# Patient Record
Sex: Female | Born: 1940 | Race: White | Hispanic: No | Smoking: Former smoker
Health system: Southern US, Community
[De-identification: ages and names within clinical notes are randomized; demographics above are authoritative.]

## PROBLEM LIST (undated history)

## (undated) DIAGNOSIS — H269 Unspecified cataract: Secondary | ICD-10-CM

## (undated) DIAGNOSIS — I10 Essential (primary) hypertension: Secondary | ICD-10-CM

## (undated) DIAGNOSIS — H903 Sensorineural hearing loss, bilateral: Secondary | ICD-10-CM

## (undated) DIAGNOSIS — E7800 Pure hypercholesterolemia, unspecified: Secondary | ICD-10-CM

## (undated) DIAGNOSIS — N393 Stress incontinence (female) (male): Secondary | ICD-10-CM

## (undated) DIAGNOSIS — M8580 Other specified disorders of bone density and structure, unspecified site: Secondary | ICD-10-CM

## (undated) DIAGNOSIS — M810 Age-related osteoporosis without current pathological fracture: Secondary | ICD-10-CM

## (undated) DIAGNOSIS — Z8619 Personal history of other infectious and parasitic diseases: Secondary | ICD-10-CM

## (undated) HISTORY — DX: Pure hypercholesterolemia, unspecified: E78.00

## (undated) HISTORY — DX: Other specified disorders of bone density and structure, unspecified site: M85.80

## (undated) HISTORY — PX: OTHER SURGICAL HISTORY: SHX169

## (undated) HISTORY — DX: Sensorineural hearing loss, bilateral: H90.3

## (undated) HISTORY — DX: Personal history of other infectious and parasitic diseases: Z86.19

## (undated) HISTORY — DX: Stress incontinence (female) (male): N39.3

## (undated) HISTORY — PX: BASAL CELL CARCINOMA EXCISION: SHX1214

## (undated) HISTORY — DX: Essential (primary) hypertension: I10

## (undated) HISTORY — DX: Age-related osteoporosis without current pathological fracture: M81.0

## (undated) HISTORY — DX: Unspecified cataract: H26.9

## (undated) HISTORY — PX: CATARACT EXTRACTION: SUR2

## (undated) HISTORY — PX: EYE SURGERY: SHX253

---

## 1997-08-27 ENCOUNTER — Other Ambulatory Visit: Admission: RE | Admit: 1997-08-27 | Discharge: 1997-08-27 | Payer: Self-pay | Admitting: Obstetrics and Gynecology

## 1998-10-21 ENCOUNTER — Other Ambulatory Visit: Admission: RE | Admit: 1998-10-21 | Discharge: 1998-10-21 | Payer: Self-pay | Admitting: Obstetrics and Gynecology

## 1999-10-28 ENCOUNTER — Other Ambulatory Visit: Admission: RE | Admit: 1999-10-28 | Discharge: 1999-10-28 | Payer: Self-pay | Admitting: Obstetrics and Gynecology

## 2000-10-30 ENCOUNTER — Other Ambulatory Visit: Admission: RE | Admit: 2000-10-30 | Discharge: 2000-10-30 | Payer: Self-pay | Admitting: Obstetrics and Gynecology

## 2001-11-15 ENCOUNTER — Other Ambulatory Visit: Admission: RE | Admit: 2001-11-15 | Discharge: 2001-11-15 | Payer: Self-pay | Admitting: Obstetrics and Gynecology

## 2002-11-19 ENCOUNTER — Other Ambulatory Visit: Admission: RE | Admit: 2002-11-19 | Discharge: 2002-11-19 | Payer: Self-pay | Admitting: Obstetrics and Gynecology

## 2003-01-13 ENCOUNTER — Encounter: Admission: RE | Admit: 2003-01-13 | Discharge: 2003-01-13 | Payer: Self-pay | Admitting: Orthopedic Surgery

## 2003-01-13 ENCOUNTER — Encounter: Payer: Self-pay | Admitting: Orthopedic Surgery

## 2003-11-20 ENCOUNTER — Other Ambulatory Visit: Admission: RE | Admit: 2003-11-20 | Discharge: 2003-11-20 | Payer: Self-pay | Admitting: Obstetrics and Gynecology

## 2004-10-05 ENCOUNTER — Encounter: Admission: RE | Admit: 2004-10-05 | Discharge: 2004-10-05 | Payer: Self-pay | Admitting: Family Medicine

## 2004-11-22 ENCOUNTER — Other Ambulatory Visit: Admission: RE | Admit: 2004-11-22 | Discharge: 2004-11-22 | Payer: Self-pay | Admitting: Addiction Medicine

## 2004-11-22 ENCOUNTER — Encounter: Admission: RE | Admit: 2004-11-22 | Discharge: 2004-11-22 | Payer: Self-pay | Admitting: Family Medicine

## 2005-11-24 ENCOUNTER — Other Ambulatory Visit: Admission: RE | Admit: 2005-11-24 | Discharge: 2005-11-24 | Payer: Self-pay | Admitting: Obstetrics and Gynecology

## 2006-06-07 ENCOUNTER — Emergency Department (HOSPITAL_COMMUNITY): Admission: EM | Admit: 2006-06-07 | Discharge: 2006-06-08 | Payer: Self-pay | Admitting: Emergency Medicine

## 2006-11-27 ENCOUNTER — Other Ambulatory Visit: Admission: RE | Admit: 2006-11-27 | Discharge: 2006-11-27 | Payer: Self-pay | Admitting: Obstetrics and Gynecology

## 2007-11-28 ENCOUNTER — Other Ambulatory Visit: Admission: RE | Admit: 2007-11-28 | Discharge: 2007-11-28 | Payer: Self-pay | Admitting: Obstetrics and Gynecology

## 2008-11-28 ENCOUNTER — Ambulatory Visit: Payer: Self-pay | Admitting: Obstetrics and Gynecology

## 2009-12-07 ENCOUNTER — Other Ambulatory Visit: Admission: RE | Admit: 2009-12-07 | Discharge: 2009-12-07 | Payer: Self-pay | Admitting: Obstetrics and Gynecology

## 2009-12-07 ENCOUNTER — Ambulatory Visit: Payer: Self-pay | Admitting: Obstetrics and Gynecology

## 2010-12-10 ENCOUNTER — Encounter: Payer: Self-pay | Admitting: Obstetrics and Gynecology

## 2010-12-10 ENCOUNTER — Ambulatory Visit (INDEPENDENT_AMBULATORY_CARE_PROVIDER_SITE_OTHER): Payer: Medicare Other | Admitting: Obstetrics and Gynecology

## 2010-12-10 VITALS — BP 120/60 | Ht 60.5 in | Wt 174.0 lb

## 2010-12-10 DIAGNOSIS — N951 Menopausal and female climacteric states: Secondary | ICD-10-CM

## 2010-12-10 DIAGNOSIS — N393 Stress incontinence (female) (male): Secondary | ICD-10-CM | POA: Insufficient documentation

## 2010-12-10 DIAGNOSIS — M810 Age-related osteoporosis without current pathological fracture: Secondary | ICD-10-CM | POA: Insufficient documentation

## 2010-12-10 DIAGNOSIS — N952 Postmenopausal atrophic vaginitis: Secondary | ICD-10-CM

## 2010-12-10 DIAGNOSIS — N644 Mastodynia: Secondary | ICD-10-CM

## 2010-12-10 DIAGNOSIS — R82998 Other abnormal findings in urine: Secondary | ICD-10-CM

## 2010-12-10 DIAGNOSIS — M8580 Other specified disorders of bone density and structure, unspecified site: Secondary | ICD-10-CM | POA: Insufficient documentation

## 2010-12-10 DIAGNOSIS — Z780 Asymptomatic menopausal state: Secondary | ICD-10-CM

## 2010-12-10 DIAGNOSIS — M899 Disorder of bone, unspecified: Secondary | ICD-10-CM

## 2010-12-10 DIAGNOSIS — R32 Unspecified urinary incontinence: Secondary | ICD-10-CM

## 2010-12-10 NOTE — Progress Notes (Signed)
The patient came back to see me today for further followup. The first thing we discussed is that she's had for several months pain in her right breast at around 9:00. It seems to come and go and has not been associated with any lump. She is due for her yearly mammogram today. She continues to have loss of urine with coughing laughing and sneezing and is ready to address it surgically. She has not seen a urologist yet. She is having no vaginal bleeding. 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 She does have low bone mass. She has a normal FRAX risk. Her bone densities remained stable. She is not do for followup until  next year. She has mild menopausal symptoms that don't require intervention. She is being treated for an elevated cholesterol and hypertension by her internist.  Past medical history, family history, and social history reviewed and in epic record.  ROS: 9 point systems reviewed done and there are no pertinent positives and negatives except as noted above.  Physical examination: HEENT within normal limits. Neck: Thyroid not large. No masses. Supraclavicular nodes: not enlarged. Breasts: Examined in both sitting midline position. No skin changes and no masses. Abdomen: Soft no guarding rebound or masses or hernia. Pelvic: External: Within normal limits. BUS: Within normal limits. Vaginal:within normal limits. Poor estrogen effect. No evidence of cystocele rectocele or enterocele. Cervix: clean. Uterus: Normal size and shape. Adnexa: No masses. Rectovaginal exam: Confirmatory and negative. Extremities: Within normal limits.  Assessment: 1. Urinary stress incontinence 2. Atrophic vaginitis 3. Menopausal symptoms 4. Mastodynia 5.Osteopenia  Plan: 1. Referred to Dr. Roselee Nova for assessment of USI 2. No treatment currently for menopausal symptoms 3. Followup bone density next year 4. Patient will have her mammogram today at [facility].  She will tell them about the discomfort she's having and have them do a diagnostic mammogram. They will call for an order if needed.

## 2010-12-13 ENCOUNTER — Encounter: Payer: Self-pay | Admitting: Obstetrics and Gynecology

## 2011-12-15 ENCOUNTER — Encounter: Payer: BC Managed Care – PPO | Admitting: Obstetrics and Gynecology

## 2012-01-12 ENCOUNTER — Encounter: Payer: Self-pay | Admitting: Obstetrics and Gynecology

## 2012-01-12 ENCOUNTER — Ambulatory Visit (INDEPENDENT_AMBULATORY_CARE_PROVIDER_SITE_OTHER): Payer: Medicare Other | Admitting: Obstetrics and Gynecology

## 2012-01-12 VITALS — BP 126/74 | Ht 60.0 in | Wt 162.0 lb

## 2012-01-12 DIAGNOSIS — R3915 Urgency of urination: Secondary | ICD-10-CM

## 2012-01-12 DIAGNOSIS — Z8619 Personal history of other infectious and parasitic diseases: Secondary | ICD-10-CM | POA: Insufficient documentation

## 2012-01-12 DIAGNOSIS — M949 Disorder of cartilage, unspecified: Secondary | ICD-10-CM

## 2012-01-12 DIAGNOSIS — N952 Postmenopausal atrophic vaginitis: Secondary | ICD-10-CM

## 2012-01-12 DIAGNOSIS — E7800 Pure hypercholesterolemia, unspecified: Secondary | ICD-10-CM | POA: Insufficient documentation

## 2012-01-12 DIAGNOSIS — I10 Essential (primary) hypertension: Secondary | ICD-10-CM | POA: Insufficient documentation

## 2012-01-12 DIAGNOSIS — R350 Frequency of micturition: Secondary | ICD-10-CM

## 2012-01-12 DIAGNOSIS — E785 Hyperlipidemia, unspecified: Secondary | ICD-10-CM | POA: Insufficient documentation

## 2012-01-12 DIAGNOSIS — H269 Unspecified cataract: Secondary | ICD-10-CM | POA: Insufficient documentation

## 2012-01-12 DIAGNOSIS — M8580 Other specified disorders of bone density and structure, unspecified site: Secondary | ICD-10-CM

## 2012-01-12 DIAGNOSIS — N393 Stress incontinence (female) (male): Secondary | ICD-10-CM

## 2012-01-12 NOTE — Progress Notes (Signed)
Patient came to see me today for further followup. She continues to have loss of urine with coughing laughing and sneezing. She is doing Kegel exercises. We had previously referred her to a urologist to consider a sling procedure. She never had a chance to go because she was taking care of her husband. He done earlier this year of Alzheimer's. The patient does have urinary frequency and nocturia x1. She has occasional urgency of urination. She is not inconvenience by the above. She is having no vaginal bleeding. She is having no pelvic pain. She had her mammogram this morning. She's never had an abnormal Pap smear. Her last Pap was 2011. She does have osteopenia without an elevated fracture risk. She has not had any fractures. She is not having menopausal symptoms. She is not sexually active and is unaware of atrophic vaginitis.  ROS: 12 system review done. Pertinent positives above. Other positives include hypertension, cataracts and shingles.  Physical examination:[name] Julian Reil present. HEENT within normal limits. Neck: Thyroid not large. No masses. Supraclavicular nodes: not enlarged. Breasts: Examined in both sitting and lying  position. No skin changes and no masses. Abdomen: Soft no guarding rebound or masses or hernia. Pelvic: External: Within normal limits. BUS: Within normal limits. Vaginal:within normal limits. Poor estrogen effect. No evidence of cystocele rectocele or enterocele. Cervix: clean. Uterus: Normal size and shape. Adnexa: No masses. Rectovaginal exam: Confirmatory and negative. Extremities: Within normal limits.  Assessment: #1. Osteopenia #2. Atrophic vaginitis #3. Urinary stress incontinence #4. Urinary frequency and urgency  Plan: Continue yearly mammograms. Bone density with  next mammogram. 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 Urological consult to consider a sling procedure. The new Pap smear guidelines were discussed with the patient. No pap  done.

## 2012-01-12 NOTE — Patient Instructions (Signed)
Schedule  bone density with next mammogram.

## 2012-01-13 ENCOUNTER — Encounter: Payer: Self-pay | Admitting: Obstetrics and Gynecology

## 2012-12-17 DIAGNOSIS — M8580 Other specified disorders of bone density and structure, unspecified site: Secondary | ICD-10-CM

## 2012-12-17 HISTORY — DX: Other specified disorders of bone density and structure, unspecified site: M85.80

## 2013-01-02 ENCOUNTER — Telehealth: Payer: Self-pay | Admitting: *Deleted

## 2013-01-02 NOTE — Telephone Encounter (Signed)
Pt called requesting bone density order faxed to [facility], she has mammogram scheduled on 01/14/13 and would to have dexa done same day. Order faxed. Pt informed.

## 2013-01-14 ENCOUNTER — Ambulatory Visit (INDEPENDENT_AMBULATORY_CARE_PROVIDER_SITE_OTHER): Payer: Medicare Other | Admitting: Gynecology

## 2013-01-14 ENCOUNTER — Encounter: Payer: Self-pay | Admitting: Gynecology

## 2013-01-14 VITALS — BP 124/80 | Ht 60.0 in | Wt 171.0 lb

## 2013-01-14 DIAGNOSIS — M899 Disorder of bone, unspecified: Secondary | ICD-10-CM

## 2013-01-14 DIAGNOSIS — N952 Postmenopausal atrophic vaginitis: Secondary | ICD-10-CM

## 2013-01-14 DIAGNOSIS — M8580 Other specified disorders of bone density and structure, unspecified site: Secondary | ICD-10-CM

## 2013-01-14 DIAGNOSIS — R32 Unspecified urinary incontinence: Secondary | ICD-10-CM

## 2013-01-14 NOTE — Progress Notes (Signed)
Heather Andrews [birth year] [id number]        72 y.o.  [id number] for followup exam.  Former patient of Dr. Eda Paschal. Several issues noted below.  Past medical history,surgical history, medications, allergies, family history and social history were all reviewed and documented in the EPIC chart.  ROS:  Performed and pertinent positives and negatives are included in the history, assessment and plan .  Exam: Heather Andrews Filed Vitals:   01/14/13 1358  BP: 124/80  Height: 5' (1.524 m)  Weight: 171 lb (77.565 kg)   General appearance  Normal Skin grossly normal Head/Neck normal with no cervical or supraclavicular adenopathy thyroid normal Lungs  clear Cardiac RR, without RMG Abdominal  soft, nontender, without masses, organomegaly or hernia Breasts  examined lying and sitting without masses, retractions, discharge or axillary adenopathy. Pelvic  Ext/BUS/vagina  normal with atrophic changes  Cervix  normal with atrophic changes  Uterus  anteverted, normal size, shape and contour, midline and mobile nontender   Adnexa  Without masses or tenderness    Anus and perineum  normal   Rectovaginal  normal sphincter tone without palpated masses or tenderness.    Assessment/Plan:  72 y.o. [id number] female for followup exam.   1. Urinary incontinence. Patient has a mixed pattern historically with loss of urine with coughing sneezing but also some urgency symptoms. Patient also notes some constant dribbling. Will refer to urology for evaluation and treatment. Check urinalysis today. 2. Osteopenia. DEXA 2011 with T score -1.8 FRAX 15%/1.9%. Repeat DEXA now and patient has scheduled today. Increase calcium and vitamin D reviewed. 3. Mammography reportedly scheduled today. Continue with annual mammography. SBE monthly reviewed. 4. Pap smear 2011. No Pap smear done today. No history of abnormal Pap smears previously. Review current screening guidelines. Options discussed renal together are less frequent  screening intervals reviewed. We'll readdress on annual basis. 5. Colonoscopy never. Strongly recommended that she schedule this and she understands my recommendations. Benefits of precancerous polyp removals and early detection discussed. Number is given to call to schedule. 6. Health maintenance. The blood work done as its reportedly done through her primary physician's office. Followup for studies as above otherwise annually.  Note: This document was prepared with digital dictation and possible smart phrase technology. Any transcriptional errors that result from this process are unintentional.   Heather Lords MD, 2:41 PM 01/14/2013

## 2013-01-14 NOTE — Patient Instructions (Signed)
Schedule colonoscopy with [facility] at [phone] or [facility]ology at [phone] Office will call to schedule urology appt

## 2013-01-15 ENCOUNTER — Telehealth: Payer: Self-pay | Admitting: *Deleted

## 2013-01-15 LAB — URINALYSIS W MICROSCOPIC + REFLEX CULTURE
Bacteria, UA: NONE SEEN
Bilirubin Urine: NEGATIVE
Glucose, UA: NEGATIVE mg/dL
Hgb urine dipstick: NEGATIVE
Ketones, ur: NEGATIVE mg/dL
Protein, ur: NEGATIVE mg/dL
Squamous Epithelial / LPF: NONE SEEN

## 2013-01-15 NOTE — Telephone Encounter (Signed)
Appointment on 01/28/13 @ 9:45 am with Dr.McDiarmid, notes faxed. Pt informed with this as well.

## 2013-01-15 NOTE — Telephone Encounter (Signed)
Message copied by Aura Camps on Tue Jan 15, 2013 10:17 AM ------      Message from: Dara Lords      Created: Mon Jan 14, 2013  2:46 PM       Schedule appointment with urology, reference urinary incontinence. Doctors Patsi Sears, [place] ------

## 2013-01-17 ENCOUNTER — Encounter: Payer: Self-pay | Admitting: Gynecology

## 2013-01-21 ENCOUNTER — Encounter: Payer: Self-pay | Admitting: Gynecology

## 2013-01-21 ENCOUNTER — Telehealth: Payer: Self-pay | Admitting: Gynecology

## 2013-01-21 NOTE — Telephone Encounter (Signed)
Pt informed with the below note, transferred to appointment desk 

## 2013-01-21 NOTE — Telephone Encounter (Signed)
Ask patient to make appointment to discuss her bone density results. Nothing bad but there is an issue I want to review with her.

## 2013-01-29 ENCOUNTER — Encounter: Payer: Self-pay | Admitting: Gynecology

## 2013-01-29 ENCOUNTER — Ambulatory Visit (INDEPENDENT_AMBULATORY_CARE_PROVIDER_SITE_OTHER): Payer: Medicare Other | Admitting: Gynecology

## 2013-01-29 DIAGNOSIS — M8580 Other specified disorders of bone density and structure, unspecified site: Secondary | ICD-10-CM

## 2013-01-29 DIAGNOSIS — M899 Disorder of bone, unspecified: Secondary | ICD-10-CM

## 2013-01-29 NOTE — Patient Instructions (Signed)
Office will call you to arrange Prolia injections.

## 2013-01-29 NOTE — Progress Notes (Signed)
Patient presents to discuss her most recent DEXA report 12/2012. T score -2.0 FRAX 18%/3.5%. It did not appear to be significant decline from her prior DEXA. In review of this study L4 showed a greater than 1 difference from L3 and probably should have been discarded with L1-3 reported. Still would not exceed the -2 on the hip. I discussed her increased fracture risk at the hip of 3.5% exceeding the 3% recommended treatment threshold.  She had a number of years ago transiently took bisphosphonates for several months but had unacceptable GERD and abdominal pain. The options to treat now with medication such as Prolia versus waiting and restarting her in 2 years discussed. Issues of hip fracture and devastating sequelae also reviewed. Side effects/risks to include osteonecrosis of the jaw, atypical fractures rashes and infections reviewed. After a lengthy discussion the patient wants to go ahead and initiate treatment and we'll start with Prolia every 6 months for 2 years. Repeat her DEXA and then go from there.

## 2013-03-28 ENCOUNTER — Telehealth: Payer: Self-pay | Admitting: *Deleted

## 2013-03-28 NOTE — Telephone Encounter (Signed)
Pt informed and wants to proceed with Prolia. Pt apt Mon 12/15 10am. KW

## 2013-03-28 NOTE — Telephone Encounter (Signed)
I left pt a message today regarding Prolia benefits. Medicare does pay for the injection after $147 deductible met and then 20% co insurance, secondary picks up coinsurance minus the deductible if not met but it is met. If pt wants to proceed we will make an apt. KW CMA

## 2013-04-01 ENCOUNTER — Ambulatory Visit (INDEPENDENT_AMBULATORY_CARE_PROVIDER_SITE_OTHER): Payer: Medicare Other | Admitting: *Deleted

## 2013-04-01 DIAGNOSIS — M810 Age-related osteoporosis without current pathological fracture: Secondary | ICD-10-CM

## 2013-04-01 MED ORDER — DENOSUMAB 60 MG/ML ~~LOC~~ SOLN
60.0000 mg | Freq: Once | SUBCUTANEOUS | Status: AC
  Administered 2013-04-01: 60 mg via SUBCUTANEOUS

## 2013-07-19 ENCOUNTER — Emergency Department (HOSPITAL_COMMUNITY): Payer: Medicare Other

## 2013-07-19 ENCOUNTER — Emergency Department (HOSPITAL_COMMUNITY)
Admission: EM | Admit: 2013-07-19 | Discharge: 2013-07-19 | Disposition: A | Discharge status: Home / Self Care | Payer: Medicare Other | Attending: Emergency Medicine | Admitting: Emergency Medicine

## 2013-07-19 ENCOUNTER — Encounter (HOSPITAL_COMMUNITY): Payer: Self-pay | Admitting: Emergency Medicine

## 2013-07-19 DIAGNOSIS — S6990XA Unspecified injury of unspecified wrist, hand and finger(s), initial encounter: Secondary | ICD-10-CM | POA: Insufficient documentation

## 2013-07-19 DIAGNOSIS — M25532 Pain in left wrist: Secondary | ICD-10-CM

## 2013-07-19 DIAGNOSIS — E7800 Pure hypercholesterolemia, unspecified: Secondary | ICD-10-CM | POA: Insufficient documentation

## 2013-07-19 DIAGNOSIS — H269 Unspecified cataract: Secondary | ICD-10-CM | POA: Insufficient documentation

## 2013-07-19 DIAGNOSIS — M25512 Pain in left shoulder: Secondary | ICD-10-CM

## 2013-07-19 DIAGNOSIS — Z8739 Personal history of other diseases of the musculoskeletal system and connective tissue: Secondary | ICD-10-CM | POA: Insufficient documentation

## 2013-07-19 DIAGNOSIS — S59909A Unspecified injury of unspecified elbow, initial encounter: Secondary | ICD-10-CM | POA: Insufficient documentation

## 2013-07-19 DIAGNOSIS — Z8619 Personal history of other infectious and parasitic diseases: Secondary | ICD-10-CM | POA: Insufficient documentation

## 2013-07-19 DIAGNOSIS — Z79899 Other long term (current) drug therapy: Secondary | ICD-10-CM | POA: Insufficient documentation

## 2013-07-19 DIAGNOSIS — I10 Essential (primary) hypertension: Secondary | ICD-10-CM | POA: Insufficient documentation

## 2013-07-19 DIAGNOSIS — Z87891 Personal history of nicotine dependence: Secondary | ICD-10-CM | POA: Insufficient documentation

## 2013-07-19 DIAGNOSIS — S4980XA Other specified injuries of shoulder and upper arm, unspecified arm, initial encounter: Secondary | ICD-10-CM | POA: Insufficient documentation

## 2013-07-19 DIAGNOSIS — S59919A Unspecified injury of unspecified forearm, initial encounter: Secondary | ICD-10-CM

## 2013-07-19 DIAGNOSIS — T07XXXA Unspecified multiple injuries, initial encounter: Secondary | ICD-10-CM

## 2013-07-19 DIAGNOSIS — S46909A Unspecified injury of unspecified muscle, fascia and tendon at shoulder and upper arm level, unspecified arm, initial encounter: Secondary | ICD-10-CM | POA: Insufficient documentation

## 2013-07-19 DIAGNOSIS — Y92410 Unspecified street and highway as the place of occurrence of the external cause: Secondary | ICD-10-CM | POA: Insufficient documentation

## 2013-07-19 DIAGNOSIS — S51809A Unspecified open wound of unspecified forearm, initial encounter: Secondary | ICD-10-CM | POA: Insufficient documentation

## 2013-07-19 DIAGNOSIS — IMO0002 Reserved for concepts with insufficient information to code with codable children: Secondary | ICD-10-CM | POA: Insufficient documentation

## 2013-07-19 DIAGNOSIS — Y9389 Activity, other specified: Secondary | ICD-10-CM | POA: Insufficient documentation

## 2013-07-19 DIAGNOSIS — Z8742 Personal history of other diseases of the female genital tract: Secondary | ICD-10-CM | POA: Insufficient documentation

## 2013-07-19 MED ORDER — HYDROCODONE-ACETAMINOPHEN 5-325 MG PO TABS: 1.0000 | ORAL_TABLET | ORAL | Status: DC | PRN
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
## 2013-07-19 NOTE — Discharge Instructions (Signed)
Contusion A contusion is a deep bruise. Contusions are the result of an injury that caused bleeding under the skin. The contusion may turn blue, purple, or yellow. Minor injuries will give you a painless contusion, but more severe contusions may stay painful and swollen for a few weeks.  CAUSES  A contusion is usually caused by a blow, trauma, or direct force to an area of the body. SYMPTOMS   Swelling and redness of the injured area.  Bruising of the injured area.  Tenderness and soreness of the injured area.  Pain. DIAGNOSIS  The diagnosis can be made by taking a history and physical exam. An X-ray, CT scan, or MRI may be needed to determine if there were any associated injuries, such as fractures. TREATMENT  Specific treatment will depend on what area of the body was injured. In general, the best treatment for a contusion is resting, icing, elevating, and applying cold compresses to the injured area. Over-the-counter medicines may also be recommended for pain control. Ask your caregiver what the best treatment is for your contusion. HOME CARE INSTRUCTIONS   Put ice on the injured area.  Put ice in a plastic bag.  Place a towel between your skin and the bag.  Leave the ice on for 15-20 minutes, 03-04 times a day.  Only take over-the-counter or prescription medicines for pain, discomfort, or fever as directed by your caregiver. Your caregiver may recommend avoiding anti-inflammatory medicines (aspirin, ibuprofen, and naproxen) for 48 hours because these medicines may increase bruising.  Rest the injured area.  If possible, elevate the injured area to reduce swelling. SEEK IMMEDIATE MEDICAL CARE IF:   You have increased bruising or swelling.  You have pain that is getting worse.  Your swelling or pain is not relieved with medicines. MAKE SURE YOU:   Understand these instructions.  Will watch your condition.  Will get help right away if you are not doing well or get  worse. Document Released: 01/12/2005 Document Revised: 06/27/2011 Document Reviewed: 02/07/2011 [facility] Patient Information 2014 [place], [place].  Chest Wall Pain Chest wall pain is pain felt in or around the chest bones and muscles. It may take up to 6 weeks to get better. It may take longer if you are active. Chest wall pain can happen on its own. Other times, things like germs, injury, coughing, or exercise can cause the pain. HOME CARE   Avoid activities that make you tired or cause pain. Try not to use your chest, belly (abdominal), or side muscles. Do not use heavy weights.  Put ice on the sore area.  Put ice in a plastic bag.  Place a towel between your skin and the bag.  Leave the ice on for 15-20 minutes for the first 2 days.  Only take medicine as told by your doctor. GET HELP RIGHT AWAY IF:   You have more pain or are very uncomfortable.  You have a fever.  Your chest pain gets worse.  You have new problems.  You feel sick to your stomach (nauseous) or throw up (vomit).  You start to sweat or feel lightheaded.  You have a cough with mucus (phlegm).  You cough up blood. MAKE SURE YOU:   Understand these instructions.  Will watch your condition.  Will get help right away if you are not doing well or get worse. Document Released: 09/21/2007 Document Revised: 06/27/2011 Document Reviewed: 11/29/2010 [facility] Patient Information 2014 [place], [place].  Motor Vehicle Collision  It is common to have  multiple bruises and sore muscles after a motor vehicle collision (MVC). These tend to feel worse for the first 24 hours. You may have the most stiffness and soreness over the first several hours. You may also feel worse when you wake up the first morning after your collision. After this point, you will usually begin to improve with each day. The speed of improvement often depends on the severity of the collision, the number of injuries, and the location and nature of  these injuries. HOME CARE INSTRUCTIONS   Put ice on the injured area.  Put ice in a plastic bag.  Place a towel between your skin and the bag.  Leave the ice on for 15-20 minutes, 03-04 times a day.  Drink enough fluids to keep your urine clear or pale yellow. Do not drink alcohol.  Take a warm shower or bath once or twice a day. This will increase blood flow to sore muscles.  You may return to activities as directed by your caregiver. Be careful when lifting, as this may aggravate neck or back pain.  Only take over-the-counter or prescription medicines for pain, discomfort, or fever as directed by your caregiver. Do not use aspirin. This may increase bruising and bleeding. SEEK IMMEDIATE MEDICAL CARE IF:  You have numbness, tingling, or weakness in the arms or legs.  You develop severe headaches not relieved with medicine.  You have severe neck pain, especially tenderness in the middle of the back of your neck.  You have changes in bowel or bladder control.  There is increasing pain in any area of the body.  You have shortness of breath, lightheadedness, dizziness, or fainting.  You have chest pain.  You feel sick to your stomach (nauseous), throw up (vomit), or sweat.  You have increasing abdominal discomfort.  There is blood in your urine, stool, or vomit.  You have pain in your shoulder (shoulder strap areas).  You feel your symptoms are getting worse. MAKE SURE YOU:   Understand these instructions.  Will watch your condition.  Will get help right away if you are not doing well or get worse. Document Released: 04/04/2005 Document Revised: 06/27/2011 Document Reviewed: 09/01/2010 [facility] Patient Information 2014 [place], [place]. RICE: Routine Care for Injuries The routine care of many injuries includes Rest, Ice, Compression, and Elevation (RICE). HOME CARE INSTRUCTIONS  Rest is needed to allow your body to heal. Routine activities can usually be resumed  when comfortable. Injured tendons and bones can take up to 6 weeks to heal. Tendons are the cord-like structures that attach muscle to bone.  Ice following an injury helps keep the swelling down and reduces pain.  Put ice in a plastic bag.  Place a towel between your skin and the bag.  Leave the ice on for 15-20 minutes, 03-04 times a day. Do this while awake, for the first 24 to 48 hours. After that, continue as directed by your caregiver.  Compression helps keep swelling down. It also gives support and helps with discomfort. If an elastic bandage has been applied, it should be removed and reapplied every 3 to 4 hours. It should not be applied tightly, but firmly enough to keep swelling down. Watch fingers or toes for swelling, bluish discoloration, coldness, numbness, or excessive pain. If any of these problems occur, remove the bandage and reapply loosely. Contact your caregiver if these problems continue.  Elevation helps reduce swelling and decreases pain. With extremities, such as the arms, hands, legs, and feet, the injured  area should be placed near or above the level of the heart, if possible. SEEK IMMEDIATE MEDICAL CARE IF:  You have persistent pain and swelling.  You develop redness, numbness, or unexpected weakness.  Your symptoms are getting worse rather than improving after several days. These symptoms may indicate that further evaluation or further X-rays are needed. Sometimes, X-rays may not show a small broken bone (fracture) until 1 week or 10 days later. Make a follow-up appointment with your caregiver. Ask when your X-ray results will be ready. Make sure you get your X-ray results. Document Released: 07/17/2000 Document Revised: 06/27/2011 Document Reviewed: 09/03/2010 [facility] Patient Information 2014 [place], [place].
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
## 2013-09-30 ENCOUNTER — Ambulatory Visit (INDEPENDENT_AMBULATORY_CARE_PROVIDER_SITE_OTHER): Payer: Medicare Other | Admitting: Gynecology

## 2013-09-30 DIAGNOSIS — M810 Age-related osteoporosis without current pathological fracture: Secondary | ICD-10-CM

## 2013-09-30 MED ORDER — DENOSUMAB 60 MG/ML ~~LOC~~ SOLN
60.0000 mg | Freq: Once | SUBCUTANEOUS | Status: AC
Start: 2013-09-30 — End: 2013-09-30
  Administered 2013-09-30: 60 mg via SUBCUTANEOUS

## 2014-01-16 ENCOUNTER — Encounter: Payer: Medicare Other | Admitting: Gynecology

## 2014-01-17 ENCOUNTER — Encounter: Payer: Medicare Other | Admitting: Gynecology

## 2014-02-17 ENCOUNTER — Encounter (HOSPITAL_COMMUNITY): Payer: Self-pay | Admitting: Emergency Medicine

## 2014-02-26 ENCOUNTER — Encounter: Payer: Self-pay | Admitting: Gynecology

## 2014-03-03 ENCOUNTER — Encounter: Payer: Self-pay | Admitting: Gynecology

## 2014-03-03 ENCOUNTER — Ambulatory Visit (INDEPENDENT_AMBULATORY_CARE_PROVIDER_SITE_OTHER): Payer: Medicare Other | Admitting: Gynecology

## 2014-03-03 VITALS — BP 122/76 | Ht 60.25 in | Wt 151.4 lb

## 2014-03-03 DIAGNOSIS — R32 Unspecified urinary incontinence: Secondary | ICD-10-CM

## 2014-03-03 DIAGNOSIS — N952 Postmenopausal atrophic vaginitis: Secondary | ICD-10-CM

## 2014-03-03 DIAGNOSIS — M8580 Other specified disorders of bone density and structure, unspecified site: Secondary | ICD-10-CM

## 2014-03-03 NOTE — Patient Instructions (Signed)
Schedule colonoscopy with  gastroenterology at [phone] or [facility] at [phone]  You may obtain a copy of any labs that were done today by logging onto MyChart as outlined in the instructions provided with your AVS (after visit summary). The office will not call with normal lab results but certainly if there are any significant abnormalities then we will contact you.   Health Maintenance, Female A healthy lifestyle and preventative care can promote health and wellness.  Maintain regular health, dental, and eye exams.  Eat a healthy diet. Foods like vegetables, fruits, whole grains, low-fat dairy products, and lean protein foods contain the nutrients you need without too many calories. Decrease your intake of foods high in solid fats, added sugars, and salt. Get information about a proper diet from your caregiver, if necessary.  Regular physical exercise is one of the most important things you can do for your health. Most adults should get at least 150 minutes of moderate-intensity exercise (any activity that increases your heart rate and causes you to sweat) each week. In addition, most adults need muscle-strengthening exercises on 2 or more days a week.   Maintain a healthy weight. The body mass index (BMI) is a screening tool to identify possible weight problems. It provides an estimate of body fat based on height and weight. Your caregiver can help determine your BMI, and can help you achieve or maintain a healthy weight. For adults 20 years and older:  A BMI below 18.5 is considered underweight.  A BMI of 18.5 to 24.9 is normal.  A BMI of 25 to 29.9 is considered overweight.  A BMI of 30 and above is considered obese.  Maintain normal blood lipids and cholesterol by exercising and minimizing your intake of saturated fat. Eat a balanced diet with plenty of fruits and vegetables. Blood tests for lipids and cholesterol should begin at age 20 and be repeated every  5 years. If your lipid or cholesterol levels are high, you are over 50, or you are a high risk for heart disease, you may need your cholesterol levels checked more frequently.Ongoing high lipid and cholesterol levels should be treated with medicines if diet and exercise are not effective.  If you smoke, find out from your caregiver how to quit. If you do not use tobacco, do not start.  Lung cancer screening is recommended for adults aged 55 80 years who are at high risk for developing lung cancer because of a history of smoking. Yearly low-dose computed tomography (CT) is recommended for people who have at least a 30-pack-year history of smoking and are a current smoker or have quit within the past 15 years. A pack year of smoking is smoking an average of 1 pack of cigarettes a day for 1 year (for example: 1 pack a day for 30 years or 2 packs a day for 15 years). Yearly screening should continue until the smoker has stopped smoking for at least 15 years. Yearly screening should also be stopped for people who develop a health problem that would prevent them from having lung cancer treatment.  If you are pregnant, do not drink alcohol. If you are breastfeeding, be very cautious about drinking alcohol. If you are not pregnant and choose to drink alcohol, do not exceed 1 drink per day. One drink is considered to be 12 ounces (355 mL) of beer, 5 ounces (148 mL) of wine, or 1.5 ounces (44 mL) of liquor.  Avoid use of street drugs. Do not share needles   with anyone. Ask for help if you need support or instructions about stopping the use of drugs.  High blood pressure causes heart disease and increases the risk of stroke. Blood pressure should be checked at least every 1 to 2 years. Ongoing high blood pressure should be treated with medicines, if weight loss and exercise are not effective.  If you are 55 to 72 years old, ask your caregiver if you should take aspirin to prevent strokes.  Diabetes screening  involves taking a blood sample to check your fasting blood sugar level. This should be done once every 3 years, after age 45, if you are within normal weight and without risk factors for diabetes. Testing should be considered at a younger age or be carried out more frequently if you are overweight and have at least 1 risk factor for diabetes.  Breast cancer screening is essential preventative care for women. You should practice "breast self-awareness." This means understanding the normal appearance and feel of your breasts and may include breast self-examination. Any changes detected, no matter how small, should be reported to a caregiver. Women in their 20s and 30s should have a clinical breast exam (CBE) by a caregiver as part of a regular health exam every 1 to 3 years. After age 40, women should have a CBE every year. Starting at age 40, women should consider having a mammogram (breast X-ray) every year. Women who have a family history of breast cancer should talk to their caregiver about genetic screening. Women at a high risk of breast cancer should talk to their caregiver about having an MRI and a mammogram every year.  Breast cancer gene (BRCA)-related cancer risk assessment is recommended for women who have family members with BRCA-related cancers. BRCA-related cancers include breast, ovarian, tubal, and peritoneal cancers. Having family members with these cancers may be associated with an increased risk for harmful changes (mutations) in the breast cancer genes BRCA1 and BRCA2. Results of the assessment will determine the need for genetic counseling and BRCA1 and BRCA2 testing.  The Pap test is a screening test for cervical cancer. Women should have a Pap test starting at age 21. Between ages 21 and 29, Pap tests should be repeated every 2 years. Beginning at age 30, you should have a Pap test every 3 years as long as the past 3 Pap tests have been normal. If you had a hysterectomy for a problem that  was not cancer or a condition that could lead to cancer, then you no longer need Pap tests. If you are between ages 65 and 70, and you have had normal Pap tests going back 10 years, you no longer need Pap tests. If you have had past treatment for cervical cancer or a condition that could lead to cancer, you need Pap tests and screening for cancer for at least 20 years after your treatment. If Pap tests have been discontinued, risk factors (such as a new sexual partner) need to be reassessed to determine if screening should be resumed. Some women have medical problems that increase the chance of getting cervical cancer. In these cases, your caregiver may recommend more frequent screening and Pap tests.  The human papillomavirus (HPV) test is an additional test that may be used for cervical cancer screening. The HPV test looks for the virus that can cause the cell changes on the cervix. The cells collected during the Pap test can be tested for HPV. The HPV test could be used to screen women aged   aged 60 years and older, and should be used in women of any age who have unclear Pap test results. After the age of 2, women should have HPV testing at the same frequency as a Pap test.  Colorectal cancer can be detected and often prevented. Most routine colorectal cancer screening begins at the age of 53 and continues through age 70. However, your caregiver may recommend screening at an earlier age if you have risk factors for colon cancer. On a yearly basis, your caregiver may provide home test kits to check for hidden blood in the stool. Use of a small camera at the end of a tube, to directly examine the colon (sigmoidoscopy or colonoscopy), can detect the earliest forms of colorectal cancer. Talk to your caregiver about this at age 51, when routine screening begins. Direct examination of the colon should be repeated every 5 to 10 years through age 29, unless early forms of pre-cancerous polyps or small growths  are found.  Hepatitis C blood testing is recommended for all people born from [birth year] through 1964 and any individual with known risks for hepatitis C.  Practice safe sex. Use condoms and avoid high-risk sexual practices to reduce the spread of sexually transmitted infections (STIs). Sexually active women aged 42 and younger should be checked for Chlamydia, which is a common sexually transmitted infection. Older women with new or multiple partners should also be tested for Chlamydia. Testing for other STIs is recommended if you are sexually active and at increased risk.  Osteoporosis is a disease in which the bones lose minerals and strength with aging. This can result in serious bone fractures. The risk of osteoporosis can be identified using a bone density scan. Women ages 44 and over and women at risk for fractures or osteoporosis should discuss screening with their caregivers. Ask your caregiver whether you should be taking a calcium supplement or vitamin D to reduce the rate of osteoporosis.  Menopause can be associated with physical symptoms and risks. Hormone replacement therapy is available to decrease symptoms and risks. You should talk to your caregiver about whether hormone replacement therapy is right for you.  Use sunscreen. Apply sunscreen liberally and repeatedly throughout the day. You should seek shade when your shadow is shorter than you. Protect yourself by wearing long sleeves, pants, a wide-brimmed hat, and sunglasses year round, whenever you are outdoors.  Notify your caregiver of new moles or changes in moles, especially if there is a change in shape or color. Also notify your caregiver if a mole is larger than the size of a pencil eraser.  Stay current with your immunizations. Document Released: 10/18/2010 Document Revised: 07/30/2012 Document Reviewed: 10/18/2010 [facility] Patient Information 2014 [place].

## 2014-03-03 NOTE — Progress Notes (Signed)
Heather Andrews 02/01/1941 [id number]        73 y.o.  G3P3003 for follow up exam. Several issues noted below.  Past medical history,surgical history, problem list, medications, allergies, family history and social history were all reviewed and documented as reviewed in the EPIC chart.  ROS:  12 system ROS performed with pertinent positives and negatives included in the history, assessment and plan.   Additional significant findings :  none   Exam: Heather Andrews Filed Vitals:   03/03/14 1405  BP: 122/76  Height: 5' 0.25" (1.53 m)  Weight: 151 lb 6.4 oz (68.675 kg)   General appearance:  Normal affect, orientation and appearance. Skin: Grossly normal HEENT: Without gross lesions.  No cervical or supraclavicular adenopathy. Thyroid normal.  Lungs:  Clear without wheezing, rales or rhonchi Cardiac: RR, without RMG Abdominal:  Soft, nontender, without masses, guarding, rebound, organomegaly or hernia Breasts:  Examined lying and sitting without masses, retractions, discharge or axillary adenopathy. Pelvic:  Ext/BUS/vagina with generalized atrophic changes  Cervix atrophic  Uterus anteverted, normal size, shape and contour, midline and mobile nontender   Adnexa  Without masses or tenderness    Anus and perineum  Normal   Rectovaginal  Normal sphincter tone without palpated masses or tenderness.    Assessment/Plan:  73 y.o. 293P3003 female for annual exam.   1. Postmenopausal/atrophic genital changes.without significant symptoms of hot flashes, night sweats, vaginal dryness. Not sexually active. No vaginal bleeding. Continue to monitor. Report any vaginal bleeding. 2. Urinary incontinence. Has been evaluated by urology. Has got undergone physical therapy. Is on Myrbetriq doing well. Will continue. Check urinalysis today. 3. Osteopenia.  DEXA 12/2012 T score -2.0. FRAX 18%/3.5%. Finished first year of Prolia (see 01/29/2013 note). Will receive her second year this coming year and follow up  DEXA next year. Increased calcium vitamin D reviewed. 4. Colonoscopy never. Both myself and her primary physician have strongly recommended her to schedule this and she said that she will do this after the first of the year. 5. Pap smear 2011. No Pap smear done today. No history of significant abnormal Pap smears. Per current screening guidelines we both agree to stop screening and she is over the age 72. 6. Mammography 02/2014. Continue with annual mammography. SBE monthly reviewed. 7. Health maintenance. No blood work done as this is done at her primary physician's office. Follow up 1 year, sooner as needed.     Heather Andrews,Heather Andrews P MD, 2:29 PM 03/03/2014

## 2014-03-04 LAB — URINALYSIS W MICROSCOPIC + REFLEX CULTURE
BILIRUBIN URINE: NEGATIVE
Bacteria, UA: NONE SEEN
Casts: NONE SEEN
Crystals: NONE SEEN
Glucose, UA: NEGATIVE mg/dL
Hgb urine dipstick: NEGATIVE
Ketones, ur: NEGATIVE mg/dL
Leukocytes, UA: NEGATIVE
NITRITE: NEGATIVE
PROTEIN: NEGATIVE mg/dL
Specific Gravity, Urine: 1.019 (ref 1.005–1.030)
Squamous Epithelial / LPF: NONE SEEN
Urobilinogen, UA: 1 mg/dL (ref 0.0–1.0)
pH: 6 (ref 5.0–8.0)

## 2014-03-25 ENCOUNTER — Telehealth: Payer: Self-pay | Admitting: Gynecology

## 2014-03-25 NOTE — Telephone Encounter (Signed)
Phone call to Heather Andrews to discuss needed lab work Calcium and Creatinine level for Prolia injection . [place] Left message for Heather Andrews to return phone call. Prolia is due after 04/01/2014.

## 2014-04-01 NOTE — Telephone Encounter (Signed)
Phone call from patient. Patient wants to continue Prolia injections. PCP  Shirlean Mylararol Webb, [facility]. Lab work was done 02/13/2014. Calcium and Creatinine level was normal. Insurance required PA. Forms to be faxed with follow up regarding results from insurance company.

## 2014-04-01 NOTE — Telephone Encounter (Signed)
Phone call to patient regarding Prolia injection and lab work needed. Left message for Heather Andrews to return phone call. Prolia due after 04/01/14.

## 2014-04-08 ENCOUNTER — Telehealth: Payer: Self-pay | Admitting: Gynecology

## 2014-04-08 NOTE — Telephone Encounter (Signed)
Phone call to [employer]. Explained that insurance requires PA. All medical records have been sent. We will call and schedule injection for Prolia next week after we receive the PA. If approved benefits are $81.00 with office visit and $0 without office visit. Medicare deductible has been met.

## 2014-04-09 NOTE — Telephone Encounter (Signed)
Duplicate telephone encounter in error

## 2014-04-15 NOTE — Telephone Encounter (Signed)
Had to resend the PA request due to wrong reference # on cover sheet. Spoke to Antionette H and faxed today. KW CMA

## 2014-04-22 ENCOUNTER — Other Ambulatory Visit (INDEPENDENT_AMBULATORY_CARE_PROVIDER_SITE_OTHER): Payer: Medicare Other | Admitting: Gynecology

## 2014-04-22 DIAGNOSIS — M810 Age-related osteoporosis without current pathological fracture: Secondary | ICD-10-CM

## 2014-04-22 MED ORDER — DENOSUMAB 60 MG/ML ~~LOC~~ SOLN
60.0000 mg | Freq: Once | SUBCUTANEOUS | Status: AC
  Administered 2014-04-22: 60 mg via SUBCUTANEOUS

## 2014-04-22 NOTE — Telephone Encounter (Signed)
Phone call to Liborio NixonJanice she will come in today 04/22/2014 for Prolia injection. Copy of approval in chart.

## 2014-04-22 NOTE — Telephone Encounter (Signed)
Pt's insurance approved the Prolia from 04/18/14-04/18/15. 2 shots. Ref # [id number]. Pt will have $0 out of pocket expense. Pam will call patient to arrange injection. KW CMA

## 2014-10-09 ENCOUNTER — Telehealth: Payer: Self-pay | Admitting: Gynecology

## 2014-10-09 NOTE — Telephone Encounter (Signed)
Phone call to pt left message VM , submitted request for insurance benefits. Prolia due after October 21 2014  . Calcium 9.6 03/28/2014 at PCP.

## 2014-10-29 ENCOUNTER — Telehealth: Payer: Self-pay | Admitting: Gynecology

## 2014-10-29 NOTE — Telephone Encounter (Signed)
Phone call to pt Prolia injection due after 10/21/14. Calcium level 9.6 on 03/28/14 , Benefits are Deductible $166 (met). Co ins 20%, PA needed with secondary insurance ref # [id number] from 04/17/14 thru 04/18/15. Co insurance $92 with OV, $0 with no OV. Patient is coming in 10/30/14 at 2 30pm

## 2014-10-30 ENCOUNTER — Encounter: Payer: Self-pay | Admitting: Gynecology

## 2014-10-30 ENCOUNTER — Ambulatory Visit (INDEPENDENT_AMBULATORY_CARE_PROVIDER_SITE_OTHER): Payer: Medicare Other | Admitting: Gynecology

## 2014-10-30 DIAGNOSIS — M810 Age-related osteoporosis without current pathological fracture: Secondary | ICD-10-CM | POA: Diagnosis not present

## 2014-10-30 MED ORDER — DENOSUMAB 60 MG/ML ~~LOC~~ SOLN
60.0000 mg | Freq: Once | SUBCUTANEOUS | Status: AC
Start: 2014-10-30 — End: 2014-10-30
  Administered 2014-10-30: 60 mg via SUBCUTANEOUS

## 2014-11-11 NOTE — Telephone Encounter (Signed)
Injection prolia given on 10/30/14. Next injection due after 05/03/2015. She will also need repeat calcium level and insurance approval.

## 2015-01-29 ENCOUNTER — Encounter: Payer: Self-pay | Admitting: Gynecology

## 2015-03-03 ENCOUNTER — Encounter: Payer: Self-pay | Admitting: Gynecology

## 2015-03-06 ENCOUNTER — Encounter: Payer: Medicare Other | Admitting: Gynecology

## 2015-03-17 ENCOUNTER — Encounter: Payer: Self-pay | Admitting: Gynecology

## 2015-03-17 ENCOUNTER — Ambulatory Visit (INDEPENDENT_AMBULATORY_CARE_PROVIDER_SITE_OTHER): Payer: Medicare Other | Admitting: Gynecology

## 2015-03-17 ENCOUNTER — Telehealth: Payer: Self-pay | Admitting: Gynecology

## 2015-03-17 VITALS — BP 118/80 | Ht 60.0 in | Wt 150.0 lb

## 2015-03-17 DIAGNOSIS — Z01419 Encounter for gynecological examination (general) (routine) without abnormal findings: Secondary | ICD-10-CM

## 2015-03-17 DIAGNOSIS — N952 Postmenopausal atrophic vaginitis: Secondary | ICD-10-CM

## 2015-03-17 DIAGNOSIS — M8580 Other specified disorders of bone density and structure, unspecified site: Secondary | ICD-10-CM | POA: Diagnosis not present

## 2015-03-17 NOTE — Progress Notes (Signed)
Heather Andrews 08/28/1940 [id number]        74 y.o.  G3P3003  For breast and pelvic exam.  Past medical history,surgical history, problem list, medications, allergies, family history and social history were all reviewed and documented as reviewed in the EPIC chart.  ROS:  Performed with pertinent positives and negatives included in the history, assessment and plan.   Additional significant findings :  none   Exam: Delena ServeKim Alexis assistant Filed Vitals:   03/17/15 1358  BP: 118/80  Height: 5' (1.524 m)  Weight: 150 lb (68.04 kg)   General appearance:  Normal affect, orientation and appearance. Skin: Grossly normal HEENT: Without gross lesions.  No cervical or supraclavicular adenopathy. Thyroid normal.  Lungs:  Clear without wheezing, rales or rhonchi Cardiac: RR, without RMG Abdominal:  Soft, nontender, without masses, guarding, rebound, organomegaly or hernia Breasts:  Examined lying and sitting without masses, retractions, discharge or axillary adenopathy. Pelvic:  Ext/BUS/vagina with atrophic changes  Cervix with atrophic changes  Uterus anteverted, normal size, shape and contour, midline and mobile nontender   Adnexa  Without masses or tenderness    Anus and perineum  Normal   Rectovaginal  Normal sphincter tone without palpated masses or tenderness.    Assessment/Plan:  74 y.o. 333P3003 female for breast and pelvic exam.   1. Postmenopausal.patient without significant hot flushes, night sweats, vaginal dryness or any vaginal bleeding. Continue to monitor and report any issues or bleeding. 2. Osteopenia. DEXA 2014 T score -2.0 FRAX 18%/3.5%. Started on Prolia and is into her second year. Had follow up DEXA 01/2015. T score reported at -1.8 with a 23 the and 22% bone loss. [facility] has changed machines 3 times over the last several years. They're using a conversion factor which I find hard to believe that she has lost this much bone on Prolia stating that  also she has started any exercise program of the last year. I'm going to continue her on Prolia and repeat a bone density in one year as a short interval follow up at [facility] using the same machine and then we'll go from there. I explained all this to the patient and she agrees 3. Pap smear 2011. No Pap smear done today. No history of abnormal Pap smears previously. We both agree to stop screening per current screening guidelines. 4. Mammography 02/2015. Continue with annual mammography when due. SBE monthly reviewed. 5. Colonoscopy never. The patient and I have discussed this on multiple occasions and the patient is reluctant to schedule a colonoscopy. She clearly understands the benefits of precancerous polyp removal and early detection. 6. Health maintenance. No routine lab work done as this is done at her primary physician's office. Follow up in one year, sooner as needed.   Dara LordsFONTAINE,Artesia Berkey P MD, 2:30 PM 03/17/2015

## 2015-03-17 NOTE — Patient Instructions (Addendum)
Schedule your colonoscopy with either:  [facility]   Address: [address], [place], [place] [address]  [phone]    or  [facility]  Address: [address], [place], [place] [address]  [phone]      You may obtain a copy of any labs that were done today by logging onto MyChart as outlined in the instructions provided with your AVS (after visit summary). The office will not call with normal lab results but certainly if there are any significant abnormalities then we will contact you.   Health Maintenance Adopting a healthy lifestyle and getting preventive care can go a long way to promote health and wellness. Talk with your health care provider about what schedule of regular examinations is right for you. This is a good chance for you to check in with your provider about disease prevention and staying healthy. In between checkups, there are plenty of things you can do on your own. Experts have done a lot of research about which lifestyle changes and preventive measures are most likely to keep you healthy. Ask your health care provider for more information. WEIGHT AND DIET  Eat a healthy diet  Be sure to include plenty of vegetables, fruits, low-fat dairy products, and lean protein.  Do not eat a lot of foods high in solid fats, added sugars, or salt.  Get regular exercise. This is one of the most important things you can do for your health.  Most adults should exercise for at least 150 minutes each week. The exercise should increase your heart rate and make you sweat (moderate-intensity exercise).  Most adults should also do strengthening exercises at least twice a week. This is in addition to the moderate-intensity exercise.  Maintain a healthy weight  Body mass index (BMI) is a measurement that can be used to identify possible weight problems. It estimates body fat based on height and weight. Your health care provider can help determine  your BMI and help you achieve or maintain a healthy weight.  For females 22 years of age and older:   A BMI below 18.5 is considered underweight.  A BMI of 18.5 to 24.9 is normal.  A BMI of 25 to 29.9 is considered overweight.  A BMI of 30 and above is considered obese.  Watch levels of cholesterol and blood lipids  You should start having your blood tested for lipids and cholesterol at 73 years of age, then have this test every 5 years.  You may need to have your cholesterol levels checked more often if:  Your lipid or cholesterol levels are high.  You are older than 73 years of age.  You are at high risk for heart disease.  CANCER SCREENING   Lung Cancer  Lung cancer screening is recommended for adults 19-51 years old who are at high risk for lung cancer because of a history of smoking.  A yearly low-dose CT scan of the lungs is recommended for people who:  Currently smoke.  Have quit within the past 15 years.  Have at least a 30-pack-year history of smoking. A pack year is smoking an average of one pack of cigarettes a day for 1 year.  Yearly screening should continue until it has been 15 years since you quit.  Yearly screening should stop if you develop a health problem that would prevent you from having lung cancer treatment.  Breast Cancer  Practice breast self-awareness. This means understanding how your breasts normally appear and  feel.  It also means doing regular breast self-exams. Let your health care provider know about any changes, no matter how small.  If you are in your 20s or 30s, you should have a clinical breast exam (CBE) by a health care provider every 1-3 years as part of a regular health exam.  If you are 35 or older, have a CBE every year. Also consider having a breast X-ray (mammogram) every year.  If you have a family history of breast cancer, talk to your health care provider about genetic screening.  If you are at high risk for breast  cancer, talk to your health care provider about having an MRI and a mammogram every year.  Breast cancer gene (BRCA) assessment is recommended for women who have family members with BRCA-related cancers. BRCA-related cancers include:  Breast.  Ovarian.  Tubal.  Peritoneal cancers.  Results of the assessment will determine the need for genetic counseling and BRCA1 and BRCA2 testing. Cervical Cancer Routine pelvic examinations to screen for cervical cancer are no longer recommended for nonpregnant women who are considered low risk for cancer of the pelvic organs (ovaries, uterus, and vagina) and who do not have symptoms. A pelvic examination may be necessary if you have symptoms including those associated with pelvic infections. Ask your health care provider if a screening pelvic exam is right for you.   The Pap test is the screening test for cervical cancer for women who are considered at risk.  If you had a hysterectomy for a problem that was not cancer or a condition that could lead to cancer, then you no longer need Pap tests.  If you are older than 65 years, and you have had normal Pap tests for the past 10 years, you no longer need to have Pap tests.  If you have had past treatment for cervical cancer or a condition that could lead to cancer, you need Pap tests and screening for cancer for at least 20 years after your treatment.  If you no longer get a Pap test, assess your risk factors if they change (such as having a new sexual partner). This can affect whether you should start being screened again.  Some women have medical problems that increase their chance of getting cervical cancer. If this is the case for you, your health care provider may recommend more frequent screening and Pap tests.  The human papillomavirus (HPV) test is another test that may be used for cervical cancer screening. The HPV test looks for the virus that can cause cell changes in the cervix. The cells  collected during the Pap test can be tested for HPV.  The HPV test can be used to screen women 72 years of age and older. Getting tested for HPV can extend the interval between normal Pap tests from three to five years.  An HPV test also should be used to screen women of any age who have unclear Pap test results.  After 73 years of age, women should have HPV testing as often as Pap tests.  Colorectal Cancer  This type of cancer can be detected and often prevented.  Routine colorectal cancer screening usually begins at 73 years of age and continues through 74 years of age.  Your health care provider may recommend screening at an earlier age if you have risk factors for colon cancer.  Your health care provider may also recommend using home test kits to check for hidden blood in the stool.  A small camera  at the end of a tube can be used to examine your colon directly (sigmoidoscopy or colonoscopy). This is done to check for the earliest forms of colorectal cancer.  Routine screening usually begins at age 40.  Direct examination of the colon should be repeated every 5-10 years through 73 years of age. However, you may need to be screened more often if early forms of precancerous polyps or small growths are found. Skin Cancer  Check your skin from head to toe regularly.  Tell your health care provider about any new moles or changes in moles, especially if there is a change in a mole's shape or color.  Also tell your health care provider if you have a mole that is larger than the size of a pencil eraser.  Always use sunscreen. Apply sunscreen liberally and repeatedly throughout the day.  Protect yourself by wearing long sleeves, pants, a wide-brimmed hat, and sunglasses whenever you are outside. HEART DISEASE, DIABETES, AND HIGH BLOOD PRESSURE   Have your blood pressure checked at least every 1-2 years. High blood pressure causes heart disease and increases the risk of stroke.  If  you are between 74 years and 72 years old, ask your health care provider if you should take aspirin to prevent strokes.  Have regular diabetes screenings. This involves taking a blood sample to check your fasting blood sugar level.  If you are at a normal weight and have a low risk for diabetes, have this test once every three years after 73 years of age.  If you are overweight and have a high risk for diabetes, consider being tested at a younger age or more often. PREVENTING INFECTION  Hepatitis B  If you have a higher risk for hepatitis B, you should be screened for this virus. You are considered at high risk for hepatitis B if:  You were born in a country where hepatitis B is common. Ask your health care provider which countries are considered high risk.  Your parents were born in a high-risk country, and you have not been immunized against hepatitis B (hepatitis B vaccine).  You have HIV or AIDS.  You use needles to inject street drugs.  You live with someone who has hepatitis B.  You have had sex with someone who has hepatitis B.  You get hemodialysis treatment.  You take certain medicines for conditions, including cancer, organ transplantation, and autoimmune conditions. Hepatitis C  Blood testing is recommended for:  Everyone born from [birth year] through 1964.  Anyone with known risk factors for hepatitis C. Sexually transmitted infections (STIs)  You should be screened for sexually transmitted infections (STIs) including gonorrhea and chlamydia if:  You are sexually active and are younger than 73 years of age.  You are older than 73 years of age and your health care provider tells you that you are at risk for this type of infection.  Your sexual activity has changed since you were last screened and you are at an increased risk for chlamydia or gonorrhea. Ask your health care provider if you are at risk.  If you do not have HIV, but are at risk, it may be recommended that  you take a prescription medicine daily to prevent HIV infection. This is called pre-exposure prophylaxis (PrEP). You are considered at risk if:  You are sexually active and do not regularly use condoms or know the HIV status of your partner(s).  You take drugs by injection.  You are sexually active with a partner  who has HIV. Talk with your health care provider about whether you are at high risk of being infected with HIV. If you choose to begin PrEP, you should first be tested for HIV. You should then be tested every 3 months for as long as you are taking PrEP.  PREGNANCY   If you are premenopausal and you may become pregnant, ask your health care provider about preconception counseling.  If you may become pregnant, take 400 to 800 micrograms (mcg) of folic acid every day.  If you want to prevent pregnancy, talk to your health care provider about birth control (contraception). OSTEOPOROSIS AND MENOPAUSE   Osteoporosis is a disease in which the bones lose minerals and strength with aging. This can result in serious bone fractures. Your risk for osteoporosis can be identified using a bone density scan.  If you are 8 years of age or older, or if you are at risk for osteoporosis and fractures, ask your health care provider if you should be screened.  Ask your health care provider whether you should take a calcium or vitamin D supplement to lower your risk for osteoporosis.  Menopause may have certain physical symptoms and risks.  Hormone replacement therapy may reduce some of these symptoms and risks. Talk to your health care provider about whether hormone replacement therapy is right for you.  HOME CARE INSTRUCTIONS   Schedule regular health, dental, and eye exams.  Stay current with your immunizations.   Do not use any tobacco products including cigarettes, chewing tobacco, or electronic cigarettes.  If you are pregnant, do not drink alcohol.  If you are breastfeeding, limit how  much and how often you drink alcohol.  Limit alcohol intake to no more than 1 drink per day for nonpregnant women. One drink equals 12 ounces of beer, 5 ounces of wine, or 1 ounces of hard liquor.  Do not use street drugs.  Do not share needles.  Ask your health care provider for help if you need support or information about quitting drugs.  Tell your health care provider if you often feel depressed.  Tell your health care provider if you have ever been abused or do not feel safe at home. Document Released: 10/18/2010 Document Revised: 08/19/2013 Document Reviewed: 03/06/2013 [facility] Patient Information 2015 [place], [place]. This information is not intended to replace advice given to you by your health care provider. Make sure you discuss any questions you have with your health care provider.

## 2015-03-17 NOTE — Telephone Encounter (Signed)
Continue patient on Prolia this coming year. Note from Dr Audie BoxFontaine. Patient is due after 05/03/15.

## 2015-04-23 ENCOUNTER — Telehealth: Payer: Self-pay | Admitting: Gynecology

## 2015-04-23 NOTE — Telephone Encounter (Signed)
Phone call to pt regarding Prolia injection due after 05/03/15. ICD M85.80  Last Calcium 9.6 03/28/14 will need repeat Calcium check with PCP. Will check insurance benefits, left VM to confirm insurance coverage.

## 2015-04-28 ENCOUNTER — Encounter: Payer: Self-pay | Admitting: Gynecology

## 2015-05-22 ENCOUNTER — Encounter: Payer: Self-pay | Admitting: Gynecology

## 2015-06-08 NOTE — Telephone Encounter (Addendum)
Received insurance information COST FOR PT $0.  Deductible [phone] (met), co insurance 20% $420 . Secondary ins covers deduc and co insur. PA needed approved  REF # [id number] 05/27/2015 THRU 04/17/2016.  CALCIUM 9.4 02/19/2015 at pcp ([facility] documentation).  Appointment for 06/09/15 and will make 6 month appointment also after 12/08/2015.

## 2015-06-09 ENCOUNTER — Ambulatory Visit (INDEPENDENT_AMBULATORY_CARE_PROVIDER_SITE_OTHER): Payer: Medicare Other | Admitting: Gynecology

## 2015-06-09 DIAGNOSIS — M810 Age-related osteoporosis without current pathological fracture: Secondary | ICD-10-CM

## 2015-06-09 MED ORDER — DENOSUMAB 60 MG/ML ~~LOC~~ SOLN
60.0000 mg | Freq: Once | SUBCUTANEOUS | Status: AC
  Administered 2015-06-09: 60 mg via SUBCUTANEOUS

## 2015-06-09 NOTE — Telephone Encounter (Signed)
Prolia injection 06/09/15.  Next injection scheduled at 12/09/15

## 2015-10-27 ENCOUNTER — Telehealth: Payer: Self-pay | Admitting: Gynecology

## 2015-10-27 NOTE — Telephone Encounter (Signed)
Prolia due after  12/09/2015  Calcium level  9.4  02/19/15  pc to pt will check benefits in August

## 2015-11-24 NOTE — Telephone Encounter (Addendum)
Insurance Deductible [phone] (met). Co Insurance 20% $420,Secondary ins PA needed  Ref # [id number] valid 05/27/15 thru 04/17/16 one dose per medical necessity [facility] Secondary cost with OV $94, no OV  $0 cost to pt.

## 2015-11-30 ENCOUNTER — Encounter: Payer: Self-pay | Admitting: Gynecology

## 2015-11-30 NOTE — Telephone Encounter (Signed)
Heather Andrews called left message appointment 12/09/15 Prolia injection

## 2015-12-09 ENCOUNTER — Ambulatory Visit (INDEPENDENT_AMBULATORY_CARE_PROVIDER_SITE_OTHER): Payer: Medicare Other | Admitting: Gynecology

## 2015-12-09 DIAGNOSIS — M810 Age-related osteoporosis without current pathological fracture: Secondary | ICD-10-CM | POA: Diagnosis not present

## 2015-12-09 MED ORDER — DENOSUMAB 60 MG/ML ~~LOC~~ SOLN
60.0000 mg | Freq: Once | SUBCUTANEOUS | Status: AC
  Administered 2015-12-09: 60 mg via SUBCUTANEOUS

## 2015-12-15 NOTE — Telephone Encounter (Signed)
Prolia given 12/09/15  Next due after 06/11/16

## 2016-03-17 ENCOUNTER — Ambulatory Visit (INDEPENDENT_AMBULATORY_CARE_PROVIDER_SITE_OTHER): Payer: Medicare Other | Admitting: Gynecology

## 2016-03-17 ENCOUNTER — Encounter: Payer: Self-pay | Admitting: Gynecology

## 2016-03-17 VITALS — BP 120/76 | Ht 60.0 in | Wt 168.0 lb

## 2016-03-17 DIAGNOSIS — M8580 Other specified disorders of bone density and structure, unspecified site: Secondary | ICD-10-CM

## 2016-03-17 DIAGNOSIS — N952 Postmenopausal atrophic vaginitis: Secondary | ICD-10-CM

## 2016-03-17 DIAGNOSIS — Z01411 Encounter for gynecological examination (general) (routine) with abnormal findings: Secondary | ICD-10-CM

## 2016-03-17 NOTE — Progress Notes (Signed)
    Heather LeashJanice M Andrews 04/26/1940 [id number]        75 y.o.  [id number]  for breast and pelvic exam.  Past medical history,surgical history, problem list, medications, allergies, family history and social history were all reviewed and documented as reviewed in the EPIC chart.  ROS:  Performed with pertinent positives and negatives included in the history, assessment and plan.   Additional significant findings :  None   Exam: Kennon PortelaKim Gardner assistant Vitals:   03/17/16 1406  BP: 120/76  Weight: 168 lb (76.2 kg)  Height: 5' (1.524 m)   Body mass index is 32.81 kg/m.  General appearance:  Normal affect, orientation and appearance. Skin: Grossly normal HEENT: Without gross lesions.  No cervical or supraclavicular adenopathy. Thyroid normal.  Lungs:  Clear without wheezing, rales or rhonchi Cardiac: RR, without RMG Abdominal:  Soft, nontender, without masses, guarding, rebound, organomegaly or hernia Breasts:  Examined lying and sitting without masses, retractions, discharge or axillary adenopathy. Pelvic:  Ext, BUS, Vagina with atrophic changes  Cervix with atrophic changes  Uterus difficult to palpate. No gross masses or tenderness  Adnexa without gross masses or tenderness    Anus and perineum normal   Rectovaginal normal sphincter tone without palpated masses or tenderness.    Assessment/Plan:  75 y.o. [id number] female for breast and pelvic exam.   1. Postmenopausal/atrophic genital changes. No significant hot flushes, night sweats, vaginal dryness or any vaginal bleeding. Continue to monitor report any issues or bleeding. 2. Osteopenia. DEXA 2016 -1.8 with reported 23% and 22% bone loss from prior DEXA. Facility had switched machines several times in the interim. We discussed this previously as she is on Prolia and in her third year. We agreed to continue the Prolia and recheck a DEXA. I reviewed with her we could do it this year for short interval study or wait until next year. Patient  prefers to wait until next year. Will continue on Prolia in the interim. 3. Pap smear 2011. No Pap smear done today. No history of abnormal Pap smears. 4.  5. Current screening guidelines we both agree to stop screening based on age. 6. Mammography due now and patient knows to call and schedule. SBE monthly reviewed. 7. Colonoscopy never. Patient refuses colonoscopy despite multiple discussions and recommendations. 8. Health maintenance. No routine lab work done as patient does this elsewhere. Follow up 1 year, sooner as needed.   Dara LordsFONTAINE,[name] P MD, 2:27 PM 03/17/2016

## 2016-03-17 NOTE — Patient Instructions (Signed)

## 2016-04-06 ENCOUNTER — Encounter: Payer: Self-pay | Admitting: Gynecology

## 2016-05-31 ENCOUNTER — Telehealth: Payer: Self-pay | Admitting: Gynecology

## 2016-05-31 DIAGNOSIS — M8589 Other specified disorders of bone density and structure, multiple sites: Secondary | ICD-10-CM

## 2016-05-31 NOTE — Telephone Encounter (Signed)
Patient has a diag osteopenia. Medicare will not approve Prolia for osteopenia only for Osteoporosis . Please advise about meds for this patient , DEXA report states fracture so is she Osteoporosis with fragility fracture and not Osteopenic?

## 2016-05-31 NOTE — Telephone Encounter (Signed)
Prolia due after 06/11/16  Complete exam 03/17/16 TF. Pt has diagnosis Osteopenia. M85.89

## 2016-06-01 NOTE — Telephone Encounter (Signed)
Verify the patient did have a fracture in the past. If so then her diagnosis is osteoporosis and can be changed.

## 2016-06-01 NOTE — Telephone Encounter (Signed)
MRI and Nuclear Med Bone scan years ago 2004 with dx of stress fracture rt foot age 76

## 2016-06-01 NOTE — Telephone Encounter (Signed)
A stress fracture is not really considered a fragility fracture. I would explain the situation to the patient about Medicare reimbursement and her options are continue Prolia which she could be responsible financially or consider oral bisphosphonate such as Fosamax or possibly Actonel monthly which would be one pill each month or consider Reclast.

## 2016-06-09 NOTE — Telephone Encounter (Signed)
Reclast is available to pt and is covered under Medicare for osteopenia.

## 2016-06-09 NOTE — Telephone Encounter (Signed)
I would go ahead and arrange for Reclast annual infusion.

## 2016-06-16 NOTE — Telephone Encounter (Signed)
Heather NixonJanice would like to check benefits for Reclast we will get back to her with insurance benefits.

## 2016-06-30 NOTE — Telephone Encounter (Signed)
Pt will come in for labs for Reclast 07/01/16  Explained BCBS secondary will cover 20% of what Medicare allowable is.

## 2016-07-01 ENCOUNTER — Other Ambulatory Visit: Payer: Medicare Other

## 2016-07-01 DIAGNOSIS — M8589 Other specified disorders of bone density and structure, multiple sites: Secondary | ICD-10-CM

## 2016-07-01 LAB — CALCIUM: Calcium: 9.2 mg/dL (ref 8.6–10.4)

## 2016-07-01 LAB — CREATININE, SERUM: Creat: 0.9 mg/dL (ref 0.60–0.93)

## 2016-07-05 NOTE — Telephone Encounter (Addendum)
Called pt to check on schedule dates she is available each day except 07/07/16 . Explained parking and instructions for the infusion.Patient has past history of GERD and stomach pain with bisphosphonates

## 2016-07-05 NOTE — Telephone Encounter (Signed)
Results for Heather Andrews, Heather Andrews (MRN [id number]) as of 07/05/2016 14:40  Ref. Range 07/01/2016 08:37  Creatinine Latest Ref Range: 0.60 - 0.93 mg/dL 4.090.90  Calcium Latest Ref Range: 8.6 - 10.4 mg/dL 9.2  Labs are back for Reclast. I will approve with Dr Audie BoxFontaine for Reclast to be scheduled.

## 2016-07-05 NOTE — Telephone Encounter (Signed)
Okay to proceed with Reclast

## 2016-07-11 NOTE — Telephone Encounter (Signed)
Appointment [facility] for pt [facility] 07[facility]18/16 [facility] I called patient left VM to call me and confirm.

## 2016-07-15 ENCOUNTER — Other Ambulatory Visit (HOSPITAL_COMMUNITY): Payer: Self-pay | Admitting: *Deleted

## 2016-07-18 ENCOUNTER — Ambulatory Visit (HOSPITAL_COMMUNITY)
Admission: RE | Admit: 2016-07-18 | Discharge: 2016-07-18 | Disposition: A | Discharge status: Home / Self Care | Payer: Medicare Other | Source: Ambulatory Visit | Attending: Gynecology | Admitting: Gynecology

## 2016-07-18 DIAGNOSIS — M810 Age-related osteoporosis without current pathological fracture: Secondary | ICD-10-CM | POA: Insufficient documentation

## 2016-07-18 MED ORDER — ZOLEDRONIC ACID 5 MG/100ML IV SOLN: 5.0000 mg | Freq: Once | INTRAVENOUS | Status: DC

## 2016-07-18 MED ORDER — ZOLEDRONIC ACID 5 MG/100ML IV SOLN
INTRAVENOUS | Status: AC
  Administered 2016-07-18: 5 mg
  Filled 2016-07-18: qty 100

## 2017-02-24 ENCOUNTER — Encounter: Payer: Self-pay | Admitting: Gynecology

## 2017-02-27 ENCOUNTER — Encounter: Payer: Self-pay | Admitting: Gynecology

## 2017-03-20 ENCOUNTER — Ambulatory Visit (INDEPENDENT_AMBULATORY_CARE_PROVIDER_SITE_OTHER): Payer: Medicare Other | Admitting: Gynecology

## 2017-03-20 ENCOUNTER — Encounter: Payer: Self-pay | Admitting: Gynecology

## 2017-03-20 ENCOUNTER — Encounter: Payer: Medicare Other | Admitting: Gynecology

## 2017-03-20 VITALS — BP 124/80 | Ht 61.0 in | Wt 168.0 lb

## 2017-03-20 DIAGNOSIS — M8580 Other specified disorders of bone density and structure, unspecified site: Secondary | ICD-10-CM

## 2017-03-20 DIAGNOSIS — N952 Postmenopausal atrophic vaginitis: Secondary | ICD-10-CM

## 2017-03-20 DIAGNOSIS — Z01411 Encounter for gynecological examination (general) (routine) with abnormal findings: Secondary | ICD-10-CM | POA: Diagnosis not present

## 2017-03-20 NOTE — Patient Instructions (Signed)
Follow up for your annual Reclast  Follow up for your annual exam in one year

## 2017-03-20 NOTE — Progress Notes (Signed)
    Candi LeashJanice M Lewey 12/03/1940 [id number]        76 y.o.  [id number] for breast and pelvic exam.  Past medical history,surgical history, problem list, medications, allergies, family history and social history were all reviewed and documented as reviewed in the EPIC chart.  ROS:  Performed with pertinent positives and negatives included in the history, assessment and plan.   Additional significant findings : None   Exam: Kennon PortelaKim Gardner assistant Vitals:   03/20/17 1358  BP: 124/80  Weight: 168 lb (76.2 kg)  Height: 5\' 1"  (1.549 m)   Body mass index is 31.74 kg/m.  General appearance:  Normal affect, orientation and appearance. Skin: Grossly normal HEENT: Without gross lesions.  No cervical or supraclavicular adenopathy. Thyroid normal.  Lungs:  Clear without wheezing, rales or rhonchi Cardiac: RR, without RMG Abdominal:  Soft, nontender, without masses, guarding, rebound, organomegaly or hernia Breasts:  Examined lying and sitting without masses, retractions, discharge or axillary adenopathy. Pelvic:  Ext, BUS, Vagina: With atrophic changes  Cervix: With atrophic changes  Uterus: Difficult to palpate but no gross masses or tenderness  Adnexa: Without gross masses or tenderness    Anus and perineum: Normal   Rectovaginal: Normal sphincter tone without palpated masses or tenderness.    Assessment/Plan:  76 y.o. [id number] female for breast and pelvic exam.   1. Postmenopausal/atrophic genital changes.  No significant hot flushes, night sweats, vaginal dryness or any vaginal bleeding.  Continue to monitor and report any issues or bleeding. 2. Osteopenia.  Patient was started on Prolia due to increased FRAX and subsequently switched over to Reclast due to cost last year.  Has received a 4-year course.  Recent bone density this past month with T score -1.6 for all stable from prior DEXA.  Will continue on Reclast for now and plan repeat DEXA in 2 years.  Discussed possible drug-free holiday  at that time. 3. Pap smear 2011.  No Pap smear done today.  No history of abnormal Pap smears.  Per current screening guidelines we both agree to stop screening based on age. 4. Mammography due month that she has scheduled.  Breast exam normal today. 5. Colonoscopy never.  Patient refuses colonoscopy despite recommendations.  She understands the risks of colon cancer. 6. Health maintenance.  No routine lab work done as patient does this elsewhere.  Follow-up 1 year, sooner as needed.   Dara Lordsimothy P Veona Bittman MD, 2:32 PM 03/20/2017

## 2017-04-07 ENCOUNTER — Encounter: Payer: Self-pay | Admitting: Gynecology

## 2017-07-03 DIAGNOSIS — M1712 Unilateral primary osteoarthritis, left knee: Secondary | ICD-10-CM | POA: Insufficient documentation

## 2017-07-10 ENCOUNTER — Telehealth: Payer: Self-pay | Admitting: Gynecology

## 2017-07-10 DIAGNOSIS — M8589 Other specified disorders of bone density and structure, multiple sites: Secondary | ICD-10-CM

## 2017-07-10 NOTE — Telephone Encounter (Signed)
Reclast infusion due 07/19/17 Complete with TF  03/20/17 note states continue Reclast. Pt will need insurance verify Pr will need labs calcium and creatinine.

## 2017-07-12 ENCOUNTER — Telehealth: Payer: Self-pay | Admitting: Gynecology

## 2017-07-12 NOTE — Telephone Encounter (Signed)
Duplicate

## 2017-07-13 NOTE — Telephone Encounter (Signed)
PC to pt no answer and no VM to leave message, need new insurance information for Reclast.

## 2017-07-19 NOTE — Telephone Encounter (Addendum)
PC to pt no change with insurance , I explained that we will call and verify no PA needed and her benefits prior to her lab work for Reclast.   Osteopenia dx

## 2017-07-24 NOTE — Telephone Encounter (Signed)
LM to call regarding lab work TBS. KAB pc to to [employer], NO PA required per ref # [id number].  Medicare Primary with BCBS PPO secondary.  Medicare approval 80 %  [id number] $651.00  [id number]  $69.03   Total $720.03  . 20 % insurance $144.00

## 2017-07-24 NOTE — Telephone Encounter (Signed)
[facility] Davidian KeyShirline Frees: [id number] - PA Case : [id number]: [id number] Need help? Call us at [phone]  Outcome  Approvedtoday  Your PA request has been approved. Additional information will be provided in the approval communication. (Message [id number])

## 2017-07-27 NOTE — Telephone Encounter (Signed)
Labs scheduled  07/28/17 @9 :00

## 2017-07-27 NOTE — Telephone Encounter (Signed)
Lab work TBS, orders placed.

## 2017-07-28 ENCOUNTER — Other Ambulatory Visit: Payer: Medicare Other

## 2017-07-28 DIAGNOSIS — M8589 Other specified disorders of bone density and structure, multiple sites: Secondary | ICD-10-CM

## 2017-07-28 LAB — CALCIUM: Calcium: 9.3 mg/dL (ref 8.6–10.4)

## 2017-07-28 LAB — CREATININE, SERUM: Creat: 0.77 mg/dL (ref 0.60–0.93)

## 2017-08-01 NOTE — Telephone Encounter (Addendum)
Labs received 07/28/17   Calcium 9.3 Creatinine 0.77  Reclast due 07/19/17 NO PA per [employer] Medicare ref # [id number]  Appointment 08/10/17  At 10 am  Instructions mailed to patient.

## 2017-08-02 NOTE — Telephone Encounter (Signed)
Pt to pt left VM regarding Reclast appoinment.  Instructions mailed

## 2017-08-03 ENCOUNTER — Encounter: Payer: Self-pay | Admitting: Gynecology

## 2017-08-08 NOTE — Telephone Encounter (Signed)
Heather Andrews received my message and is ok with 08/10/17

## 2017-08-09 ENCOUNTER — Other Ambulatory Visit (HOSPITAL_COMMUNITY): Payer: Self-pay | Admitting: *Deleted

## 2017-08-10 ENCOUNTER — Ambulatory Visit (HOSPITAL_COMMUNITY)
Admission: RE | Admit: 2017-08-10 | Discharge: 2017-08-10 | Disposition: A | Discharge status: Home / Self Care | Payer: Medicare Other | Source: Ambulatory Visit | Attending: Gynecology | Admitting: Gynecology

## 2017-08-10 DIAGNOSIS — M8589 Other specified disorders of bone density and structure, multiple sites: Secondary | ICD-10-CM | POA: Insufficient documentation

## 2017-08-10 MED ORDER — ZOLEDRONIC ACID 5 MG/100ML IV SOLN
INTRAVENOUS | Status: AC
  Administered 2017-08-10: 5 mg via INTRAVENOUS
  Filled 2017-08-10: qty 100

## 2017-08-10 MED ORDER — ZOLEDRONIC ACID 5 MG/100ML IV SOLN
5.0000 mg | Freq: Once | INTRAVENOUS | Status: AC
  Administered 2017-08-10: 5 mg via INTRAVENOUS

## 2017-08-14 NOTE — Telephone Encounter (Signed)
Heather Andrews received her Reclast at [facility] 08/10/17 next Reclast if ordered by provider will be due after 08/12/18.

## 2017-08-25 ENCOUNTER — Encounter: Payer: Self-pay | Admitting: Gynecology

## 2018-03-22 ENCOUNTER — Ambulatory Visit (INDEPENDENT_AMBULATORY_CARE_PROVIDER_SITE_OTHER): Payer: Medicare Other | Admitting: Gynecology

## 2018-03-22 ENCOUNTER — Encounter: Payer: Self-pay | Admitting: Gynecology

## 2018-03-22 VITALS — BP 122/74 | Ht 60.0 in | Wt 160.0 lb

## 2018-03-22 DIAGNOSIS — Z01419 Encounter for gynecological examination (general) (routine) without abnormal findings: Secondary | ICD-10-CM | POA: Diagnosis not present

## 2018-03-22 DIAGNOSIS — M8580 Other specified disorders of bone density and structure, unspecified site: Secondary | ICD-10-CM

## 2018-03-22 DIAGNOSIS — N952 Postmenopausal atrophic vaginitis: Secondary | ICD-10-CM

## 2018-03-22 NOTE — Patient Instructions (Signed)
Follow-up for Reclast this coming year when due.  Follow-up in 1 year for annual exam.  We will plan on repeating her bone density next year.

## 2018-03-22 NOTE — Progress Notes (Signed)
    Heather LeashJanice M Andrews 09/01/1940 [id number]        77 y.o.  G3P3003 for breast and pelvic exam.  Past medical history,surgical history, problem list, medications, allergies, family history and social history were all reviewed and documented as reviewed in the EPIC chart.  ROS:  Performed with pertinent positives and negatives included in the history, assessment and plan.   Additional significant findings : None   Exam: Kennon PortelaKim Gardner assistant Vitals:   03/22/18 0924  BP: 122/74  Weight: 160 lb (72.6 kg)  Height: 5' (1.524 m)   Body mass index is 31.25 kg/m.  General appearance:  Normal affect, orientation and appearance. Skin: Grossly normal HEENT: Without gross lesions.  No cervical or supraclavicular adenopathy. Thyroid normal.  Lungs:  Clear without wheezing, rales or rhonchi Cardiac: RR, without RMG Abdominal:  Soft, nontender, without masses, guarding, rebound, organomegaly or hernia Breasts:  Examined lying and sitting without masses, retractions, discharge or axillary adenopathy. Pelvic:  Ext, BUS, Vagina: With atrophic changes  Cervix: With atrophic changes  Uterus: Difficult to palpate but no gross masses or tenderness  Adnexa: Without masses or tenderness    Anus and perineum: Normal   Rectovaginal: Normal sphincter tone without palpated masses or tenderness.    Assessment/Plan:  77 y.o. [id number] female for breast and pelvic exam.   1. Postmenopausal/atrophic genital changes.  No significant menopausal symptoms or any vaginal bleeding. 2. Osteopenia.  DEXA 02/2017 T score -1.6.  Started on Prolia due to increased FRAX and subsequently switched to Reclast.  Has received 5-year course.  Is doing well with this.  We will continue Reclast this year and repeat her DEXA next year at 2-year interval and consider drug-free interval. 3. Mammography due now and I reminded her to schedule.  Breast exam normal today. 4. Colonoscopy never.  We again discussed colonoscopy  recommendations and she refuses. 5. Pap smear 2011.  No Pap smear done today.  No history of abnormal Pap smears.  We both agree to stop screening per current screening guidelines based on age. 6. Health maintenance.  No routine lab work done as patient does this elsewhere.  Follow-up in 1 year, sooner as needed.     Dara Lordsimothy P Rmoni Keplinger MD, 10:15 AM 03/22/2018

## 2018-06-24 ENCOUNTER — Observation Stay (HOSPITAL_COMMUNITY)
Admission: EM | Admit: 2018-06-24 | Discharge: 2018-06-25 | Disposition: A | Discharge status: Home / Self Care | Payer: Medicare Other | Attending: Internal Medicine | Admitting: Internal Medicine

## 2018-06-24 ENCOUNTER — Other Ambulatory Visit: Payer: Self-pay

## 2018-06-24 ENCOUNTER — Encounter (HOSPITAL_COMMUNITY): Payer: Self-pay | Admitting: Emergency Medicine

## 2018-06-24 ENCOUNTER — Emergency Department (HOSPITAL_COMMUNITY): Payer: Medicare Other

## 2018-06-24 DIAGNOSIS — Z87891 Personal history of nicotine dependence: Secondary | ICD-10-CM | POA: Diagnosis not present

## 2018-06-24 DIAGNOSIS — K219 Gastro-esophageal reflux disease without esophagitis: Secondary | ICD-10-CM | POA: Insufficient documentation

## 2018-06-24 DIAGNOSIS — M25512 Pain in left shoulder: Secondary | ICD-10-CM | POA: Insufficient documentation

## 2018-06-24 DIAGNOSIS — R072 Precordial pain: Secondary | ICD-10-CM | POA: Diagnosis not present

## 2018-06-24 DIAGNOSIS — I10 Essential (primary) hypertension: Secondary | ICD-10-CM | POA: Diagnosis not present

## 2018-06-24 DIAGNOSIS — E785 Hyperlipidemia, unspecified: Secondary | ICD-10-CM | POA: Insufficient documentation

## 2018-06-24 DIAGNOSIS — Z8249 Family history of ischemic heart disease and other diseases of the circulatory system: Secondary | ICD-10-CM | POA: Diagnosis not present

## 2018-06-24 DIAGNOSIS — R079 Chest pain, unspecified: Secondary | ICD-10-CM | POA: Diagnosis present

## 2018-06-24 DIAGNOSIS — Z79899 Other long term (current) drug therapy: Secondary | ICD-10-CM | POA: Diagnosis not present

## 2018-06-24 DIAGNOSIS — Z888 Allergy status to other drugs, medicaments and biological substances status: Secondary | ICD-10-CM | POA: Diagnosis not present

## 2018-06-24 DIAGNOSIS — R0789 Other chest pain: Principal | ICD-10-CM | POA: Insufficient documentation

## 2018-06-24 DIAGNOSIS — E7800 Pure hypercholesterolemia, unspecified: Secondary | ICD-10-CM | POA: Diagnosis present

## 2018-06-24 DIAGNOSIS — M8580 Other specified disorders of bone density and structure, unspecified site: Secondary | ICD-10-CM | POA: Diagnosis not present

## 2018-06-24 LAB — BASIC METABOLIC PANEL
Anion gap: 13 (ref 5–15)
BUN: 15 mg/dL (ref 8–23)
CO2: 26 mmol/L (ref 22–32)
Calcium: 9.6 mg/dL (ref 8.9–10.3)
Chloride: 100 mmol/L (ref 98–111)
Creatinine, Ser: 0.79 mg/dL (ref 0.44–1.00)
GFR calc Af Amer: >60 mL/min (ref >60)
GFR calc non Af Amer: >60 mL/min (ref >60)
Glucose, Bld: 119 mg/dL — ABNORMAL HIGH (ref 70–99)
Potassium: 3.7 mmol/L (ref 3.5–5.1)
Sodium: 139 mmol/L (ref 135–145)

## 2018-06-24 LAB — CBC
HCT: 41.5 % (ref 36.0–46.0)
Hemoglobin: 13 g/dL (ref 12.0–15.0)
MCH: 28.1 pg (ref 26.0–34.0)
MCHC: 31.3 g/dL (ref 30.0–36.0)
MCV: 89.8 fL (ref 80.0–100.0)
Platelets: 280 10*3/uL (ref 150–400)
RBC: 4.62 MIL/uL (ref 3.87–5.11)
RDW: 14.6 % (ref 11.5–15.5)
WBC: 9.8 10*3/uL (ref 4.0–10.5)
nRBC: 0 % (ref 0.0–0.2)

## 2018-06-24 LAB — TROPONIN I
Troponin I: <0.03 ng/mL (ref <0.03)
Troponin I: <0.03 ng/mL (ref <0.03)

## 2018-06-24 LAB — I-STAT TROPONIN, ED: Troponin i, poc: 0.04 ng/mL (ref 0.00–0.08)

## 2018-06-24 MED ORDER — ALUM & MAG HYDROXIDE-SIMETH 200-200-20 MG/5ML PO SUSP
30.0000 mL | ORAL | Status: DC | PRN
  Administered 2018-06-24: 30 mL via ORAL
  Filled 2018-06-24: qty 30

## 2018-06-24 MED ORDER — ENOXAPARIN SODIUM 40 MG/0.4ML ~~LOC~~ SOLN
40.0000 mg | SUBCUTANEOUS | Status: DC
  Administered 2018-06-24: 40 mg via SUBCUTANEOUS
  Filled 2018-06-24: qty 0.4

## 2018-06-24 MED ORDER — SODIUM CHLORIDE 0.9% FLUSH
3.0000 mL | Freq: Once | INTRAVENOUS | Status: AC
  Administered 2018-06-24: 3 mL via INTRAVENOUS

## 2018-06-24 MED ORDER — MIRABEGRON ER 50 MG PO TB24
50.0000 mg | ORAL_TABLET | Freq: Every day | ORAL | Status: DC
  Administered 2018-06-24 – 2018-06-25 (×2): 50 mg via ORAL
  Filled 2018-06-24 (×2): qty 1

## 2018-06-24 MED ORDER — ALBUTEROL SULFATE (2.5 MG/3ML) 0.083% IN NEBU: 2.5000 mg | INHALATION_SOLUTION | Freq: Four times a day (QID) | RESPIRATORY_TRACT | Status: DC | PRN

## 2018-06-24 MED ORDER — PANTOPRAZOLE SODIUM 40 MG PO TBEC
40.0000 mg | DELAYED_RELEASE_TABLET | Freq: Every day | ORAL | Status: DC
  Administered 2018-06-24 – 2018-06-25 (×2): 40 mg via ORAL
  Filled 2018-06-24 (×2): qty 1

## 2018-06-24 MED ORDER — HYDROCHLOROTHIAZIDE 12.5 MG PO CAPS
12.5000 mg | ORAL_CAPSULE | Freq: Every day | ORAL | Status: DC
  Administered 2018-06-24 – 2018-06-25 (×2): 12.5 mg via ORAL
  Filled 2018-06-24 (×2): qty 1

## 2018-06-24 MED ORDER — ONDANSETRON HCL 4 MG/2ML IJ SOLN: 4.0000 mg | Freq: Four times a day (QID) | INTRAMUSCULAR | Status: DC | PRN

## 2018-06-24 MED ORDER — ASPIRIN EC 325 MG PO TBEC
325.0000 mg | DELAYED_RELEASE_TABLET | Freq: Every day | ORAL | Status: DC
  Administered 2018-06-25: 325 mg via ORAL
  Filled 2018-06-24 (×2): qty 1

## 2018-06-24 MED ORDER — ACETAMINOPHEN 325 MG PO TABS: 650.0000 mg | ORAL_TABLET | ORAL | Status: DC | PRN

## 2018-06-24 MED ORDER — AMLODIPINE BESYLATE 2.5 MG PO TABS
5.0000 mg | ORAL_TABLET | Freq: Every day | ORAL | Status: DC
  Administered 2018-06-24 – 2018-06-25 (×2): 5 mg via ORAL
  Filled 2018-06-24: qty 2
  Filled 2018-06-24: qty 1

## 2018-06-24 MED ORDER — ASPIRIN 81 MG PO CHEW
324.0000 mg | CHEWABLE_TABLET | Freq: Once | ORAL | Status: AC
  Administered 2018-06-24: 324 mg via ORAL
  Filled 2018-06-24: qty 4

## 2018-06-24 MED ORDER — FAMOTIDINE IN NACL 20-0.9 MG/50ML-% IV SOLN
20.0000 mg | Freq: Once | INTRAVENOUS | Status: AC
  Administered 2018-06-24: 20 mg via INTRAVENOUS
  Filled 2018-06-24: qty 50

## 2018-06-24 MED ORDER — ALBUTEROL SULFATE HFA 108 (90 BASE) MCG/ACT IN AERS
2.0000 | INHALATION_SPRAY | Freq: Four times a day (QID) | RESPIRATORY_TRACT | Status: DC | PRN
  Filled 2018-06-24: qty 6.7

## 2018-06-24 MED ORDER — TRAZODONE HCL 50 MG PO TABS
50.0000 mg | ORAL_TABLET | Freq: Once | ORAL | Status: AC
  Administered 2018-06-24: 50 mg via ORAL
  Filled 2018-06-24: qty 1

## 2018-06-24 NOTE — ED Triage Notes (Addendum)
Patient reports "heartburn" since Friday, she states that she woke up this am and a few minutes after she began having tingling in her L arm that has now progressed to an achy pain. She also endorses pain now on the L side of her chest and abdomen that radiates some to her back. resp e/u, nad. Neurologically intact w/o deficits.

## 2018-06-24 NOTE — ED Notes (Signed)
ED TO INPATIENT HANDOFF REPORT  ED Nurse Name and Phone #: [phone] Curtina Grills F  S Name/Age/Gender Heather Andrews 78 y.o. female Room/Bed: H013C/H013C  Code Status   Code Status: Full Code  Home/SNF/Other Home Patient oriented to: self, place, time and situation Is this baseline? Yes   Triage Complete: Triage complete  Chief Complaint Arm tingling  Triage Note Patient reports "heartburn" since Friday, she states that she woke up this am and a few minutes after she began having tingling in her L arm that has now progressed to an achy pain. She also endorses pain now on the L side of her chest and abdomen that radiates some to her back. resp e/u, nad. Neurologically intact w/o deficits.    Allergies Allergies  Allergen Reactions  . Ephedrine-Guaifenesin     Level of Care/Admitting Diagnosis ED Disposition    ED Disposition Condition Comment   Admit  Hospital Area: [facility] [[id number]]  Level of Care: Cardiac Telemetry [103]  I expect the patient will be discharged within 24 hours: Yes  LOW acuity---Tx typically complete <24 hrs---ACUTE conditions typically can be evaluated <24 hours---LABS likely to return to acceptable levels <24 hours---IS near functional baseline---EXPECTED to return to current living arrangement---NOT newly hypoxic: Meets criteria for 5C-Observation unit  Diagnosis: Chest pain [462703]  Admitting Physician: Dorcas Carrow [[id number]]  Attending Physician: Dorcas Carrow [[id number]]  PT Class (Do Not Modify): Observation [104]  PT Acc Code (Do Not Modify): Observation [10022]       B Medical/Surgery History Past Medical History:  Diagnosis Date  . Cataracts, bilateral   . Elevated cholesterol   . History of shingles   . Hypertension   . Osteopenia 02/2017   T score -1.6 FRAX 17% / 3.3% stable from prior DEXA  . Stress incontinence, female    USI   Past Surgical History:  Procedure Laterality Date  . BASAL CELL CARCINOMA  EXCISION    . CATARACT EXTRACTION    . Cyst removed from chest    . 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"     A IV Location/Drains/Wounds Patient Lines/Drains/Airways Status   Active Line/Drains/Airways    Name:   Placement date:   Placement time:   Site:   Days:   Peripheral IV 06/24/18 Right Antecubital   06/24/18    -    Antecubital   less than 1          Intake/Output Last 24 hours No intake or output data in the 24 hours ending 06/24/18 1717  Labs/Imaging Results for orders placed or performed during the hospital encounter of 06/24/18 (from the past 48 hour(s))  Basic metabolic panel     Status: Abnormal   Collection Time: 06/24/18  9:04 AM  Result Value Ref Range   Sodium 139 135 - 145 mmol/L   Potassium 3.7 3.5 - 5.1 mmol/L   Chloride 100 98 - 111 mmol/L   CO2 26 22 - 32 mmol/L   Glucose, Bld 119 (H) 70 - 99 mg/dL   BUN 15 8 - 23 mg/dL   Creatinine, Ser 6.96 0.44 - 1.00 mg/dL   Calcium 9.6 8.9 - 78.9 mg/dL   GFR calc non Af Amer >60 >60 mL/min   GFR calc Af Amer >60 >60 mL/min   Anion gap 13 5 - 15    Comment: Performed at [facility], [address], [place], [place] [address]  CBC     Status:  None   Collection Time: 06/24/18  9:04 AM  Result Value Ref Range   WBC 9.8 4.0 - 10.5 K/uL   RBC 4.62 3.87 - 5.11 MIL/uL   Hemoglobin 13.0 12.0 - 15.0 g/dL   HCT 68.3 41.9 - 62.2 %   MCV 89.8 80.0 - 100.0 fL   MCH 28.1 26.0 - 34.0 pg   MCHC 31.3 30.0 - 36.0 g/dL   RDW 29.7 98.9 - 21.1 %   Platelets 280 150 - 400 K/uL   nRBC 0.0 0.0 - 0.2 %    Comment: Performed at [facility], [address] [place], [place] [address]  I-stat troponin, ED     Status: None   Collection Time: 06/24/18  9:15 AM  Result Value Ref Range   Troponin i, poc 0.04 0.00 - 0.08 ng/mL   Comment 3            Comment: Due to the release kinetics of cTnI, a negative result within the first hours of the onset of symptoms does not rule out myocardial infarction with  certainty. If myocardial infarction is still suspected, repeat the test at appropriate intervals.   Troponin I - Now Then Q6H     Status: None   Collection Time: 06/24/18 11:45 AM  Result Value Ref Range   Troponin I <0.03 <0.03 ng/mL    Comment: Performed at [facility], [address] [place], [place] [address]   Dg Chest 2 View  Result Date: 06/24/2018 CLINICAL DATA:  Left arm pain and tingling EXAM: CHEST - 2 VIEW COMPARISON:  07/19/2013 FINDINGS: Normal heart size and mediastinal contours. Right cardiophrenic sulcus fat pad. Artifact from EKG leads. There is no edema, consolidation, effusion, or pneumothorax. IMPRESSION: No evidence of active disease. Electronically Signed   By: Marnee Spring M.D.   On: 06/24/2018 09:55    Pending Labs Unresulted Labs (From admission, onward)    Start     Ordered   06/24/18 1126  Troponin I - Now Then Q6H  Now then every 6 hours,   R     06/24/18 1125          Vitals/Pain Today's Vitals   06/24/18 1100 06/24/18 1115 06/24/18 1130 06/24/18 1637  BP: 130/70   (!) 148/52  Pulse: 68 70 64 68  Resp: (!) 25 17 (!) 23 16  Temp:      TempSrc:      SpO2: 97% 97% 98% 97%  PainSc:        Isolation Precautions No active isolations  Medications Medications  acetaminophen (TYLENOL) tablet 650 mg (has no administration in time range)  ondansetron (ZOFRAN) injection 4 mg (has no administration in time range)  enoxaparin (LOVENOX) injection 40 mg (has no administration in time range)  aspirin EC tablet 325 mg (has no administration in time range)  pantoprazole (PROTONIX) EC tablet 40 mg (40 mg Oral Given 06/24/18 1702)  amLODipine (NORVASC) tablet 5 mg (5 mg Oral Given 06/24/18 1702)  hydrochlorothiazide (MICROZIDE) capsule 12.5 mg (12.5 mg Oral Given 06/24/18 1703)  mirabegron ER (MYRBETRIQ) tablet 50 mg (50 mg Oral Given 06/24/18 1703)  albuterol (PROVENTIL HFA;VENTOLIN HFA) 108 (90 Base) MCG/ACT inhaler 2 puff (has no administration in time  range)  sodium chloride flush (NS) 0.9 % injection 3 mL (3 mLs Intravenous Given 06/24/18 1139)  aspirin chewable tablet 324 mg (324 mg Oral Given 06/24/18 1139)  famotidine (PEPCID) IVPB 20 mg premix (0 mg Intravenous Stopped 06/24/18  1548)    Mobility walks Low fall risk   Focused Assessments Cardiac Assessment Handoff:    Lab Results  Component Value Date   TROPONINI <0.03 06/24/2018   No results found for: DDIMER Does the Patient currently have chest pain? No     R Recommendations: See Admitting Provider Note  Report given to:   Additional Notes:  Pt is currently chest pain free. Received Pepcid in the ED. Negative troponins. A&O x4 and walks on their own.

## 2018-06-24 NOTE — H&P (Signed)
History and Physical    Heather Andrews Bessey KGS:[id number] DOB: 08/30/1940 DOA: 06/24/2018  PCP: Shirlean Mylar, MD  Patient coming from: Home  I have personally briefly reviewed patient's old medical records available.   Chief Complaint: Left-sided chest pain, left shoulder pain.  HPI: AMRI GRIMSHAW is a 78 y.o. female with medical history significant of Hypertension, hyperlipidemia not on treatment otherwise good health presents to the emergency room with chest pain which she describes as heartburn since Friday.  According to the patient, she does not have similar chest pain in the past.  Denies any history of angina or shortness of breath.  Insidious in onset, started 48 hours ago, pressure-like sensation, intermittent, initially it was on her left anterior chest, mild intensity, later on went to her left shoulder blade.  Today she also felt some numbness on her left arm that has improved now.  On arrival to ER, she only has left shoulder blade pain today. No fever or chills.  No cough cold or flulike symptoms.  No recent travel.  No sick contacts.  Her father had MI when he was 18, however patient is 76 now with no history of MI. ED Course: Hemodynamically stable.  Afebrile.  Twelve-lead EKG nonischemic.  Troponins are nonischemic.  Electrolytes are normal.  Chest x-ray is normal. Given a dose of aspirin in the ER.  She was also given 20 mg of IV Pepcid.  Review of Systems: As per HPI otherwise 10 point review of systems negative.    Past Medical History:  Diagnosis Date  . Cataracts, bilateral   . Elevated cholesterol   . History of shingles   . Hypertension   . Osteopenia 02/2017   T score -1.6 FRAX 17% / 3.3% stable from prior DEXA  . Stress incontinence, female    USI    Past Surgical History:  Procedure Laterality Date  . BASAL CELL CARCINOMA EXCISION    . CATARACT EXTRACTION    . Cyst removed from chest    . 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 She has never used smokeless tobacco. She reports current alcohol use. She reports that she does not use drugs.  No Known Allergies  Family History  Problem Relation Age of Onset  . Hypertension Mother   . Heart disease Paternal Grandmother   . Heart disease Paternal Grandfather   . Cancer Brother        Leukemia     Prior to Admission medications   Medication Sig Start Date End Date Taking? Authorizing Provider  amLODipine (NORVASC) 5 MG tablet Take 5 mg by mouth daily.    [provider]  Calcium Carb-Cholecalciferol (CALCIUM 1000 + D PO) Take by mouth.    [provider]  Cholecalciferol (VITAMIN D PO) Take by mouth.    [provider]  hydrochlorothiazide (MICROZIDE) 12.5 MG capsule Take 12.5 mg by mouth daily. 02/21/15   [provider]  mirabegron ER (MYRBETRIQ) 50 MG TB24 tablet Take 50 mg by mouth every evening.    [provider]  Multiple Vitamin (MULTIVITAMIN) tablet Take 1 tablet by mouth daily.    [provider]    Physical Exam: Vitals:   06/24/18 1042 06/24/18 1045 06/24/18 1100 06/24/18 1115  BP: 110/75  130/70   Pulse: 64 69 68 70  Resp: 16 (!) 24 (!) 25 17  Temp:      TempSrc:  SpO2: 98% 97% 97% 97%    Constitutional: NAD, calm, comfortable Vitals:   06/24/18 1042 06/24/18 1045 06/24/18 1100 06/24/18 1115  BP: 110/75  130/70   Pulse: 64 69 68 70  Resp: 16 (!) 24 (!) 25 17  Temp:      TempSrc:      SpO2: 98% 97% 97% 97%   Eyes: PERRL, lids and conjunctivae normal ENMT: Mucous membranes are moist. Posterior pharynx clear of any exudate or lesions.Normal dentition.  Neck: normal, supple, no masses, no thyromegaly Respiratory: clear to auscultation bilaterally, no wheezing, no crackles. Normal respiratory effort. No accessory muscle use.  Cardiovascular: Regular rate and rhythm, no murmurs / rubs / gallops. No extremity edema. 2+ pedal pulses. No carotid bruits.  Patient has some  reproducible pain on her shoulder blade on left side on movement of the left arm. Abdomen: no tenderness, no masses palpated. No hepatosplenomegaly. Bowel sounds positive.  Musculoskeletal: no clubbing / cyanosis. No joint deformity upper and lower extremities. Good ROM, no contractures. Normal muscle tone.  Skin: no rashes, lesions, ulcers. No induration Neurologic: CN 2-12 grossly intact. Sensation intact, DTR normal. Strength 5/5 in all 4.  Psychiatric: Normal judgment and insight. Alert and oriented x 3. Normal mood.     Labs on Admission: I have personally reviewed following labs and imaging studies  CBC: Recent Labs  Lab 06/24/18 0904  WBC 9.8  HGB 13.0  HCT 41.5  MCV 89.8  PLT 280   Basic Metabolic Panel: Recent Labs  Lab 06/24/18 0904  NA 139  K 3.7  CL 100  CO2 26  GLUCOSE 119*  BUN 15  CREATININE 0.79  CALCIUM 9.6   GFR: CrCl cannot be calculated (Unknown ideal weight.). Liver Function Tests: No results for input(s): AST, ALT, ALKPHOS, BILITOT, PROT, ALBUMIN in the last 168 hours. No results for input(s): LIPASE, AMYLASE in the last 168 hours. No results for input(s): AMMONIA in the last 168 hours. Coagulation Profile: No results for input(s): INR, PROTIME in the last 168 hours. Cardiac Enzymes: No results for input(s): CKTOTAL, CKMB, CKMBINDEX, TROPONINI in the last 168 hours. BNP (last 3 results) No results for input(s): PROBNP in the last 8760 hours. HbA1C: No results for input(s): HGBA1C in the last 72 hours. CBG: No results for input(s): GLUCAP in the last 168 hours. Lipid Profile: No results for input(s): CHOL, HDL, LDLCALC, TRIG, CHOLHDL, LDLDIRECT in the last 72 hours. Thyroid Function Tests: No results for input(s): TSH, T4TOTAL, FREET4, T3FREE, THYROIDAB in the last 72 hours. Anemia Panel: No results for input(s): VITAMINB12, FOLATE, FERRITIN, TIBC, IRON, RETICCTPCT in the last 72 hours. Urine analysis:    Component Value Date/Time    COLORURINE YELLOW 03/03/2014 1556   APPEARANCEUR CLEAR 03/03/2014 1556   LABSPEC 1.019 03/03/2014 1556   PHURINE 6.0 03/03/2014 1556   GLUCOSEU NEG 03/03/2014 1556   HGBUR NEG 03/03/2014 1556   BILIRUBINUR NEG 03/03/2014 1556   KETONESUR NEG 03/03/2014 1556   PROTEINUR NEG 03/03/2014 1556   UROBILINOGEN 1 03/03/2014 1556   NITRITE NEG 03/03/2014 1556   LEUKOCYTESUR NEG 03/03/2014 1556    Radiological Exams on Admission: Dg Chest 2 View  Result Date: 06/24/2018 CLINICAL DATA:  Left arm pain and tingling EXAM: CHEST - 2 VIEW COMPARISON:  07/19/2013 FINDINGS: Normal heart size and mediastinal contours. Right cardiophrenic sulcus fat pad. Artifact from EKG leads. There is no edema, consolidation, effusion, or pneumothorax. IMPRESSION: No evidence of active disease. Electronically Signed   By: Marja Kays  Watts M.D.   On: 06/24/2018 09:55    EKG: Independently reviewed.  Normal sinus rhythm.  No acute ST-T wave changes.  No previous EKG to compare.  Assessment/Plan Principal Problem:   Chest pain Active Problems:   Hypertension   Elevated cholesterol     1.  Chest pain and left shoulder pain: Probably atypical pain.  More than 48 hours of pain with negative EKG and biomarkers. We will admit patient to the telemetry unit given severity of symptoms. Currently chest pain improved Cycle EKG and troponins. Supplemental oxygen to keep saturations more than 90%. Aspirin 324 given in the ER, will continue on daily aspirin. Nitroglycerin and Morphine for severe and recurrent pain. Rule out acute coronary syndrome.  Anticipating outpatient stress test if rules out.  2.  Hypertension: Fairly stable.  Resume home medications.  3.  Hyperlipidemia: Diet controlled.  Not on treatment.  4.  GERD:Given a dose of Pepcid in the ER.  She has significant symptoms.  Will start on PPI.    DVT prophylaxis: Lovenox Code Status: Full code Family Communication: Daughter at bedside Disposition Plan:  Home anticipated tomorrow Consults called: None Admission status: Observation   Dorcas Carrow MD [facility] Pager [phone]  If 7PM-7AM, please contact night-coverage [email or web address] Password [facility]  06/24/2018, 11:29 AM

## 2018-06-24 NOTE — Progress Notes (Signed)
Pt. Having new complaint of restlessness and can't fall asleep in bed. States doesn't take medication for sleep at home. Requesting a PRN. On call MD made aware.

## 2018-06-24 NOTE — ED Provider Notes (Signed)
[facility] EMERGENCY DEPARTMENT Provider Note   CSN: [id number] Arrival date & time: 06/24/18  1594    History   Chief Complaint Chief Complaint  Patient presents with  . Arm Pain  . Chest Pain    HPI Heather Andrews is a 78 y.o. female.     HPI   A 78 year old patient with a history of hypertension and hypercholesterolemia presents for evaluation of chest pain. Initial onset of pain was more than 6 hours ago. Began having "heartburn" on Friday and numbness in L arm this morning. The patient's chest pain is not worse with exertion. The patient complains of nausea. The patient's chest pain is middle- or left-sided, is not well-localized, is not described as heaviness/pressure/tightness, is not sharp and does radiate to the L arm. The patient denies diaphoresis. The patient has a family history of coronary artery disease in a first-degree relative with father have a MI at 60 years old. The patient has no history of stroke, has no history of peripheral artery disease, has not smoked in the past 90 days, denies any history of treated diabetes and does not have an elevated BMI (>=30).   Past Medical History:  Diagnosis Date  . Cataracts, bilateral   . Elevated cholesterol   . History of shingles   . Hypertension   . Osteopenia 02/2017   T score -1.6 FRAX 17% / 3.3% stable from prior DEXA  . Stress incontinence, female    USI    Patient Active Problem List   Diagnosis Date Noted  . Hypertension   . Elevated cholesterol   . Cataracts, bilateral   . History of shingles   . Stress incontinence, female   . Osteopenia     Past Surgical History:  Procedure Laterality Date  . BASAL CELL CARCINOMA EXCISION    . CATARACT EXTRACTION    . Cyst removed from chest    . 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 Authorizing Provider  amLODipine (NORVASC) 5 MG tablet Take 5 mg by mouth daily.    [provider]  Calcium Carb-Cholecalciferol (CALCIUM 1000 + D PO) Take by mouth.    [provider]  Cholecalciferol (VITAMIN D PO) Take by mouth.    [provider]  hydrochlorothiazide (MICROZIDE) 12.5 MG capsule Take 12.5 mg by mouth daily. 02/21/15   [provider]  mirabegron ER (MYRBETRIQ) 50 MG TB24 tablet Take 50 mg by mouth every evening.    [provider]  Multiple Vitamin (MULTIVITAMIN) tablet Take 1 tablet by mouth daily.    [provider]    Family History Family History  Problem Relation Age of Onset  . Hypertension Mother   . Heart disease Paternal Grandmother   . Heart disease Paternal Grandfather   . Cancer Brother        Leukemia    Social History Social  History   Tobacco Use  . Smoking status: Former Games developermoker  . Smokeless tobacco: Never Used  Substance Use Topics  . Alcohol use: Yes    Comment: occassionally  . Drug use: No     Allergies   Patient has no known allergies.   Review of Systems Review of Systems  All systems reviewed and negative, other than as noted in HPI.  Physical Exam Updated Vital Signs BP (!) 149/63   Pulse 69   Temp (!) 97.5 F (36.4 C) (Oral)   Resp 15   SpO2 98%   Physical Exam Vitals signs and nursing note reviewed.  Constitutional:      General: She is not in acute distress.    Appearance: She is well-developed.  HENT:     Head: Normocephalic and atraumatic.  Eyes:     General:        Right eye: No discharge.        Left eye: No discharge.     Conjunctiva/sclera: Conjunctivae normal.  Neck:     Musculoskeletal: Neck supple.  Cardiovascular:     Rate and Rhythm: Normal rate and regular rhythm.     Heart sounds: Normal heart sounds. No murmur. No  friction rub. No gallop.   Pulmonary:     Effort: Pulmonary effort is normal. No respiratory distress.     Breath sounds: Normal breath sounds.  Abdominal:     General: There is no distension.     Palpations: Abdomen is soft.     Tenderness: There is no abdominal tenderness.  Musculoskeletal:        General: No tenderness.     Comments: Lower extremities symmetric as compared to each other. No calf tenderness. Negative Homan's. No palpable cords.   Skin:    General: Skin is warm and dry.  Neurological:     Mental Status: She is alert.  Psychiatric:        Behavior: Behavior normal.        Thought Content: Thought content normal.      ED Treatments / Results  Labs (all labs ordered are listed, but only abnormal results are displayed) Labs Reviewed  BASIC METABOLIC PANEL - Abnormal; Notable for the following components:      Result Value   Glucose, Bld 119 (*)    All other components within normal limits  CBC  TROPONIN I  TROPONIN I  TROPONIN I  I-STAT TROPONIN, ED    EKG EKG Interpretation  Date/Time:  Sunday June 24 2018 08:45:31 EDT Ventricular Rate:  67 PR Interval:  174 QRS Duration: 60 QT Interval:  372 QTC Calculation: 393 R Axis:   85 Text Interpretation:  Normal sinus rhythm Nonspecific ST abnormality No old tracing to compare Confirmed by Raeford RazorKohut, Lyndsi Altic [phone]([id number]) on 06/24/2018 10:25:52 AM   Radiology Dg Chest 2 View  Result Date: 06/24/2018 CLINICAL DATA:  Left arm pain and tingling EXAM: CHEST - 2 VIEW COMPARISON:  07/19/2013 FINDINGS: Normal heart size and mediastinal contours. Right cardiophrenic sulcus fat pad. Artifact from EKG leads. There is no edema, consolidation, effusion, or pneumothorax. IMPRESSION: No evidence of active disease. Electronically Signed   By: Marnee SpringJonathon  Watts M.D.   On: 06/24/2018 09:55    Procedures Procedures (including critical care time)  Medications Ordered in ED Medications  sodium chloride flush (NS) 0.9 % injection 3  mL (has no administration in time range)     Initial Impression / Assessment and Plan / ED Course  I have  reviewed the triage vital signs and the nursing notes.  Pertinent labs & imaging results that were available during my care of the patient were reviewed by me and considered in my medical decision making (see chart for details).     22VV with CP. Not low risk. Admit for ACS r/o.   Final Clinical Impressions(s) / ED Diagnoses   Final diagnoses:  Chest pain, unspecified type    ED Discharge Orders    None       Raeford Razor, MD 06/26/18 1356

## 2018-06-25 DIAGNOSIS — I10 Essential (primary) hypertension: Secondary | ICD-10-CM | POA: Diagnosis not present

## 2018-06-25 DIAGNOSIS — K219 Gastro-esophageal reflux disease without esophagitis: Secondary | ICD-10-CM

## 2018-06-25 DIAGNOSIS — E785 Hyperlipidemia, unspecified: Secondary | ICD-10-CM | POA: Diagnosis not present

## 2018-06-25 DIAGNOSIS — R079 Chest pain, unspecified: Secondary | ICD-10-CM

## 2018-06-25 DIAGNOSIS — R0789 Other chest pain: Secondary | ICD-10-CM | POA: Diagnosis not present

## 2018-06-25 DIAGNOSIS — E7800 Pure hypercholesterolemia, unspecified: Secondary | ICD-10-CM | POA: Diagnosis not present

## 2018-06-25 LAB — TROPONIN I: Troponin I: <0.03 ng/mL (ref <0.03)

## 2018-06-25 MED ORDER — ALUM & MAG HYDROXIDE-SIMETH 200-200-20 MG/5ML PO SUSP
30.0000 mL | Freq: Once | ORAL | Status: AC
  Administered 2018-06-25: 30 mL via ORAL
  Filled 2018-06-25: qty 30

## 2018-06-25 MED ORDER — PANTOPRAZOLE SODIUM 40 MG PO TBEC: 40.0000 mg | DELAYED_RELEASE_TABLET | Freq: Every day | ORAL | 1 refills | Status: DC

## 2018-06-25 MED ORDER — ALUM & MAG HYDROXIDE-SIMETH 200-200-20 MG/5ML PO SUSP: 30.0000 mL | ORAL | Status: DC | PRN

## 2018-06-25 MED ORDER — ALUM & MAG HYDROXIDE-SIMETH 200-200-20 MG/5ML PO SUSP: 30.0000 mL | ORAL | 0 refills | Status: DC | PRN

## 2018-06-25 NOTE — Discharge Summary (Signed)
Physician Discharge Summary  Heather Andrews AXK:[id number] DOB: November 19, 1940 DOA: 06/24/2018  PCP: Shirlean Mylar, MD  Admit date: 06/24/2018 Discharge date: 06/25/2018  Time spent: 45 minutes  Recommendations for Outpatient Follow-up:  1. Follow up with PCP 1-2 weeks for evaluation of symptoms and post hospitalization visit. May want to consider OP stress test.   Discharge Diagnoses:  Principal Problem:   Chest pain Active Problems:   Hypertension   Elevated cholesterol   Discharge Condition: good  Diet recommendation: heart healthy  Filed Weights   06/24/18 1750 06/25/18 0459  Weight: 73.6 kg 71.4 kg    History of present illness:  Heather Andrews is a 78 y.o. female with medical history significant of Hypertension, hyperlipidemia not on treatment otherwise good health presented to the emergency room on 06/24/18 with chest pain described as heartburn for 2 days.  According to the patient, no similar chest pain in the past.  Denied any history of angina or shortness of breath.  Insidious in onset, started 48 hours prior, described as pressure-like sensation, intermittent, initially it was on her left anterior chest, mild intensity, later on went to her left shoulder blade.  on presentation she also felt some numbness on her left arm that improved quickly.  On arrival to ER, she only had left shoulder blade pain.No fever or chills.  No cough cold or flulike symptoms.  No recent travel.  No sick contacts.  Her father had MI when he was 75, however patient is 25 now with no history of MI.  Hospital Course:  1.  Chest pain and left shoulder pain: Mostly atypical.  More than 48 hours of pain with negative EKG and biomarkers. No events on tele, chest pain no evidence of active disease. Aspirin 324 given in the ER. No further chest pain. Will follow up with PCP to discuss OP stress test  2.  Hypertension: controlled. .  3.  Hyperlipidemia: Diet controlled.  .  4.  GERD:Given a dose of Pepcid  in the ER.  She had significant symptoms. Complained indigestion after breakfast day of discharge. Provided with PPI daily for 30 days.   Procedures:  Consultations:    Discharge Exam: Vitals:   06/24/18 1952 06/25/18 0459  BP: (!) 147/68 (!) 129/59  Pulse: 80 82  Resp: 18 18  Temp: 98 F (36.7 C) 98 F (36.7 C)  SpO2: 96% 94%    General: sitting on side of bed no acute distress Cardiovascular: rrr no mgr no LE edema Respiratory: normal effort BS clear bilaterally no wheeze  Discharge Instructions   Discharge Instructions    Call MD for:  difficulty breathing, headache or visual disturbances   Complete by:  As directed    Call MD for:  persistant dizziness or light-headedness   Complete by:  As directed    Call MD for:  persistant nausea and vomiting   Complete by:  As directed    Call MD for:  severe uncontrolled pain   Complete by:  As directed    Diet - low sodium heart healthy   Complete by:  As directed    Discharge instructions   Complete by:  As directed    Take medications as directed. Follow up with PCP 1-2 weeks for post hospitalization visit. Discuss need for OP stress test. If symptoms return come back to ED   Increase activity slowly   Complete by:  As directed      Allergies as of 06/25/2018  Reactions   Ephedrine-guaifenesin       Medication List    TAKE these medications   albuterol 108 (90 Base) MCG/ACT inhaler Commonly known as:  PROVENTIL HFA;VENTOLIN HFA Inhale 2 puffs into the lungs every 6 (six) hours as needed for wheezing or shortness of breath.   alum & mag hydroxide-simeth 200-200-20 MG/5ML suspension Commonly known as:  MAALOX/MYLANTA Take 30 mLs by mouth every 2 (two) hours as needed for indigestion or heartburn.   amLODipine 5 MG tablet Commonly known as:  NORVASC Take 5 mg by mouth daily.   hydrochlorothiazide 12.5 MG capsule Commonly known as:  MICROZIDE Take 12.5 mg by mouth daily.   Myrbetriq 50 MG Tb24  tablet Generic drug:  mirabegron ER Take 50 mg by mouth daily.   pantoprazole 40 MG tablet Commonly known as:  PROTONIX Take 1 tablet (40 mg total) by mouth daily. Start taking on:  June 26, 2018      Allergies  Allergen Reactions  . Ephedrine-Guaifenesin    Follow-up Information    Shirlean Mylar, MD. Go on 07/04/2018.   Specialty:  Family Medicine Why:  @11 :15am Contact information: [address] [place] [address] [phone]            The results of significant diagnostics from this hospitalization (including imaging, microbiology, ancillary and laboratory) are listed below for reference.    Significant Diagnostic Studies: Dg Chest 2 View  Result Date: 06/24/2018 CLINICAL DATA:  Left arm pain and tingling EXAM: CHEST - 2 VIEW COMPARISON:  07/19/2013 FINDINGS: Normal heart size and mediastinal contours. Right cardiophrenic sulcus fat pad. Artifact from EKG leads. There is no edema, consolidation, effusion, or pneumothorax. IMPRESSION: No evidence of active disease. Electronically Signed   By: Marnee Spring M.D.   On: 06/24/2018 09:55    Microbiology: No results found for this or any previous visit (from the past 240 hour(s)).   Labs: Basic Metabolic Panel: Recent Labs  Lab 06/24/18 0904  NA 139  K 3.7  CL 100  CO2 26  GLUCOSE 119*  BUN 15  CREATININE 0.79  CALCIUM 9.6   Liver Function Tests: No results for input(s): AST, ALT, ALKPHOS, BILITOT, PROT, ALBUMIN in the last 168 hours. No results for input(s): LIPASE, AMYLASE in the last 168 hours. No results for input(s): AMMONIA in the last 168 hours. CBC: Recent Labs  Lab 06/24/18 0904  WBC 9.8  HGB 13.0  HCT 41.5  MCV 89.8  PLT 280   Cardiac Enzymes: Recent Labs  Lab 06/24/18 1145 06/24/18 1816 06/24/18 2317  TROPONINI <0.03 <0.03 <0.03   BNP: BNP (last 3 results) No results for input(s): BNP in the last 8760 hours.  ProBNP (last 3 results) No results for  input(s): PROBNP in the last 8760 hours.  CBG: No results for input(s): GLUCAP in the last 168 hours.     SignedGwenyth Bender NP [facility] 06/25/2018, 10:07 AM

## 2019-01-24 ENCOUNTER — Encounter: Payer: Self-pay | Admitting: Gynecology

## 2019-03-25 ENCOUNTER — Other Ambulatory Visit: Payer: Self-pay

## 2019-03-26 ENCOUNTER — Encounter: Payer: Self-pay | Admitting: Gynecology

## 2019-03-26 ENCOUNTER — Ambulatory Visit (INDEPENDENT_AMBULATORY_CARE_PROVIDER_SITE_OTHER): Payer: Medicare Other | Admitting: Gynecology

## 2019-03-26 VITALS — BP 124/80 | Ht 60.0 in | Wt 173.0 lb

## 2019-03-26 DIAGNOSIS — Z01419 Encounter for gynecological examination (general) (routine) without abnormal findings: Secondary | ICD-10-CM | POA: Diagnosis not present

## 2019-03-26 DIAGNOSIS — N952 Postmenopausal atrophic vaginitis: Secondary | ICD-10-CM

## 2019-03-26 DIAGNOSIS — M8580 Other specified disorders of bone density and structure, unspecified site: Secondary | ICD-10-CM

## 2019-03-26 NOTE — Progress Notes (Signed)
    Ladona Rosten Cerutti 1940-07-04 [id number]        78 y.o.  G3P3003 for breast and pelvic exam  Past medical history,surgical history, problem list, medications, allergies, family history and social history were all reviewed and documented as reviewed in the EPIC chart.  ROS:  Performed with pertinent positives and negatives included in the history, assessment and plan.   Additional significant findings : None   Exam: Caryn Bee assistant Vitals:   03/26/19 0914  BP: 124/80  Weight: 173 lb (78.5 kg)  Height: 5' (1.524 m)   Body mass index is 33.79 kg/m.  General appearance:  Normal affect, orientation and appearance. Skin: Grossly normal HEENT: Without gross lesions.  No cervical or supraclavicular adenopathy. Thyroid normal.  Lungs:  Clear without wheezing, rales or rhonchi Cardiac: RR, without RMG Abdominal:  Soft, nontender, without masses, guarding, rebound, organomegaly or hernia Breasts:  Examined lying and sitting without masses, retractions, discharge or axillary adenopathy. Pelvic:  Ext, BUS, Vagina: With atrophic changes  Cervix: With atrophic changes  Uterus: Occult to palpate but no gross masses or tenderness  Adnexa: Without masses or tenderness    Anus and perineum: Normal   Rectovaginal: Normal sphincter tone without palpated masses or tenderness.    Assessment/Plan:  78 y.o. G68P3003 female for breast and pelvic exam  1. Postmenopausal.  No significant menopausal symptoms or any vaginal bleeding. 2. Osteopenia.  DEXA 2018 T score -1.6 started on Prolia due to increased FRAX and subsequently switched to Reclast x5 years.  Doing well with this.  Recheck DEXA now.  Discussed possible drug-free holiday based on these results.  She will follow-up for the DEXA and treatment discussion. 3. Mammography scheduled end of this month.  Breast exam normal today. 4. Pap smear 2011.  No Pap smear done today.  No history of abnormal Pap smears.  We both agree to stop screening  per current screening guidelines. 5. Colonoscopy never.  Patient refuses colonoscopy and accepts the risk of colon cancer. 6. Health maintenance.  No routine lab work done as patient does this elsewhere.  Follow-up 1 year, sooner as needed.   Anastasio Auerbach MD, 9:44 AM 03/26/2019

## 2019-03-26 NOTE — Patient Instructions (Signed)
Follow-up for the bone density as scheduled.  Follow-up in 1 year for annual exam 

## 2019-04-29 ENCOUNTER — Other Ambulatory Visit: Payer: Self-pay

## 2019-04-30 ENCOUNTER — Ambulatory Visit (INDEPENDENT_AMBULATORY_CARE_PROVIDER_SITE_OTHER): Payer: Medicare Other

## 2019-04-30 ENCOUNTER — Other Ambulatory Visit: Payer: Self-pay | Admitting: Gynecology

## 2019-04-30 DIAGNOSIS — M8580 Other specified disorders of bone density and structure, unspecified site: Secondary | ICD-10-CM

## 2019-04-30 DIAGNOSIS — M8589 Other specified disorders of bone density and structure, multiple sites: Secondary | ICD-10-CM

## 2019-04-30 DIAGNOSIS — Z780 Asymptomatic menopausal state: Secondary | ICD-10-CM

## 2019-06-09 ENCOUNTER — Ambulatory Visit: Payer: BC Managed Care – PPO

## 2019-06-16 ENCOUNTER — Other Ambulatory Visit: Payer: Self-pay

## 2019-06-16 ENCOUNTER — Ambulatory Visit: Payer: Medicare Other | Attending: Internal Medicine

## 2019-06-16 DIAGNOSIS — Z23 Encounter for immunization: Secondary | ICD-10-CM | POA: Insufficient documentation

## 2019-06-16 NOTE — Progress Notes (Signed)
   Covid-19 Vaccination Clinic  Name:  Heather Andrews    MRN: [id number] DOB: 11/12/1940  06/16/2019  Heather Andrews was observed post Covid-19 immunization for 15 minutes without incidence. She was provided with Vaccine Information Sheet and instruction to access the V-Safe system.   Heather Andrews was instructed to call 911 with any severe reactions post vaccine: Marland Kitchen Difficulty breathing  . Swelling of your face and throat  . A fast heartbeat  . A bad rash all over your body  . Dizziness and weakness    Immunizations Administered    Name Date Dose VIS Date Route   Pfizer COVID-19 Vaccine 06/16/2019  8:42 AM 0.3 mL 03/29/2019 Intramuscular   Manufacturer: [employer], [employer]   Lot: [id number]   NDC: [address]-4

## 2019-07-10 ENCOUNTER — Ambulatory Visit: Payer: Medicare Other | Attending: Internal Medicine

## 2019-07-10 DIAGNOSIS — Z23 Encounter for immunization: Secondary | ICD-10-CM

## 2019-07-10 NOTE — Progress Notes (Signed)
   Covid-19 Vaccination Clinic  Name:  KAMAIYA ANTILLA    MRN: [id number] DOB: 1941/03/19  07/10/2019  Ms. Hefter was observed post Covid-19 immunization for 15 minutes without incident. She was provided with Vaccine Information Sheet and instruction to access the V-Safe system.   Ms. Bhatt was instructed to call 911 with any severe reactions post vaccine: Marland Kitchen Difficulty breathing  . Swelling of face and throat  . A fast heartbeat  . A bad rash all over body  . Dizziness and weakness   Immunizations Administered    Name Date Dose VIS Date Route   Pfizer COVID-19 Vaccine 07/10/2019  9:34 AM 0.3 mL 03/29/2019 Intramuscular   Manufacturer: [employer], [employer]   Lot: [id number]   NDC: [address]-4

## 2020-02-01 ENCOUNTER — Ambulatory Visit: Payer: Medicare Other | Attending: Internal Medicine

## 2020-02-01 DIAGNOSIS — Z23 Encounter for immunization: Secondary | ICD-10-CM

## 2020-02-01 NOTE — Progress Notes (Signed)
   Covid-19 Vaccination Clinic  Name:  Heather Andrews    MRN: [id number] DOB: 27-Aug-1940  02/01/2020  Heather Andrews was observed post Covid-19 immunization for 15 minutes without incident. She was provided with Vaccine Information Sheet and instruction to access the V-Safe system.   Heather Andrews was instructed to call 911 with any severe reactions post vaccine: Marland Kitchen Difficulty breathing  . Swelling of face and throat  . A fast heartbeat  . A bad rash all over body  . Dizziness and weakness

## 2020-03-26 ENCOUNTER — Encounter: Payer: Medicare Other | Admitting: Obstetrics and Gynecology

## 2020-04-23 ENCOUNTER — Encounter: Payer: Medicare Other | Admitting: Obstetrics and Gynecology

## 2020-06-24 ENCOUNTER — Emergency Department (HOSPITAL_BASED_OUTPATIENT_CLINIC_OR_DEPARTMENT_OTHER): Payer: Medicare Other | Admitting: Radiology

## 2020-06-24 ENCOUNTER — Emergency Department (HOSPITAL_BASED_OUTPATIENT_CLINIC_OR_DEPARTMENT_OTHER)
Admission: EM | Admit: 2020-06-24 | Discharge: 2020-06-24 | Disposition: A | Discharge status: Home / Self Care | Payer: Medicare Other | Attending: Emergency Medicine | Admitting: Emergency Medicine

## 2020-06-24 ENCOUNTER — Other Ambulatory Visit: Payer: Self-pay

## 2020-06-24 DIAGNOSIS — Z79899 Other long term (current) drug therapy: Secondary | ICD-10-CM | POA: Insufficient documentation

## 2020-06-24 DIAGNOSIS — S4991XA Unspecified injury of right shoulder and upper arm, initial encounter: Secondary | ICD-10-CM | POA: Diagnosis present

## 2020-06-24 DIAGNOSIS — Z87891 Personal history of nicotine dependence: Secondary | ICD-10-CM | POA: Diagnosis not present

## 2020-06-24 DIAGNOSIS — W01198A Fall on same level from slipping, tripping and stumbling with subsequent striking against other object, initial encounter: Secondary | ICD-10-CM | POA: Diagnosis not present

## 2020-06-24 DIAGNOSIS — Z85828 Personal history of other malignant neoplasm of skin: Secondary | ICD-10-CM | POA: Diagnosis not present

## 2020-06-24 DIAGNOSIS — S42211A Unspecified displaced fracture of surgical neck of right humerus, initial encounter for closed fracture: Secondary | ICD-10-CM | POA: Insufficient documentation

## 2020-06-24 DIAGNOSIS — S4291XA Fracture of right shoulder girdle, part unspecified, initial encounter for closed fracture: Secondary | ICD-10-CM

## 2020-06-24 DIAGNOSIS — I10 Essential (primary) hypertension: Secondary | ICD-10-CM | POA: Insufficient documentation

## 2020-06-24 MED ORDER — ACETAMINOPHEN 325 MG PO TABS
650.0000 mg | ORAL_TABLET | Freq: Once | ORAL | Status: AC
  Administered 2020-06-24: 650 mg via ORAL
  Filled 2020-06-24: qty 2

## 2020-06-24 NOTE — ED Notes (Signed)
Back from XRay

## 2020-06-24 NOTE — Discharge Instructions (Addendum)
Your x-ray shows a right humeral neck fracture.  Keep the sling on for comfort.  Follow-up with orthopedic surgery within the week.  Return immediately if you have worsening pain fevers or any additional concerns.  You can take Tylenol 500 mg to 650 mg every 6 hours as needed for pain.

## 2020-06-24 NOTE — ED Triage Notes (Signed)
Pt from home and tripped over her cat. Pt fell on her side and fell on her right shoulder. Pt denies LOC, Pt did not hit head. Pt is NOT on blood thinners. Pt states pain raidates down her shoulder to her elbow and back toward he right scapula.

## 2020-06-24 NOTE — ED Notes (Signed)
Patient transported to X-ray 

## 2020-06-24 NOTE — ED Provider Notes (Signed)
[facility] EMERGENCY DEPT Provider Note   CSN: [id number] Arrival date & time: 06/24/20  0857     History Chief Complaint  Patient presents with  . Fall    Heather Andrews is a 80 y.o. female.  Patient presents with right shoulder pain.  She states that that she was stepping up this morning she tried to step over her cat but lost her balance and fell onto her right shoulder.  Had severe pain in the right shoulder, otherwise denies headache or neck pain denies head injury to her knowledge.  Denies back pain or other extremity pain.  Currently complaining of right shoulder pain without any complaint of headache or neck pain or back pain.        Past Medical History:  Diagnosis Date  . Cataracts, bilateral   . Elevated cholesterol   . History of shingles   . Hypertension   . Osteopenia 02/2017   T score -1.6 FRAX 17% / 3.3% stable from prior DEXA  . Stress incontinence, female    USI    Patient Active Problem List   Diagnosis Date Noted  . Chest pain 06/24/2018  . Hypertension   . Elevated cholesterol   . Cataracts, bilateral   . History of shingles   . Stress incontinence, female   . Osteopenia     Past Surgical History:  Procedure Laterality Date  . BASAL CELL CARCINOMA EXCISION    . CATARACT EXTRACTION    . Cyst removed from chest    . 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 Hypertension Mother   . Heart disease Paternal Grandmother   . Heart disease Paternal Grandfather   . Cancer Brother        Leukemia    Social History   Tobacco Use  . Smoking status: Former Games developer  . Smokeless tobacco: Never Used  Vaping Use  . Vaping Use: Never used  Substance Use Topics  . Alcohol use: Yes    Comment:  occassionally  . Drug use: No    Home Medications Prior to Admission medications   Medication Sig Start Date End Date Taking? Authorizing Provider  albuterol (PROVENTIL HFA;VENTOLIN HFA) 108 (90 Base) MCG/ACT inhaler Inhale 2 puffs into the lungs every 6 (six) hours as needed for wheezing or shortness of breath.   Yes [provider]  alum & mag hydroxide-simeth (MAALOX/MYLANTA) 200-200-20 MG/5ML suspension Take 30 mLs by mouth every 2 (two) hours as needed for indigestion or heartburn. 06/25/18  Yes Black, Lesle Chris, NP  amLODipine (NORVASC) 5 MG tablet Take 5 mg by mouth daily.   Yes [provider]  hydrochlorothiazide (MICROZIDE) 12.5 MG capsule Take 12.5 mg by mouth daily. 02/21/15  Yes [provider]  mirabegron ER (MYRBETRIQ) 50 MG TB24 tablet Take 50 mg by mouth daily.    Yes [provider]    Allergies    Ephedrine-guaifenesin  Review of Systems  Review of Systems  Constitutional: Negative for fever.  HENT: Negative for ear pain.   Eyes: Negative for pain.  Respiratory: Negative for cough.   Cardiovascular: Negative for chest pain.  Gastrointestinal: Negative for abdominal pain.  Genitourinary: Negative for flank pain.  Musculoskeletal: Negative for back pain.  Skin: Negative for rash.  Neurological: Negative for headaches.    Physical Exam Updated Vital Signs BP (!) 151/65 (BP Location: Left Arm)   Pulse 72   Temp 98 F (36.7 C) (Oral)   Resp 14   Ht 5' (1.524 m)   Wt 72.6 kg   SpO2 97%   BMI 31.25 kg/m   Physical Exam Constitutional:      General: She is not in acute distress.    Appearance: Normal appearance.  HENT:     Head: Normocephalic and atraumatic.     Right Ear: External ear normal.     Left Ear: External ear normal.     Nose: Nose normal.  Eyes:     Extraocular Movements: Extraocular movements intact.  Cardiovascular:     Rate and Rhythm: Normal rate.  Pulmonary:     Effort: Pulmonary effort is normal.   Musculoskeletal:        General: Normal range of motion.     Cervical back: Normal range of motion.     Comments: Tenderness to palpation to right shoulder region.  Neurovascular intact otherwise and compartments are soft of the bilateral upper extremities. No C or T or L-spine midline step-offs or tenderness noted.  Patient able to move her torso and neck without pain or discomfort.  Neurological:     General: No focal deficit present.     Mental Status: She is alert. Mental status is at baseline.     ED Results / Procedures / Treatments   Labs (all labs ordered are listed, but only abnormal results are displayed) Labs Reviewed - No data to display  EKG None  Radiology No results found.  Procedures .Ortho Injury Treatment  Date/Time: 06/24/2020 10:10 AM Performed by: Cheryll Cockayne, MD Authorized by: Cheryll Cockayne, MD  Comments: Sling placed to the right upper extremity.  Neurovascular intact after placement of sling.      Medications Ordered in ED Medications  acetaminophen (TYLENOL) tablet 650 mg (650 mg Oral Given 06/24/20 8850)    ED Course  I have reviewed the triage vital signs and the nursing notes.  Pertinent labs & imaging results that were available during my care of the patient were reviewed by me and considered in my medical decision making (see chart for details).    MDM Rules/Calculators/A&P                          X-rays concerning for right humeral neck fracture.  Patient placed in a sling.  Norvasc intact after sling placement.  Advised follow-up with orthopedic surgery within the week.  Advised me return for worsening pain fevers or any additional concerns.  Final Clinical Impression(s) / ED Diagnoses Final diagnoses:  Closed fracture of right shoulder, initial encounter    Rx / DC Orders ED Discharge Orders    None       Cheryll Cockayne, MD 06/24/20 1010

## 2020-06-29 ENCOUNTER — Other Ambulatory Visit: Payer: Self-pay | Admitting: Orthopedic Surgery

## 2020-06-29 DIAGNOSIS — M25511 Pain in right shoulder: Secondary | ICD-10-CM

## 2020-07-11 ENCOUNTER — Other Ambulatory Visit: Payer: Self-pay

## 2020-07-11 ENCOUNTER — Ambulatory Visit
Admission: RE | Admit: 2020-07-11 | Discharge: 2020-07-11 | Disposition: A | Discharge status: Home / Self Care | Payer: Medicare Other | Source: Ambulatory Visit | Attending: Orthopedic Surgery | Admitting: Orthopedic Surgery

## 2020-07-11 DIAGNOSIS — M25511 Pain in right shoulder: Secondary | ICD-10-CM

## 2021-02-11 ENCOUNTER — Telehealth: Payer: Self-pay | Admitting: *Deleted

## 2021-02-11 NOTE — Telephone Encounter (Signed)
I received a voicemail from this patient stating that she was looking for a new geriatrician/ primary care.  Will forward to MD to review.  We can likely see her for a geri evaluation but unsure of establishing care, as the clinic as a whole has put a hold on accepting new patients.  Perley Arthurs,CMA

## 2021-02-16 NOTE — Telephone Encounter (Signed)
I am glad to see her for the geri evaluation, then we could see about her joining the practice.

## 2021-02-23 NOTE — Telephone Encounter (Signed)
LM for patient to call back so we can discuss geri appt and make an appt.  Satsuki Zillmer,CMA

## 2021-02-23 NOTE — Telephone Encounter (Signed)
Patient returns call to nurse line. Patient apologizes she was out running errands when [place] called her earlier. Patient reports she is home now and will be able to take your call.    [phone]

## 2021-03-18 ENCOUNTER — Ambulatory Visit (INDEPENDENT_AMBULATORY_CARE_PROVIDER_SITE_OTHER): Payer: Medicare Other | Admitting: Family Medicine

## 2021-03-18 ENCOUNTER — Other Ambulatory Visit: Payer: Self-pay

## 2021-03-18 ENCOUNTER — Ambulatory Visit (INDEPENDENT_AMBULATORY_CARE_PROVIDER_SITE_OTHER): Payer: Medicare Other

## 2021-03-18 VITALS — BP 145/53 | HR 75 | Ht 60.0 in | Wt 164.0 lb

## 2021-03-18 DIAGNOSIS — R413 Other amnesia: Secondary | ICD-10-CM | POA: Diagnosis present

## 2021-03-18 DIAGNOSIS — Z23 Encounter for immunization: Secondary | ICD-10-CM | POA: Diagnosis not present

## 2021-03-18 DIAGNOSIS — M8580 Other specified disorders of bone density and structure, unspecified site: Secondary | ICD-10-CM | POA: Diagnosis not present

## 2021-03-18 DIAGNOSIS — H9190 Unspecified hearing loss, unspecified ear: Secondary | ICD-10-CM | POA: Diagnosis not present

## 2021-03-18 DIAGNOSIS — K219 Gastro-esophageal reflux disease without esophagitis: Secondary | ICD-10-CM | POA: Insufficient documentation

## 2021-03-18 DIAGNOSIS — L814 Other melanin hyperpigmentation: Secondary | ICD-10-CM | POA: Diagnosis not present

## 2021-03-18 DIAGNOSIS — L570 Actinic keratosis: Secondary | ICD-10-CM

## 2021-03-18 DIAGNOSIS — F3342 Major depressive disorder, recurrent, in full remission: Secondary | ICD-10-CM

## 2021-03-18 DIAGNOSIS — S4991XS Unspecified injury of right shoulder and upper arm, sequela: Secondary | ICD-10-CM

## 2021-03-18 DIAGNOSIS — E559 Vitamin D deficiency, unspecified: Secondary | ICD-10-CM | POA: Insufficient documentation

## 2021-03-18 DIAGNOSIS — R7303 Prediabetes: Secondary | ICD-10-CM | POA: Insufficient documentation

## 2021-03-18 HISTORY — DX: Major depressive disorder, recurrent, in full remission: F33.42

## 2021-03-18 NOTE — Progress Notes (Addendum)
Provider:  [name], MD Location:      Place of Service:     PCP: Shirlean Mylar, MD (Inactive) Patient Care Team: Shirlean Mylar, MD (Inactive) as PCP - General (Family Medicine)  Extended Emergency Contact Information Primary Emergency Contact: Ezzard Standing [place] Home Phone: [phone] Mobile Phone: [phone] Relation: Daughter  Code Status: DNR Goals of Care: Advanced Directive information Advanced Directives 06/24/2020  Does Patient Have a Medical Advance Directive? Yes  Type of Estate agent of [place];Living will  Does patient want to make changes to medical advance directive? -  Copy of Healthcare Power of Attorney in Chart? No - copy requested   SDoH updated from patient's Geriatric Assessment Questionnaire  [facility]:   Patient is alone Primary caregiver:  self, though patient's daughter lives next door to her Patient's Currently living arrangement:  Townhouse, living alone Patient information was obtained from historical medical records, lab reports, and summary reports of Engineer, structural   . History/Exam limitations:  none Primary Care Provider:   C. Hyman Hopes, MD (patient reports Dr Hyman Hopes is no longer with the [facility] practice) Referring provider:  Self Reason for referral:  memory concers  HPI by problems:  Chief Complaint  Patient presents with   geriatric assessment   Memory Loss    Cognitive impairment concern  Are there problems with thinking?  Cognition domains: Memory difficulties  When were the changes first noticed?  No certain time of onset because so gradual a change  Did this change occur abruptly or gradually?  Very gradual    Compared to 5 to 10 years ago, how is the patient at:  Less interested in hobbies or previously enjoyed activities?  no  Problem remembering things about family and friends e.g. names,  occupations, birthdays, addresses?   no  Problem remembering conversations or news events a few days later?  no  Problem remembering what day and month it is? no  Problem with losing things?  no  Problem learning to use a new gadget or machine around the house, e.g., cell phones, computer, microwave, remote control?  Does not want to get a smart phone, but not clear this is because she would not be able to learn use of the device  Problem handling financial matters, e.g. their pension, checking, credit cards, dealing with the bank?  no  Problem with getting lost in familiar places?  no     PHQ-9: Flowsheet Row Office Visit from 03/18/2021 in [facility]  PHQ-9 Total Score 8        Outpatient Encounter Medications as of 03/18/2021  Medication Sig   albuterol (PROVENTIL HFA;VENTOLIN HFA) 108 (90 Base) MCG/ACT inhaler Inhale 2 puffs into the lungs every 6 (six) hours as needed for wheezing or shortness of breath.   amLODipine (NORVASC) 5 MG tablet Take 5 mg by mouth daily.   calcium carbonate (OSCAL) 1500 (600 Ca) MG TABS tablet Take 600 mg by mouth 2 (two) times daily.   Cholecalciferol (VITAMIN D3) 125 MCG (5000 UT) CAPS Take 125 mcg by mouth daily.   Cyanocobalamin (VITAMIN B-12 PO) Take 1 tablet by mouth daily.   denosumab (PROLIA) 60 MG/ML SOSY injection Inject 60 mg into the skin every 6 (six) months.   GEMTESA 75 MG TABS Take 1 tablet by mouth daily.   hydrochlorothiazide (MICROZIDE) 12.5 MG capsule Take 12.5 mg by mouth daily.   Menaquinone-7 (VITAMIN K2) 100 MCG CAPS Take 100  mcg by mouth daily.   [DISCONTINUED] alum & mag hydroxide-simeth (MAALOX/MYLANTA) 200-200-20 MG/5ML suspension Take 30 mLs by mouth every 2 (two) hours as needed for indigestion or heartburn.   [DISCONTINUED] mirabegron ER (MYRBETRIQ) 50 MG TB24 tablet Take 50 mg by mouth daily.    No facility-administered encounter medications on file as of 03/18/2021.    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 03/19/2021   Prediabetes 03/18/2021   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   Vitamin D deficiency 03/18/2021   Gastroesophageal reflux disease 03/18/2021   Primary osteoarthritis of left knee 07/03/2017   Hypertension    Elevated cholesterol    Stress incontinence, female    Osteopenia    Past Medical History:  Diagnosis Date   Cataracts, bilateral    Elevated cholesterol    History of shingles    Hypertension    Osteopenia 02/2017   T score -1.6 FRAX 17% / 3.3% stable from prior DEXA   Stress incontinence, female    USI   Past Surgical History:  Procedure Laterality Date   BASAL CELL CARCINOMA EXCISION     CATARACT EXTRACTION     Cyst removed from chest     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"   Family History  Problem Relation Age of Onset   Hypertension Mother    Heart disease Paternal Grandmother    Heart disease Paternal Grandfather    Cancer Brother        Leukemia   Social History   Socioeconomic History   Marital status: Widowed    Spouse name: Not on file   Number of children: 3   Years of education: 58   Highest education level: Not on file  Occupational History   Occupation: Tourist information centre manager    Comment: Retired  Tobacco Use   Smoking status: Former   Smokeless tobacco: Never  Building services engineer Use: Never used  Substance and Sexual Activity   Alcohol use: Yes    Comment: occassionally   Drug use: No   Sexual activity: Never    Comment: 1st intercourse 80 yo-Fewer than 5 partners  Other Topics Concern   Not on file  Social History Narrative   Widowed, lives alone though a daughter lives next door.   3 children: [name]; [name], Lawernce Pitts   Activities: reading, interacting with her two cats, playing cards, corresponding with others (email, snail mail), needlework, cooking   Patient indicated on Geri Assessment questionnaire that she would want her  daughter, Clelia Croft to make medical decisions on her behalf should she be unable. [phone]).  The patient gives permission to speak to Ms Romnet about issue around the patient's health.      Patient lives in a townhouse.  13 steps to get into home.  Home has two levels.    Assists Devices in home: Grab Bars   Regular exercise: Yoga once a week   Transportation: Patient drives   Social Determinants of Health   Financial Resource Strain: Low Risk    Difficulty of Paying Living Expenses: Not hard at all  Food Insecurity: No Food Insecurity   Worried About Programme researcher, broadcasting/film/video in the Last Year: Never true   Barista in the Last Year: Never true  Transportation Needs: No Transportation Needs   Lack of Transportation (Medical): No   Lack of Transportation (Non-Medical):  No  Physical Activity: Unknown   Days of Exercise per Week: 0 days   Minutes of Exercise per Session: Not on file  Stress: Not on file  Social Connections: Moderately Integrated   Frequency of Communication with Friends and Family: More than three times a week   Frequency of Social Gatherings with Friends and Family: More than three times a week   Attends Religious Services: More than 4 times per year   Active Member of [employer] or Organizations: Yes   Attends Banker Meetings: More than 4 times per year   Marital Status: Widowed    Cardiovascular Risk Factors: Hypertension  Educational History: 18 years formal education Personal History of Seizures: No -  Personal History of Stroke: No -    Basic Activities of Daily Living  Dressing: Self-care Eating: Self-care Ambulation: Self-care Toileting: Self-care Bathing: Self-care  Instrumental Activities of Daily Living Shopping: Self-care House/Yard Work: Self-care Administration of medications: Self-care Finances: Self-care Telephone: Self-care Transportation: Self-care  Mobility Assist Devices:  none  Caregivers in home: none, though  daughter lives next door to pt  FALLS in last five office visits:  Fall Risk  03/18/2021  Falls in the past year? 1  Number falls in past yr: 0  Injury with Fall? 0    Health Maintenance reviewed: Immunization History  Administered Date(s) Administered   Fluad Quad(high Dose 65+) 03/18/2021   Influenza Split 01/17/2013   Influenza,inj,Quad PF,6+ Mos 02/26/2016, 04/03/2017, 04/25/2018, 01/25/2019   Influenza,inj,quad, With Preservative 01/09/2014, 02/19/2015, 01/16/2017   PFIZER(Purple Top)SARS-COV-2 Vaccination 06/16/2019, 07/10/2019, 02/01/2020   Pfizer Covid-19 Vaccine Bivalent Booster 13yrs & up 03/18/2021   Pneumococcal Conjugate-13 02/13/2014   Pneumococcal Polysaccharide-23 07/27/2006   Td 05/12/2016   Health Maintenance Topics with due status: Overdue     Topic Date Due   Hepatitis C Screening Never done   Zoster Vaccines- Shingrix Never done    Geriatric Syndromes: Constipation no    Incontinence yes  Nocturia: yes Dizziness no   Syncope no  Visual Impairment no   Hearing impairment yes Dentures problems: no Impaired Memory or Cognition no   Sleep problems EMA with DFA but adquate sleep.  No significant daytime fatigue   Drug Misadventure: no      Vital Signs Weight: 164 lb (74.4 kg) Body mass index is 32.03 kg/m. CrCl cannot be calculated (Patient's most recent lab result is older than the maximum 21 days allowed.). Body surface area is 1.77 meters squared. Vitals:   03/18/21 1339  BP: (!) 145/53  Pulse: 75  Weight: 164 lb (74.4 kg)  Height: 5' (1.524 m)   Wt Readings from Last 3 Encounters:  03/18/21 164 lb (74.4 kg)  06/24/20 160 lb (72.6 kg)  03/26/19 173 lb (78.5 kg)   Vision Screening   Right eye Left eye Both eyes  Without correction 20/30 20/30 20/30   With correction       Physical Examination:  VS reviewed Physical Exam  Vitals:   03/18/21 1339  BP: (!) 145/53  Pulse: 75    HEENT: Bilateral EAC adequately patent, I.e., able  to see TMs General physical exam: well-dressed and groomed. Pleasant and cooperative. Able to rise from chair easily without using arms Negative Rhomberg, passed tandem stand test, no pronator drift, intact touch in hands, normal FTN bil, Pupil reactive to light, normal grimace, tongue alignment and motion, no dysmetria or diadochokinesis with UEs.  Gait: full swing phase, no shortened stance phase, no swing passed midline, good speed, slight  wobble with a rapid 180 turn but able to recover quickly.  No tremor, No increase tone  MSK: Patient unable abduct or flex right shoulder above 90 degrees.  FROM in left shoulder  Mini-Mental State Examination or Montreal Cognitive Assessment:  Patient did not require additional cues or prompts to complete tasks. Patient was cooperative and attentive to testing tasks Patient did  appear motivated to perform well    No flowsheet data found.  No flowsheet data found.      Montreal Cognitive Assessment  03/19/2021  Visuospatial/ Executive (0/5) 5  Naming (0/3) 3  Attention: Read list of digits (0/2) 2  Attention: Read list of letters (0/1) 1  Attention: Serial 7 subtraction starting at 100 (0/3) 3  Language: Repeat phrase (0/2) 2  Language : Fluency (0/1) 0  Abstraction (0/2) 2  Delayed Recall (0/5) 5  Orientation (0/6) 6  Total 29  Adjusted Score (based on education) 29     Labs No components found for: VITAMIND  No results found for: VITAMINB12  No results found for: FOLATE  No results found for: TSH  No results found for: RPR  No results found for: HIV    Chemistry      Component Value Date/Time   NA 139 06/24/2018 0904   K 3.7 06/24/2018 0904   CL 100 06/24/2018 0904   CO2 26 06/24/2018 0904   BUN 15 06/24/2018 0904   CREATININE 0.79 06/24/2018 0904   CREATININE 0.77 07/28/2017 0853      Component Value Date/Time   CALCIUM 9.6 06/24/2018 0904       CrCl cannot be calculated (Patient's most recent lab result is  older than the maximum 21 days allowed.).   No results found for: HGBA1C   @10RELATIVEDAYS @ Vision Screening   Right eye Left eye Both eyes  Without correction 20/30 20/30 20/30   With correction      Lab Results  Component Value Date   WBC 9.8 06/24/2018   HGB 13.0 06/24/2018   HCT 41.5 06/24/2018   MCV 89.8 06/24/2018   PLT 280 06/24/2018    No results found for this or any previous visit (from the past 24 hour(s)).  Imaging No neuroimaging available    Advanced Directives Code Status: pending Advance Directives: pending  ------------------------------------------------------------------------------------------------------------------------------------------------------------------------------------------------------------------------------------------------------------------------------------------------------------------------------------------------------------------------------------------------------------------------------------------------------------------------------------------------------------  Assessment and Plan: Please see individual consultation notes from physical therapy, pharmacy and social work for today.    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       Primary Contact: Extended Emergency Contact Information Primary Emergency  Contact: 08/24/2018 States of 08/24/2018 Home Phone: [phone] Mobile Phone: [phone] Relation: Daughter  Patient to Follow up with  Dr.Yoshiko Keleher or [facility] in 4  week(s)  50 minutes face to face were spent in total with interdisciplinary discussion, patient and caretaker counseling and coordination of care took more than 20 minutes. The Geriatric interdisciplinary team meet to discuss the patient's assessment, problem list, and recommendations.  The interdisciplinary team consisted of representatives from medicine, pharmacy,.. The interdisciplinary team meet with the patient and caretakers to review the team's findings, assessments, and recommendations.      CPT E&M Office Visit Time Before Visit; reviewing medical records (e.g. recent visits, labs, studies): 10 minutes During Visit (F2F time): 30 minutes After Visit (discussion with family or HCP, prescribing, ordering, referring, calling result/recommendations or documenting on same day): 10 minutes Total Visit Time: 50 minutes

## 2021-03-18 NOTE — Progress Notes (Signed)
S: Pharmacy consulted to complete medication reconciliation for geriatric clinic. Patient arrives independently today in good spirits. Medication reconciliation completed by patient. Medications, allergies, and preferred pharmacy are up to date.   Medication Issues Identified: 1. Patient takes occasional Benadryl, notes that this makes her very drowsy.  2. Immunizations needed: annual influenza vaccine, COVID bivalent booster, Shingrix series. She has a history of shingles x3, with the last occurring 6-7 years ago.    Plan: 1. Recommend discontinuing Benadryl in elderly patients given cognitive effects. Recommended alternative antihistamines like Zyrtec or Claritin during allergy season.  2. She is to receive her flu and COVID booster vaccines today. She plans to get her Shingrix vaccines in the future.    Patient seen by: Pervis Hocking, PharmD - PGY2 Pharmacy Resident

## 2021-03-19 ENCOUNTER — Encounter: Payer: Self-pay | Admitting: Family Medicine

## 2021-03-19 DIAGNOSIS — R413 Other amnesia: Secondary | ICD-10-CM | POA: Insufficient documentation

## 2021-03-19 NOTE — Assessment & Plan Note (Signed)
Patient with MoCA 29/30 and independence with all independent ADLs. Decline in memory is primarily the subjective experience of the normal changes of cognition with aging.  We discussed the lifestyle changes associated with slowing age-related decline in memory.

## 2021-04-26 ENCOUNTER — Encounter: Payer: Self-pay | Admitting: Family Medicine

## 2021-04-26 DIAGNOSIS — M7500 Adhesive capsulitis of unspecified shoulder: Secondary | ICD-10-CM

## 2021-04-26 DIAGNOSIS — J309 Allergic rhinitis, unspecified: Secondary | ICD-10-CM | POA: Insufficient documentation

## 2021-04-26 DIAGNOSIS — IMO0001 Reserved for inherently not codable concepts without codable children: Secondary | ICD-10-CM | POA: Insufficient documentation

## 2021-04-26 DIAGNOSIS — N3281 Overactive bladder: Secondary | ICD-10-CM | POA: Insufficient documentation

## 2021-04-26 DIAGNOSIS — L570 Actinic keratosis: Secondary | ICD-10-CM

## 2021-04-26 DIAGNOSIS — H9190 Unspecified hearing loss, unspecified ear: Secondary | ICD-10-CM | POA: Insufficient documentation

## 2021-04-26 HISTORY — DX: Adhesive capsulitis of unspecified shoulder: M75.00

## 2021-04-26 HISTORY — DX: Actinic keratosis: L57.0

## 2021-04-27 ENCOUNTER — Ambulatory Visit (INDEPENDENT_AMBULATORY_CARE_PROVIDER_SITE_OTHER): Payer: Medicare PPO | Admitting: Family Medicine

## 2021-04-27 ENCOUNTER — Encounter: Payer: Self-pay | Admitting: Family Medicine

## 2021-04-27 ENCOUNTER — Other Ambulatory Visit: Payer: Self-pay

## 2021-04-27 VITALS — BP 132/46 | HR 72 | Ht 60.0 in

## 2021-04-27 DIAGNOSIS — H9190 Unspecified hearing loss, unspecified ear: Secondary | ICD-10-CM

## 2021-04-27 DIAGNOSIS — L570 Actinic keratosis: Secondary | ICD-10-CM | POA: Diagnosis not present

## 2021-04-27 DIAGNOSIS — N3281 Overactive bladder: Secondary | ICD-10-CM

## 2021-04-27 DIAGNOSIS — M7501 Adhesive capsulitis of right shoulder: Secondary | ICD-10-CM | POA: Diagnosis not present

## 2021-04-27 DIAGNOSIS — J449 Chronic obstructive pulmonary disease, unspecified: Secondary | ICD-10-CM | POA: Insufficient documentation

## 2021-04-27 DIAGNOSIS — R0609 Other forms of dyspnea: Secondary | ICD-10-CM | POA: Diagnosis not present

## 2021-04-27 MED ORDER — ALBUTEROL SULFATE HFA 108 (90 BASE) MCG/ACT IN AERS: 2.0000 | INHALATION_SPRAY | Freq: Four times a day (QID) | RESPIRATORY_TRACT | 5 refills | Status: DC | PRN

## 2021-04-27 NOTE — Assessment & Plan Note (Signed)
Established problem Multiple actinic keratoses that would benefit from treatment and or monitoring.  Ms Frein has seen Donzetta Starch MD (Derm) in past.  Referral made back to Dr Yetta Barre.

## 2021-04-27 NOTE — Progress Notes (Signed)
Heather Andrews is alone Sources of clinical information for visit is/are patient. Nursing assessment for this office visit was reviewed with the patient for accuracy and revision.     Previous Report(s) Reviewed: office notes  Depression screen [facility] 2/9 04/27/2021  Decreased Interest 0  Down, Depressed, Hopeless 0  PHQ - 2 Score 0  Altered sleeping 0  Tired, decreased energy 0  Change in appetite 0  Feeling bad or failure about yourself  0  Trouble concentrating 0  Moving slowly or fidgety/restless 0  Suicidal thoughts 0  PHQ-9 Score 0  Difficult doing work/chores Not difficult at all   [place] Visit from 04/27/2021 in [place] Office Visit from 03/18/2021 in [place]  Thoughts that you would be better off dead, or of hurting yourself in some way Not at all Not at all  PHQ-9 Total Score 0 8       Fall Risk  03/18/2021  Falls in the past year? 1  Number falls in past yr: 0  Injury with Fall? 0    PHQ9 SCORE ONLY 04/27/2021 03/18/2021  PHQ-9 Total Score 0 8    Adult vaccines due  Topic Date Due   TETANUS/TDAP  05/12/2026    Health Maintenance Due  Topic Date Due   Hepatitis C Screening  Never done   Zoster Vaccines- Shingrix (1 of 2) Never done      History/P.E. limitations: none  Adult vaccines due  Topic Date Due   TETANUS/TDAP  05/12/2026   There are no preventive care reminders to display for this patient.  Health Maintenance Due  Topic Date Due   Hepatitis C Screening  Never done   Zoster Vaccines- Shingrix (1 of 2) Never done     Chief Complaint  Patient presents with   Roland Earl f/u   Medication Refill    Pt stated that she needs her albuterol refill    Time visit: 30 minutes

## 2021-04-27 NOTE — Assessment & Plan Note (Signed)
Completed frozen shoulder Occupational Therapy therapy

## 2021-04-27 NOTE — Assessment & Plan Note (Signed)
Established problem Uncertain origin Uses albuterol meter dosed inhaler intermittently when feels strangled on mucus in throat.  No diagnosed history of asthma or COPD. No history of PFTs.  Able to walk about 100 yards before stopping to catch her breath.    Former smoker of 1/2 PPD starting age 81 YO.

## 2021-04-27 NOTE — Patient Instructions (Signed)
A referral to Dr Donzetta Starch (Dermatology) made.  Please let us know if you have not heard from his office within 5 working-days.  Please contact your pharmacy to start the two vaccination series covering shingles virus.    If you think about it, please mail or drop off a copy of your advanced directives and Healthcare Power of [place].  We will scan it into your medical chart.   Please set up an appointment with our Pharmacists to have your lung function tested using spirometry.

## 2021-04-28 ENCOUNTER — Encounter: Payer: Self-pay | Admitting: Family Medicine

## 2021-05-10 ENCOUNTER — Emergency Department (HOSPITAL_BASED_OUTPATIENT_CLINIC_OR_DEPARTMENT_OTHER): Payer: Medicare PPO

## 2021-05-10 ENCOUNTER — Other Ambulatory Visit: Payer: Self-pay

## 2021-05-10 ENCOUNTER — Emergency Department (HOSPITAL_BASED_OUTPATIENT_CLINIC_OR_DEPARTMENT_OTHER)
Admission: EM | Admit: 2021-05-10 | Discharge: 2021-05-10 | Disposition: A | Discharge status: Home / Self Care | Payer: Medicare PPO | Attending: Emergency Medicine | Admitting: Emergency Medicine

## 2021-05-10 ENCOUNTER — Encounter (HOSPITAL_BASED_OUTPATIENT_CLINIC_OR_DEPARTMENT_OTHER): Payer: Self-pay | Admitting: *Deleted

## 2021-05-10 DIAGNOSIS — I7143 Infrarenal abdominal aortic aneurysm, without rupture: Secondary | ICD-10-CM | POA: Insufficient documentation

## 2021-05-10 DIAGNOSIS — K449 Diaphragmatic hernia without obstruction or gangrene: Secondary | ICD-10-CM | POA: Diagnosis not present

## 2021-05-10 DIAGNOSIS — R1031 Right lower quadrant pain: Secondary | ICD-10-CM | POA: Diagnosis present

## 2021-05-10 DIAGNOSIS — R109 Unspecified abdominal pain: Secondary | ICD-10-CM

## 2021-05-10 DIAGNOSIS — Z20822 Contact with and (suspected) exposure to covid-19: Secondary | ICD-10-CM | POA: Insufficient documentation

## 2021-05-10 DIAGNOSIS — Z79899 Other long term (current) drug therapy: Secondary | ICD-10-CM | POA: Diagnosis not present

## 2021-05-10 DIAGNOSIS — I700 Atherosclerosis of aorta: Secondary | ICD-10-CM | POA: Insufficient documentation

## 2021-05-10 DIAGNOSIS — I10 Essential (primary) hypertension: Secondary | ICD-10-CM | POA: Insufficient documentation

## 2021-05-10 LAB — URINALYSIS, ROUTINE W REFLEX MICROSCOPIC
Bilirubin Urine: NEGATIVE
Glucose, UA: NEGATIVE mg/dL
Hgb urine dipstick: NEGATIVE
Ketones, ur: 15 mg/dL — AB
Nitrite: NEGATIVE
Specific Gravity, Urine: >1.046 — ABNORMAL HIGH (ref 1.005–1.030)
pH: 6 (ref 5.0–8.0)

## 2021-05-10 LAB — CBC WITH DIFFERENTIAL/PLATELET
Abs Immature Granulocytes: 0.04 10*3/uL (ref 0.00–0.07)
Basophils Absolute: 0.1 10*3/uL (ref 0.0–0.1)
Basophils Relative: 1 %
Eosinophils Absolute: 0.1 10*3/uL (ref 0.0–0.5)
Eosinophils Relative: 0 %
HCT: 40 % (ref 36.0–46.0)
Hemoglobin: 13.4 g/dL (ref 12.0–15.0)
Immature Granulocytes: 0 %
Lymphocytes Relative: 17 %
Lymphs Abs: 2 10*3/uL (ref 0.7–4.0)
MCH: 29.6 pg (ref 26.0–34.0)
MCHC: 33.5 g/dL (ref 30.0–36.0)
MCV: 88.3 fL (ref 80.0–100.0)
Monocytes Absolute: 0.9 10*3/uL (ref 0.1–1.0)
Monocytes Relative: 8 %
Neutro Abs: 9.2 10*3/uL — ABNORMAL HIGH (ref 1.7–7.7)
Neutrophils Relative %: 74 %
Platelets: 251 10*3/uL (ref 150–400)
RBC: 4.53 MIL/uL (ref 3.87–5.11)
RDW: 14.7 % (ref 11.5–15.5)
WBC: 12.3 10*3/uL — ABNORMAL HIGH (ref 4.0–10.5)
nRBC: 0 % (ref 0.0–0.2)

## 2021-05-10 LAB — LIPASE, BLOOD: Lipase: 16 U/L (ref 11–51)

## 2021-05-10 LAB — TROPONIN I (HIGH SENSITIVITY)
Troponin I (High Sensitivity): 6 ng/L (ref <18)
Troponin I (High Sensitivity): 5 ng/L (ref <18)

## 2021-05-10 LAB — COMPREHENSIVE METABOLIC PANEL
ALT: 13 U/L (ref 0–44)
AST: 18 U/L (ref 15–41)
Albumin: 4.1 g/dL (ref 3.5–5.0)
Alkaline Phosphatase: 42 U/L (ref 38–126)
Anion gap: 10 (ref 5–15)
BUN: 13 mg/dL (ref 8–23)
CO2: 25 mmol/L (ref 22–32)
Calcium: 8.6 mg/dL — ABNORMAL LOW (ref 8.9–10.3)
Chloride: 99 mmol/L (ref 98–111)
Creatinine, Ser: 0.5 mg/dL (ref 0.44–1.00)
GFR, Estimated: >60 mL/min (ref >60)
Glucose, Bld: 109 mg/dL — ABNORMAL HIGH (ref 70–99)
Potassium: 3.7 mmol/L (ref 3.5–5.1)
Sodium: 134 mmol/L — ABNORMAL LOW (ref 135–145)
Total Bilirubin: 0.7 mg/dL (ref 0.3–1.2)
Total Protein: 7.2 g/dL (ref 6.5–8.1)

## 2021-05-10 LAB — RESP PANEL BY RT-PCR (FLU A&B, COVID) ARPGX2
Influenza A by PCR: NEGATIVE
Influenza B by PCR: NEGATIVE
SARS Coronavirus 2 by RT PCR: NEGATIVE

## 2021-05-10 MED ORDER — LIDOCAINE VISCOUS HCL 2 % MT SOLN
15.0000 mL | Freq: Once | OROMUCOSAL | Status: AC
  Administered 2021-05-10: 15 mL via ORAL
  Filled 2021-05-10: qty 15

## 2021-05-10 MED ORDER — FAMOTIDINE 20 MG PO TABS: 20.0000 mg | ORAL_TABLET | Freq: Every day | ORAL | 0 refills | Status: DC

## 2021-05-10 MED ORDER — SODIUM CHLORIDE 0.9 % IV BOLUS
1000.0000 mL | Freq: Once | INTRAVENOUS | Status: AC
  Administered 2021-05-10: 1000 mL via INTRAVENOUS

## 2021-05-10 MED ORDER — ALUM & MAG HYDROXIDE-SIMETH 200-200-20 MG/5ML PO SUSP
30.0000 mL | Freq: Once | ORAL | Status: AC
  Administered 2021-05-10: 30 mL via ORAL
  Filled 2021-05-10: qty 30

## 2021-05-10 MED ORDER — FAMOTIDINE IN NACL 20-0.9 MG/50ML-% IV SOLN
20.0000 mg | Freq: Once | INTRAVENOUS | Status: AC
  Administered 2021-05-10: 20 mg via INTRAVENOUS
  Filled 2021-05-10: qty 50

## 2021-05-10 MED ORDER — IOHEXOL 300 MG/ML  SOLN
85.0000 mL | Freq: Once | INTRAMUSCULAR | Status: AC | PRN
  Administered 2021-05-10: 85 mL via INTRAVENOUS

## 2021-05-10 MED ORDER — ONDANSETRON HCL 4 MG PO TABS: 4.0000 mg | ORAL_TABLET | Freq: Four times a day (QID) | ORAL | 0 refills | Status: DC | PRN

## 2021-05-10 MED ORDER — MORPHINE SULFATE (PF) 4 MG/ML IV SOLN
4.0000 mg | Freq: Once | INTRAVENOUS | Status: AC
  Administered 2021-05-10: 4 mg via INTRAVENOUS
  Filled 2021-05-10: qty 1

## 2021-05-10 MED ORDER — LIDOCAINE 5 % EX PTCH
1.0000 | MEDICATED_PATCH | CUTANEOUS | Status: DC
  Administered 2021-05-10: 1 via TRANSDERMAL
  Filled 2021-05-10 (×2): qty 1

## 2021-05-10 MED ORDER — ONDANSETRON HCL 4 MG/2ML IJ SOLN
4.0000 mg | Freq: Once | INTRAMUSCULAR | Status: AC
Start: 2021-05-10 — End: 2021-05-10
  Administered 2021-05-10: 4 mg via INTRAVENOUS
  Filled 2021-05-10: qty 2

## 2021-05-10 MED ORDER — SUCRALFATE 1 G PO TABS: 1.0000 g | ORAL_TABLET | Freq: Three times a day (TID) | ORAL | 0 refills | Status: DC

## 2021-05-10 MED ORDER — DICYCLOMINE HCL 20 MG PO TABS: 20.0000 mg | ORAL_TABLET | Freq: Two times a day (BID) | ORAL | 0 refills | Status: DC

## 2021-05-10 NOTE — ED Notes (Signed)
Pt d/c home with daughter per MD order. Discharge summary reviewed, verbalize understanding. Ambulatory off unit. No s/s of acute distress noted. Reports daughter is discharge ride home.

## 2021-05-10 NOTE — Discharge Instructions (Addendum)

## 2021-05-10 NOTE — ED Notes (Signed)
Patient transported to CT 

## 2021-05-10 NOTE — ED Provider Notes (Signed)
[facility] EMERGENCY DEPT Provider Note   CSN: [id number] Arrival date & time: 05/10/21  4098     History  Chief Complaint  Patient presents with   Abdominal Pain    Heather Andrews is a 81 y.o. female.  This is a 81 y.o. female with significant medical history as below, including hyperlipidemia, hypertension, shingles who presents to the ED with complaint of right lower quadrant abdominal pain Location: Right lower quadrant, radiation to right flank Duration: 3 days Onset: Sudden Timing: Constant Description: Aching, shooting, sharp, burning Severity: Mild Exacerbating/Alleviating Factors: Worse with movement, torso twisting, unable to find position of comfort Associated Symptoms: Anorexia.  Reduced appetite.  Nausea without emesis, questionable chills.  No fevers. Pertinent Negatives: No chest pain, dyspnea, lightheadedness, diaphoresis, change in bowel or bladder function, rashes, suspicious oral intake or recent travel.  Context: Patient reports onset of right lower quadrant abdominal pain while at rest 3 to 4 days ago.  She took aspirin x1 without improvement to her symptoms.  Currently symptomatic.  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 Fontaine, OBGYN). 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 No language interpreter was used.  Abdominal Pain Associated symptoms: chills and nausea   Associated symptoms: no chest pain, no cough, no fever, no hematuria, no shortness of breath and no vomiting       Home Medications Prior to Admission medications   Medication Sig Start Date End Date Taking? Authorizing Provider  dicyclomine (BENTYL) 20 MG tablet Take 1 tablet (20 mg total) by mouth 2 (two) times daily. 05/10/21  Yes Tanda Rockers A, DO  famotidine (PEPCID) 20 MG tablet Take 1 tablet (20 mg total) by mouth daily for 14 days. 05/10/21 05/24/21 Yes Tanda Rockers A, DO  ondansetron (ZOFRAN) 4 MG tablet Take 1 tablet (4 mg total) by mouth every 6 (six) hours as needed for nausea or vomiting. 05/10/21  Yes Tanda Rockers A, DO  sucralfate (CARAFATE) 1 g tablet Take 1 tablet (1 g total) by mouth 4 (four) times daily -  with meals and at bedtime for 7 days. 05/10/21 05/17/21 Yes Tanda Rockers A, DO  albuterol (VENTOLIN HFA) 108 (90 Base) MCG/ACT inhaler Inhale 2 puffs into the lungs every 6 (six) hours as needed for wheezing or shortness of breath. 04/27/21   McDiarmid, Leighton Roach, MD  amLODipine (NORVASC) 5 MG tablet Take 5 mg by mouth daily.    [provider]  calcium carbonate (OSCAL)  1500 (600 Ca) MG TABS tablet Take 600 mg by mouth 2 (two) times daily.    [provider]  Cholecalciferol (VITAMIN D3) 125 MCG (5000 UT) CAPS Take 125 mcg by mouth daily.    [provider]  Cyanocobalamin (VITAMIN B-12 PO) Take 1 tablet by mouth daily.    [provider]  denosumab (PROLIA) 60 MG/ML SOSY injection Inject 60 mg into the skin every 6 (six) months.    [provider]  GEMTESA 75 MG TABS Take 1 tablet by mouth daily. 01/28/21   [provider]  hydrochlorothiazide  (MICROZIDE) 12.5 MG capsule Take 12.5 mg by mouth daily. 02/21/15   [provider]  Menaquinone-7 (VITAMIN K2) 100 MCG CAPS Take 100 mcg by mouth daily.    [provider]      Allergies    Lisinopril and Ephedrine-guaifenesin    Review of Systems   Review of Systems  Constitutional:  Positive for appetite change and chills. Negative for fever.  HENT:  Negative for facial swelling and trouble swallowing.   Eyes:  Negative for photophobia and visual disturbance.  Respiratory:  Negative for cough and shortness of breath.   Cardiovascular:  Negative for chest pain and palpitations.  Gastrointestinal:  Positive for abdominal pain and nausea. Negative for vomiting.  Endocrine: Negative for polydipsia and polyuria.  Genitourinary:  Negative for difficulty urinating and hematuria.  Musculoskeletal:  Negative for gait problem and joint swelling.  Skin:  Negative for pallor and rash.  Neurological:  Negative for syncope and headaches.  Psychiatric/Behavioral:  Negative for agitation and confusion.    Physical Exam Updated Vital Signs BP (!) 109/98    Pulse 70    Temp 98.2 F (36.8 C) (Oral)    Resp 18    Ht 5' (1.524 m)    Wt 72.6 kg    SpO2 93%    BMI 31.25 kg/m  Physical Exam Vitals and nursing note reviewed.  Constitutional:      General: She is not in acute distress.    Appearance: Normal appearance. She is well-developed. She is not ill-appearing, toxic-appearing or diaphoretic.  HENT:     Head: Normocephalic and atraumatic.     Right Ear: External ear normal.     Left Ear: External ear normal.     Nose: Nose normal.     Mouth/Throat:     Mouth: Mucous membranes are moist.  Eyes:     General: No scleral icterus.       Right eye: No discharge.        Left eye: No discharge.  Cardiovascular:     Rate and Rhythm: Normal rate and regular rhythm.     Pulses: Normal pulses.     Heart sounds: Normal heart sounds.  Pulmonary:     Effort: Pulmonary effort is  normal. No respiratory distress.     Breath sounds: Normal breath sounds.  Abdominal:     General: Abdomen is flat. Bowel sounds are normal.     Palpations: Abdomen is soft.     Tenderness: There is abdominal tenderness in the right lower quadrant.  Musculoskeletal:        General: Normal range of motion.     Cervical back: Normal range of motion.     Right lower leg: No edema.     Left lower leg: No edema.  Skin:    General: Skin is warm and dry.     Capillary Refill: Capillary refill takes less than  2 seconds.  Neurological:     Mental Status: She is alert and oriented to person, place, and time.     GCS: GCS eye subscore is 4. GCS verbal subscore is 5. GCS motor subscore is 6.  Psychiatric:        Mood and Affect: Mood normal.        Behavior: Behavior normal. Behavior is cooperative.    ED Results / Procedures / Treatments   Labs (all labs ordered are listed, but only abnormal results are displayed) Labs Reviewed  COMPREHENSIVE METABOLIC PANEL - Abnormal; Notable for the following components:      Result Value   Sodium 134 (*)    Glucose, Bld 109 (*)    Calcium 8.6 (*)    All other components within normal limits  URINALYSIS, ROUTINE W REFLEX MICROSCOPIC - Abnormal; Notable for the following components:   Specific Gravity, Urine >1.046 (*)    Ketones, ur 15 (*)    Protein, ur TRACE (*)    Leukocytes,Ua TRACE (*)    Bacteria, UA RARE (*)    All other components within normal limits  CBC WITH DIFFERENTIAL/PLATELET - Abnormal; Notable for the following components:   WBC 12.3 (*)    Neutro Abs 9.2 (*)    All other components within normal limits  RESP PANEL BY RT-PCR (FLU A&B, COVID) ARPGX2  LIPASE, BLOOD  TROPONIN I (HIGH SENSITIVITY)  TROPONIN I (HIGH SENSITIVITY)    EKG EKG Interpretation  Date/Time:  Monday May 10 2021 08:14:40 EST Ventricular Rate:  59 PR Interval:  198 QRS Duration: 85 QT Interval:  394 QTC Calculation: 391 R Axis:   82 Text  Interpretation: Sinus rhythm Borderline right axis deviation Low voltage, precordial leads similar to prior tracing Confirmed by Tanda Rockers ([id number]) on 05/10/2021 8:45:07 AM  Radiology CT ABDOMEN PELVIS W CONTRAST  Result Date: 05/10/2021 CLINICAL DATA:  Abdominal pain EXAM: CT ABDOMEN AND PELVIS WITH CONTRAST TECHNIQUE: Multidetector CT imaging of the abdomen and pelvis was performed using the standard protocol following bolus administration of intravenous contrast. RADIATION DOSE REDUCTION: This exam was performed according to the departmental dose-optimization program which includes automated exposure control, adjustment of the mA and/or kV according to patient size and/or use of iterative reconstruction technique. CONTRAST:  63mL OMNIPAQUE IOHEXOL 300 MG/ML  SOLN COMPARISON:  None. FINDINGS: Lower chest: Unremarkable. Hepatobiliary: There is fatty infiltration in the liver. Gallbladder is unremarkable. There is no dilation of bile ducts. Pancreas: There is slight prominence of pancreatic duct. No focal abnormality is seen. Spleen: Unremarkable. Adrenals/Urinary Tract: Left adrenal is slightly more prominent in size without discrete nodules. There is no hydronephrosis. There are no renal or ureteral stones. Urinary bladder is not distended. There are few low-density lesions in the right kidney each measuring less than 8 mm suggesting possible renal cysts. Stomach/Bowel: Small hiatal hernia is seen. Small bowel loops are not dilated. Appendix is not dilated. There is no significant wall thickening in the colon. Multiple diverticula are seen in the colon without signs of focal acute diverticulitis. Vascular/Lymphatic: Marked atherosclerotic changes are noted in the aorta and its major branches. There is 3 cm aneurysm in the infrarenal aorta. There is ectasia of infrarenal aorta closer to bifurcation measuring 2.5 cm. There is stranding in the fat planes in the mesentery without any loculated fluid collections.  There are subcentimeter mesenteric lymph nodes. Reproductive: There is mild ectasia of vessels in the left adnexal region. Left gonadal vein is patent. Other: There is  no ascites or pneumoperitoneum. Umbilical hernia containing fat is seen. Small bilateral inguinal hernias containing fat are noted. Musculoskeletal: There is mild anterolisthesis at L4-L5 level. Degenerative changes are noted with spinal stenosis and encroachment of neural foramina at multiple levels, more so at L4-L5 level. IMPRESSION: There is no evidence of intestinal obstruction or pneumoperitoneum. There is no hydronephrosis. Appendix is not dilated. There is stranding in the fat planes in the mesentery which may suggest chronic nonspecific inflammation. There are subcentimeter mesenteric lymph nodes. Marked atherosclerotic changes are noted in the aorta. There is 3 cm aneurysm in the infrarenal aorta. Small hiatal hernia. Diverticulosis of colon. Fatty liver. Lumbar spondylosis with spinal stenosis and encroachment of neural foramina, particularly at L4-L5 level. Other chronic findings as described in the body of the report. Electronically Signed   By: Elmer Picker M.D.   On: 05/10/2021 11:11   DG Chest Portable 1 View  Result Date: 05/10/2021 CLINICAL DATA:  Pain. EXAM: PORTABLE CHEST 1 VIEW COMPARISON:  June 24, 2018. FINDINGS: The heart size and mediastinal contours are within normal limits. Both lungs are clear. The visualized skeletal structures are unremarkable. IMPRESSION: No active disease. Electronically Signed   By: Marijo Conception M.D.   On: 05/10/2021 08:06    Procedures Procedures    Medications Ordered in ED Medications  sodium chloride 0.9 % bolus 1,000 mL (0 mLs Intravenous Stopped 05/10/21 0959)  famotidine (PEPCID) IVPB 20 mg premix (0 mg Intravenous Stopped 05/10/21 0920)  alum & mag hydroxide-simeth (MAALOX/MYLANTA) 200-200-20 MG/5ML suspension 30 mL (30 mLs Oral Given 05/10/21 0749)    And  lidocaine  (XYLOCAINE) 2 % viscous mouth solution 15 mL (15 mLs Oral Given 05/10/21 0750)  ondansetron (ZOFRAN) injection 4 mg (4 mg Intravenous Given 05/10/21 0825)  morphine 4 MG/ML injection 4 mg (4 mg Intravenous Given 05/10/21 0827)  iohexol (OMNIPAQUE) 300 MG/ML solution 85 mL (85 mLs Intravenous Contrast Given 05/10/21 0839)    ED Course/ Medical Decision Making/ A&P                           Medical Decision Making Amount and/or Complexity of Data Reviewed Labs: ordered. Radiology: ordered.  Risk OTC drugs. Prescription drug management.    CC: Abdominal pain, nausea  This patient complains of the above; this involves an extensive number of treatment options and is a complaint that carries with it a high risk of complications and morbidity. Vital signs were reviewed. Serious etiologies considered.  Record review:   Previous records obtained and reviewed   Additional history obtained from family at bedside  Work up as above, notable for:  Labs & imaging results that were available during my care of the patient were reviewed by me and considered in my medical decision making.   I ordered imaging studies which included chest x-ray, CT AP and I independently visualized and interpreted imaging which showed  chronic non-specific inflammation. Atherosclerosis to the aorta w/ infrarenal aneurysm. Hiatal hernia.   Cardiac monitoring reviewed and interpreted personally which shows normal sinus rhythm  Social determinants of health include - n/a  Management: Analgesics, IVF  Reassessment:  Pt reports that she is feeling considerably better, she is in no distress at this time. No N/v. She is tolerating oral intake, gait is intact.   The patient's overall condition has improved, the patient presents with abdominal pain without signs of peritonitis, or other life-threatening serious etiology. The patient understands that at this  time there is no evidence for a more malignant underlying  process, but the patient also understands that early in the process of an illness, an emergency department workup can be falsely reassuring. Detailed discussions were had with the patient regarding current findings, and need for close f/u with PCP or on call doctor. The patient appears stable for discharge and has been instructed to return immediately if the symptoms worsen in any way for re-evaluation. Patient verbalized understanding and is in agreement with current care plan.  All questions answered prior to discharge.        This chart was dictated using voice recognition software.  Despite best efforts to proofread,  errors can occur which can change the documentation meaning.         Final Clinical Impression(s) / ED Diagnoses Final diagnoses:  Nonspecific abdominal pain  Aortic atherosclerosis ([place])  Infrarenal abdominal aortic aneurysm (AAA) without rupture  Hiatal hernia    Rx / DC Orders ED Discharge Orders          Ordered    sucralfate (CARAFATE) 1 g tablet  3 times daily with meals & bedtime        05/10/21 1409    famotidine (PEPCID) 20 MG tablet  Daily        05/10/21 1409    dicyclomine (BENTYL) 20 MG tablet  2 times daily        05/10/21 1410    ondansetron (ZOFRAN) 4 MG tablet  Every 6 hours PRN        05/10/21 1410              Jeanell Sparrow, DO 05/11/21 [phone]

## 2021-05-10 NOTE — ED Triage Notes (Signed)
C/o right lower abd pain that started Friday. Describes as constant that radiates into her back. Denies fevers. C/o nausea denies vomiting. Denies any hx of kidney stones. Denies any urinary symptoms.

## 2021-05-10 NOTE — ED Notes (Signed)
Via pov from home with rlq pain since Friday; states it radiates to back. Describes pain as aching with some sharp pain. Pt endorses anorexia and constant nausea, no emesis or diarrhea. Pt alert & oriented, nad noted.

## 2021-05-11 ENCOUNTER — Other Ambulatory Visit: Payer: Self-pay

## 2021-05-11 ENCOUNTER — Ambulatory Visit: Payer: Medicare PPO

## 2021-05-11 NOTE — Progress Notes (Deleted)
° ° °  SUBJECTIVE:   CHIEF COMPLAINT / HPI:   Follow-up-abdominal pain Follow-up from emergency department visit for abdominal pain which happened yesterday.  Patient had endorsed right lower quadrant pain and anorexia and nausea without emesis.  Lab work was performed which showed mildly elevated white blood cell count of 12.3, urinalysis with trace leukocytes, trace protein, rare bacteria.  CT abdomen pelvis with no hydronephrosis, no evidence of intestinal obstruction or pneumoperitoneum, appendix not dilated, stranding and fat planes in the mesentery suggesting chronic nonspecific inflammation and diverticulosis of the colon.  PERTINENT  PMH / PSH: ***  OBJECTIVE:   There were no vitals taken for this visit. ***  General: NAD, pleasant, able to participate in exam Cardiac: RRR, no murmurs. Respiratory: CTAB, normal effort, No wheezes, rales or rhonchi Abdomen: Bowel sounds present, nontender, nondistended, no hepatosplenomegaly. Extremities: no edema or cyanosis. Skin: warm and dry, no rashes noted Neuro: alert, no obvious focal deficits Psych: Normal affect and mood  ASSESSMENT/PLAN:   No problem-specific Assessment & Plan notes found for this encounter.     Jackelyn Poling, DO [facility]    {    This will disappear when note is signed, click to select method of visit    :1}

## 2021-05-13 ENCOUNTER — Other Ambulatory Visit: Payer: Self-pay

## 2021-05-13 ENCOUNTER — Encounter: Payer: Self-pay | Admitting: Pharmacist

## 2021-05-13 ENCOUNTER — Ambulatory Visit: Payer: Medicare PPO | Admitting: Pharmacist

## 2021-05-13 DIAGNOSIS — J449 Chronic obstructive pulmonary disease, unspecified: Secondary | ICD-10-CM

## 2021-05-13 MED ORDER — BREZTRI AEROSPHERE 160-9-4.8 MCG/ACT IN AERO: 2.0000 | INHALATION_SPRAY | Freq: Two times a day (BID) | RESPIRATORY_TRACT | 11 refills | Status: DC

## 2021-05-13 NOTE — Assessment & Plan Note (Signed)
Patient has been experiencing Shortness of Breath and increased sputum production for the past 7 years and taking albuterol PRN. Spirometry evaluation reveals severe obstruction, post nebulized albuterol tx revealed reversibility, indicative of COPD/Asthma overlap. Spirometry [facility] based on CAT score and exacerbations in the last year. Due to COPD with reversibility - Asthma/Overlap with start triple therapy.  Patient with good inhaler technique. Patient medication adherence reported as excellant.  - Began Breztri 160-9-4.40mcg, 2 puffs BID. -Educated patient on purpose, proper use, potential adverse effects including risk of esophageal candidiasis and need to rinse mouth after each use.   -Reviewed results of pulmonary function tests.  Pt verbalized understanding of results and education.

## 2021-05-13 NOTE — Progress Notes (Signed)
° °  S:    Patient arrives in good spirits and ambulating independently.    Presents for lung function evaluation.    Patient was referred and last seen by PCP, Dr. McDiarmid, on 04/27/2021.   Patient reports breathing has been suboptimal. She reports coughing 1-2 times per week and getting out of breath after walking up one flight of stairs. This has been an ongoing problem since 2015/2016. When she gets short of breath , she uses her albuterol inhaler - but only uses for coughing.   Range of albuterol use from zero to a few days per week. During fall allergy season, she tends to use it a bit more. She has no prior history of asthma or COPD. Able to complete all iADLs. Patient has numerous house cats.   She is a former smoker. Preferred brand was [employer]. She started when she was 16years and quit when she was 81years old. The most she smoked was a half a pack a day.  Pack Year History of Smoking > 20 pack years  Medication adherence reported as good. Current COPD medications: Albuterol Patient exacerbation hx: none  O: Physical Exam Constitutional:      Appearance: Normal appearance.  Neurological:     Mental Status: She is alert.  Psychiatric:        Mood and Affect: Mood normal.        Behavior: Behavior normal.        Thought Content: Thought content normal.    Review of Systems  Respiratory:  Positive for cough, sputum production and shortness of breath.   All other systems reviewed and are negative.  Vitals:   05/13/21 0909  Pulse: 70  SpO2: 96%    mMRC score= 2 CAT score= 17 See Documentation Flowsheet - CAT/COPD for complete symptom scoring.  See "scanned report" or Documentation Flowsheet (discrete results - PFTs) for Spirometry results. Patient provided good effort while attempting spirometry.   Lung Age = 89 Albuterol Neb  Lot# [id number]     Exp. 07/16/2022  A/P: Patient has been experiencing Shortness of Breath and increased sputum production for the past 7  years and taking albuterol PRN. Spirometry evaluation reveals severe obstruction, post nebulized albuterol tx revealed reversibility, indicative of COPD/Asthma overlap. Spirometry [facility] B based on CAT score and exacerbations in the last year. Due to COPD with reversibility - Asthma/Overlap with start triple therapy.  Patient with good inhaler technique. Patient medication adherence reported as excellant.  - Began Breztri 160-9-4.6mcg, 2 puffs BID. -Educated patient on purpose, proper use, potential adverse effects including risk of esophageal candidiasis and need to rinse mouth after each use.   -Reviewed results of pulmonary function tests.  Pt verbalized understanding of results and education.    Written pt instructions provided.  F/U Clinic visit 6wks with PCP.    Total time in face to face counseling 50 minutes.  Patient seen with Penny Pia, PharmD Candidate, and Pervis Hocking, PharmD - PGY2 Pharmacy Resident.  Marland Kitchen

## 2021-05-13 NOTE — Patient Instructions (Addendum)
Ms. Gossman,  It was a pleasure to see you today. 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   Today, we are going to start the inhaler Breztri - 2 puffs twice daily. Make sure you rinse your mouth out and spit the water out after each use.   Schedule a follow up visit in 6 weeks with Dr. Wendy Poet.

## 2021-05-13 NOTE — Progress Notes (Signed)
Reviewed: I agree with Dr. Koval's documentation and management. 

## 2021-05-26 ENCOUNTER — Encounter: Payer: Self-pay | Admitting: Family Medicine

## 2021-06-24 ENCOUNTER — Ambulatory Visit: Payer: Medicare PPO | Admitting: Family Medicine

## 2021-07-01 ENCOUNTER — Other Ambulatory Visit: Payer: Self-pay

## 2021-07-01 ENCOUNTER — Ambulatory Visit: Payer: Medicare PPO | Admitting: Family Medicine

## 2021-07-01 ENCOUNTER — Encounter: Payer: Self-pay | Admitting: Family Medicine

## 2021-07-01 DIAGNOSIS — J449 Chronic obstructive pulmonary disease, unspecified: Secondary | ICD-10-CM | POA: Diagnosis not present

## 2021-07-01 NOTE — Patient Instructions (Signed)
It sounds like you are doing well on the Trelegy.  Please continue to take it. ? ?Your blood pressure is lower today.  It may be that with the decreased work of breathing from the Trelegy therapy your level of adrenaline is lower which could lower your blood pressure.  If the lower blood pressure continues, it may be a good idea to tr a trial of lower dose of Amlodipine.  I would like to see you back in two months to lok into this question.   ?

## 2021-07-02 ENCOUNTER — Encounter: Payer: Self-pay | Admitting: Family Medicine

## 2021-07-02 NOTE — Assessment & Plan Note (Signed)
Established problem that has improved.  Continue current medication regiment.  

## 2021-07-02 NOTE — Progress Notes (Signed)
Nimisha Rathel Zeoli is alone ?Sources of clinical information for visit is/are patient. ?Nursing assessment for this office visit was reviewed with the patient for accuracy and revision.  ? ? ? ?Previous Report(s) Reviewed: PFTs and Dr Macky Lower note.   ?Depression screen [facility] 2/9 07/01/2021  ?Decreased Interest 0  ?Down, Depressed, Hopeless 0  ?PHQ - 2 Score 0  ?Altered sleeping 0  ?Tired, decreased energy 0  ?Change in appetite 0  ?Feeling bad or failure about yourself  0  ?Trouble concentrating 0  ?Moving slowly or fidgety/restless 0  ?Suicidal thoughts 0  ?PHQ-9 Score 0  ?Difficult doing work/chores -  ? ?Flowsheet Row Office Visit from 07/01/2021 in [facility] Office Visit from 04/27/2021 in [facility] Office Visit from 03/18/2021 in [facility]  ?Thoughts that you would be better off dead, or of hurting yourself in some way Not at all Not at all Not at all  ?PHQ-9 Total Score 0 0 8  ? ?  ?  ?Fall Risk  07/01/2021 03/18/2021  ?Falls in the past year? 0 1  ?Number falls in past yr: 0 0  ?Injury with Fall? 0 0  ? ? ?PHQ9 SCORE ONLY 07/01/2021 04/27/2021 03/18/2021  ?PHQ-9 Total Score 0 0 8  ? ? ?Adult vaccines due  ?Topic Date Due  ? TETANUS/TDAP  05/12/2026  ? ? ?Health Maintenance Due  ?Topic Date Due  ? Hepatitis C Screening  Never done  ? Zoster Vaccines- Shingrix (1 of 2) Never done  ?  ? ? ?History/P.E. limitations: none ? ?Adult vaccines due  ?Topic Date Due  ? TETANUS/TDAP  05/12/2026  ? There are no preventive care reminders to display for this patient.  ?Health Maintenance Due  ?Topic Date Due  ? Hepatitis C Screening  Never done  ? Zoster Vaccines- Shingrix (1 of 2) Never done  ?  ? ?Chief Complaint  ?Patient presents with  ? Follow-up  ?  ? ?

## 2021-07-12 DIAGNOSIS — L820 Inflamed seborrheic keratosis: Secondary | ICD-10-CM | POA: Diagnosis not present

## 2021-07-12 DIAGNOSIS — D1801 Hemangioma of skin and subcutaneous tissue: Secondary | ICD-10-CM | POA: Diagnosis not present

## 2021-07-12 DIAGNOSIS — L723 Sebaceous cyst: Secondary | ICD-10-CM | POA: Diagnosis not present

## 2021-07-12 DIAGNOSIS — D485 Neoplasm of uncertain behavior of skin: Secondary | ICD-10-CM | POA: Diagnosis not present

## 2021-07-12 DIAGNOSIS — L821 Other seborrheic keratosis: Secondary | ICD-10-CM | POA: Diagnosis not present

## 2021-07-12 DIAGNOSIS — Z85828 Personal history of other malignant neoplasm of skin: Secondary | ICD-10-CM | POA: Diagnosis not present

## 2021-07-28 DIAGNOSIS — H43811 Vitreous degeneration, right eye: Secondary | ICD-10-CM | POA: Diagnosis not present

## 2021-07-28 DIAGNOSIS — H35371 Puckering of macula, right eye: Secondary | ICD-10-CM | POA: Diagnosis not present

## 2021-07-28 DIAGNOSIS — Z961 Presence of intraocular lens: Secondary | ICD-10-CM | POA: Diagnosis not present

## 2021-08-25 ENCOUNTER — Telehealth: Payer: Self-pay

## 2021-08-25 NOTE — Telephone Encounter (Signed)
Patient calls nurse line reporting possible adverse reactions to [employer]. ? ?Patient reports since starting this medication in January she has noticed muscle aches and "shifting teeth."  ? ?Patient reports her knees hurt so bad she can barely walk sometimes.  ? ?Patient read the medication can cause these reactions in some.  ? ?Patient has an already scheduled apt with PCP on 5/18. Patient plans to discuss then.  ?

## 2021-08-30 NOTE — Telephone Encounter (Signed)
Please contact Ms Heather Andrews has stopped the [place].

## 2021-09-02 ENCOUNTER — Encounter: Payer: Self-pay | Admitting: Family Medicine

## 2021-09-02 ENCOUNTER — Ambulatory Visit: Payer: Medicare PPO | Admitting: Family Medicine

## 2021-09-02 VITALS — BP 136/56 | HR 76 | Ht 60.0 in | Wt 168.2 lb

## 2021-09-02 DIAGNOSIS — L720 Epidermal cyst: Secondary | ICD-10-CM

## 2021-09-02 DIAGNOSIS — J449 Chronic obstructive pulmonary disease, unspecified: Secondary | ICD-10-CM

## 2021-09-02 MED ORDER — STIOLTO RESPIMAT 2.5-2.5 MCG/ACT IN AERS: 2.0000 | INHALATION_SPRAY | Freq: Every day | RESPIRATORY_TRACT | 5 refills | Status: DC

## 2021-09-02 MED ORDER — MUPIROCIN 2 % EX OINT: 1.0000 "application " | TOPICAL_OINTMENT | Freq: Two times a day (BID) | CUTANEOUS | 0 refills | Status: AC

## 2021-09-02 NOTE — Assessment & Plan Note (Signed)
Established problem Heather Andrews is concerned that Judithann Sauger may be causing a shift in position of her left upper central and lateral incisors , and right knee pain.  She read the package insert and concerned that the inhaled glucocorticoid med may be invovled in these findings.   Plan Stop [employer] Stiolto 2 puffs inhaled daily RTC 4 weeks to assess COPD symptom control

## 2021-09-02 NOTE — Patient Instructions (Addendum)
pLEASE STOP THE bREZTRI. pLEASE START sTIOLTO INHALER TWO INHALATIONS ONCE A DAY.  tHIS MEDICATION DOES NOT CONTAIN STEROIDS.    Please apply the mupirocin ointment (antibiotic) to the wound on your back twice a day until redness and drainage have stopped.   I believe the wound is from an epidermoid cysts.  They can be excised if they are troublesome, e.g., reinfection, drainage, increasing size.     Epidermoid Cyst  An epidermoid cyst, also known as epidermal cyst, is a sac made of skin tissue. The sac contains a substance called keratin. Keratin is a protein that is normally secreted through the hair follicles. When keratin becomes trapped in the top layer of skin (epidermis), it can form an epidermoid cyst. Epidermoid cysts can be found anywhere on your body. These cysts are usually harmless (benign), and they may not cause symptoms unless they become inflamed or infected. What are the causes? This condition may be caused by: A blocked hair follicle. A hair that curls and re-enters the skin instead of growing straight out of the skin (ingrown hair). A blocked pore. Irritated skin. An injury to the skin. Certain conditions that are passed along from parent to child (inherited). Human papillomavirus (HPV). This happens rarely when cysts occur on the bottom of the feet. Long-term (chronic) sun damage to the skin. What increases the risk? The following factors may make you more likely to develop an epidermoid cyst: Having acne. Being female. Having an injury to the skin. Being past puberty. Having certain rare genetic disorders. What are the signs or symptoms? The only symptom of this condition may be a small, painless lump underneath the skin. When an epidermal cyst ruptures, it may become inflamed. True infection in cysts is rare. Symptoms may include: Redness. Inflammation. Tenderness. Warmth. Keratin draining from the cyst. Keratin is grayish-white, bad-smelling substance. Pus  draining from the cyst. How is this diagnosed? This condition is diagnosed with a physical exam. In some cases, you may have a sample of tissue (biopsy) taken from your cyst to be examined under a microscope or tested for bacteria. You may be referred to a health care provider who specializes in skin care (dermatologist). How is this treated? If a cyst becomes inflamed, treatment may include: Opening and draining the cyst, done by a health care provider. After draining, minor surgery to remove the rest of the cyst may be done. Taking antibiotic medicine. Having injections of medicines (steroids) that help to reduce inflammation. Having surgery to remove the cyst. Surgery may be done if the cyst: Becomes large. Bothers you. Has a chance of turning into cancer. Do not try to open a cyst yourself. Follow these instructions at home: Medicines If you were prescribed an antibiotic medicine, take it it as told by your health care provider. Do not stop using the antibiotic even if you start to feel better. Take over-the-counter and prescription medicines only as told by your health care provider. General instructions Keep the area around your cyst clean and dry. Wear loose, dry clothing. Avoid touching your cyst. Check your cyst every day for signs of infection. Check for: Redness, swelling, or pain. Fluid or blood. Warmth. Pus or a bad smell. Keep all follow-up visits. This is important. How is this prevented? Wear clean, dry, clothing. Avoid wearing tight clothing. Keep your skin clean and dry. Take showers or baths every day. Contact a health care provider if: Your cyst develops symptoms of infection. Your condition is not improving or is getting  worse. You develop a cyst that looks different from other cysts you have had. You have a fever. Get help right away if: Redness spreads from the cyst into the surrounding area. Summary An epidermoid cyst is a sac made of skin tissue.  These cysts are usually harmless (benign), and they may not cause symptoms unless they become inflamed. If a cyst becomes inflamed, treatment may include surgery to open and drain the cyst, or to remove it. Treatment may also include medicines by mouth or through an injection. Take over-the-counter and prescription medicines only as told by your health care provider. If you were prescribed an antibiotic medicine, take it as told by your health care provider. Do not stop using the antibiotic even if you start to feel better. Contact a health care provider if your condition is not improving or is getting worse. Keep all follow-up visits as told by your health care provider. This is important. This information is not intended to replace advice given to you by your health care provider. Make sure you discuss any questions you have with your health care provider. Document Revised: 07/10/2019 Document Reviewed: 07/10/2019 Elsevier Patient Education  [place].

## 2021-09-02 NOTE — Progress Notes (Signed)
Heather Andrews is alone Sources of clinical information for visit is/are patient. Nursing assessment for this office visit was reviewed with the patient for accuracy and revision.     Previous Report(s) Reviewed: PFT results and Pharmacy's consult note     09/02/2021    9:01 AM  Depression screen PHQ 2/9  Decreased Interest 0  Down, Depressed, Hopeless 0  PHQ - 2 Score 0  Altered sleeping 0  Tired, decreased energy 1  Change in appetite 0  Feeling bad or failure about yourself  0  Trouble concentrating 0  Moving slowly or fidgety/restless 0  PHQ-9 Score 1  Difficult doing work/chores Somewhat difficult   Flowsheet Row Office Visit from 09/02/2021 in [facility] Office Visit from 07/01/2021 in [facility] Office Visit from 04/27/2021 in [place] [facility]  Thoughts that you would be better off dead, or of hurting yourself in some way -- Not at all Not at all  PHQ-9 Total Score 1 0 0          09/02/2021    9:01 AM 07/01/2021    8:52 AM 03/18/2021    1:43 PM  Fall Risk   Falls in the past year? 0 0 1  Number falls in past yr: 0 0 0  Injury with Fall? 0 0 0       09/02/2021    9:01 AM 07/01/2021    8:52 AM 04/27/2021    9:00 AM  PHQ9 SCORE ONLY  PHQ-9 Total Score 1 0 0    Adult vaccines due  Topic Date Due   TETANUS/TDAP  05/12/2026    Health Maintenance Due  Topic Date Due   Zoster Vaccines- Shingrix (1 of 2) Never done      History/P.E. limitations: none  Adult vaccines due  Topic Date Due   TETANUS/TDAP  05/12/2026   There are no preventive care reminders to display for this patient.  Health Maintenance Due  Topic Date Due   Zoster Vaccines- Shingrix (1 of 2) Never done     Chief Complaint  Patient presents with   Follow-up

## 2021-09-17 DIAGNOSIS — R5383 Other fatigue: Secondary | ICD-10-CM | POA: Diagnosis not present

## 2021-09-17 DIAGNOSIS — E559 Vitamin D deficiency, unspecified: Secondary | ICD-10-CM | POA: Diagnosis not present

## 2021-09-17 DIAGNOSIS — M810 Age-related osteoporosis without current pathological fracture: Secondary | ICD-10-CM | POA: Diagnosis not present

## 2021-09-21 ENCOUNTER — Encounter: Payer: Self-pay | Admitting: *Deleted

## 2021-10-07 ENCOUNTER — Ambulatory Visit: Payer: Medicare PPO | Admitting: Family Medicine

## 2021-10-07 ENCOUNTER — Encounter: Payer: Self-pay | Admitting: Family Medicine

## 2021-10-07 DIAGNOSIS — J449 Chronic obstructive pulmonary disease, unspecified: Secondary | ICD-10-CM

## 2021-10-07 NOTE — Progress Notes (Signed)
Heather Andrews is alone Sources of clinical information for visit is/are patient. Nursing assessment for this office visit was reviewed with the patient for accuracy and revision.     Previous Report(s) Reviewed: none     10/07/2021    8:57 AM  Depression screen PHQ 2/9  Decreased Interest 0  Down, Depressed, Hopeless 0  PHQ - 2 Score 0  Altered sleeping 0  Tired, decreased energy 0  Change in appetite 0  Feeling bad or failure about yourself  0  Trouble concentrating 0  Moving slowly or fidgety/restless 0  Suicidal thoughts 0  PHQ-9 Score 0  Difficult doing work/chores Not difficult at all   [employer] Office Visit from 10/07/2021 in [facility] Office Visit from 09/02/2021 in [facility] Office Visit from 07/01/2021 in [place] [facility]  Thoughts that you would be better off dead, or of hurting yourself in some way Not at all -- Not at all  PHQ-9 Total Score 0 1 0          10/07/2021    8:57 AM 09/02/2021    9:01 AM 07/01/2021    8:52 AM 03/18/2021    1:43 PM  Fall Risk   Falls in the past year? 0 0 0 1  Number falls in past yr: 0 0 0 0  Injury with Fall? 0 0 0 0       10/07/2021    8:57 AM 09/02/2021    9:01 AM 07/01/2021    8:52 AM  PHQ9 SCORE ONLY  PHQ-9 Total Score 0 1 0    Adult vaccines due  Topic Date Due   TETANUS/TDAP  05/12/2026    There are no preventive care reminders to display for this patient.    History/P.E. limitations: none  Adult vaccines due  Topic Date Due   TETANUS/TDAP  05/12/2026   There are no preventive care reminders to display for this patient. There are no preventive care reminders to display for this patient.   Chief Complaint  Patient presents with   Follow-up

## 2021-10-07 NOTE — Patient Instructions (Signed)
It sounds like your new inhaler Stiolto is working well for you.  There were a few crackles down in the area of your lower left lung.  This can be transient thing without a clear cause, it can be from old scar tissue from previous lung infections, and it can be from silent aspiration into your wind pipe.   If you notice more coughing after swallowing or more frequent choking on foods, please let Dr Dema Timmons know.

## 2021-10-07 NOTE — Assessment & Plan Note (Signed)
Established problem Well Controlled No signs of complications, medication side effects, or red flags. Continue current medications of Stiolto RTC 6 months

## 2021-10-13 DIAGNOSIS — M810 Age-related osteoporosis without current pathological fracture: Secondary | ICD-10-CM | POA: Diagnosis not present

## 2021-10-13 DIAGNOSIS — E559 Vitamin D deficiency, unspecified: Secondary | ICD-10-CM | POA: Diagnosis not present

## 2021-12-04 ENCOUNTER — Other Ambulatory Visit: Payer: Self-pay | Admitting: Family Medicine

## 2022-01-03 ENCOUNTER — Other Ambulatory Visit: Payer: Self-pay | Admitting: Family Medicine

## 2022-01-14 ENCOUNTER — Telehealth: Payer: Self-pay

## 2022-01-14 NOTE — Telephone Encounter (Signed)
Patient calls nurse line reporting a rash on right LE.   Patient reports this area is very itchy and wraps around her calf. Patient reports she started noticing it ~4 weeks ago. Patient reports its scaly in nature.   Patient denies any signs of infection. No fevers or chills, discharge or odor.   Patient scheduled for 10/19 with PCP.   Red flags discussed with patient for sooner evaluation.

## 2022-01-21 IMAGING — DX DG SHOULDER 2+V*R*
3 series · 3 of 3 positions shown · non-contrast
Comparison: Chest radiographs 06/24/2018.

CLINICAL DATA: 79-year-old female status post trip and fall at
home. Pain.

EXAM:
RIGHT HUMERUS - 2+ VIEW;
RIGHT SHOULDER - 2+ VIEW

[shoulder obl]
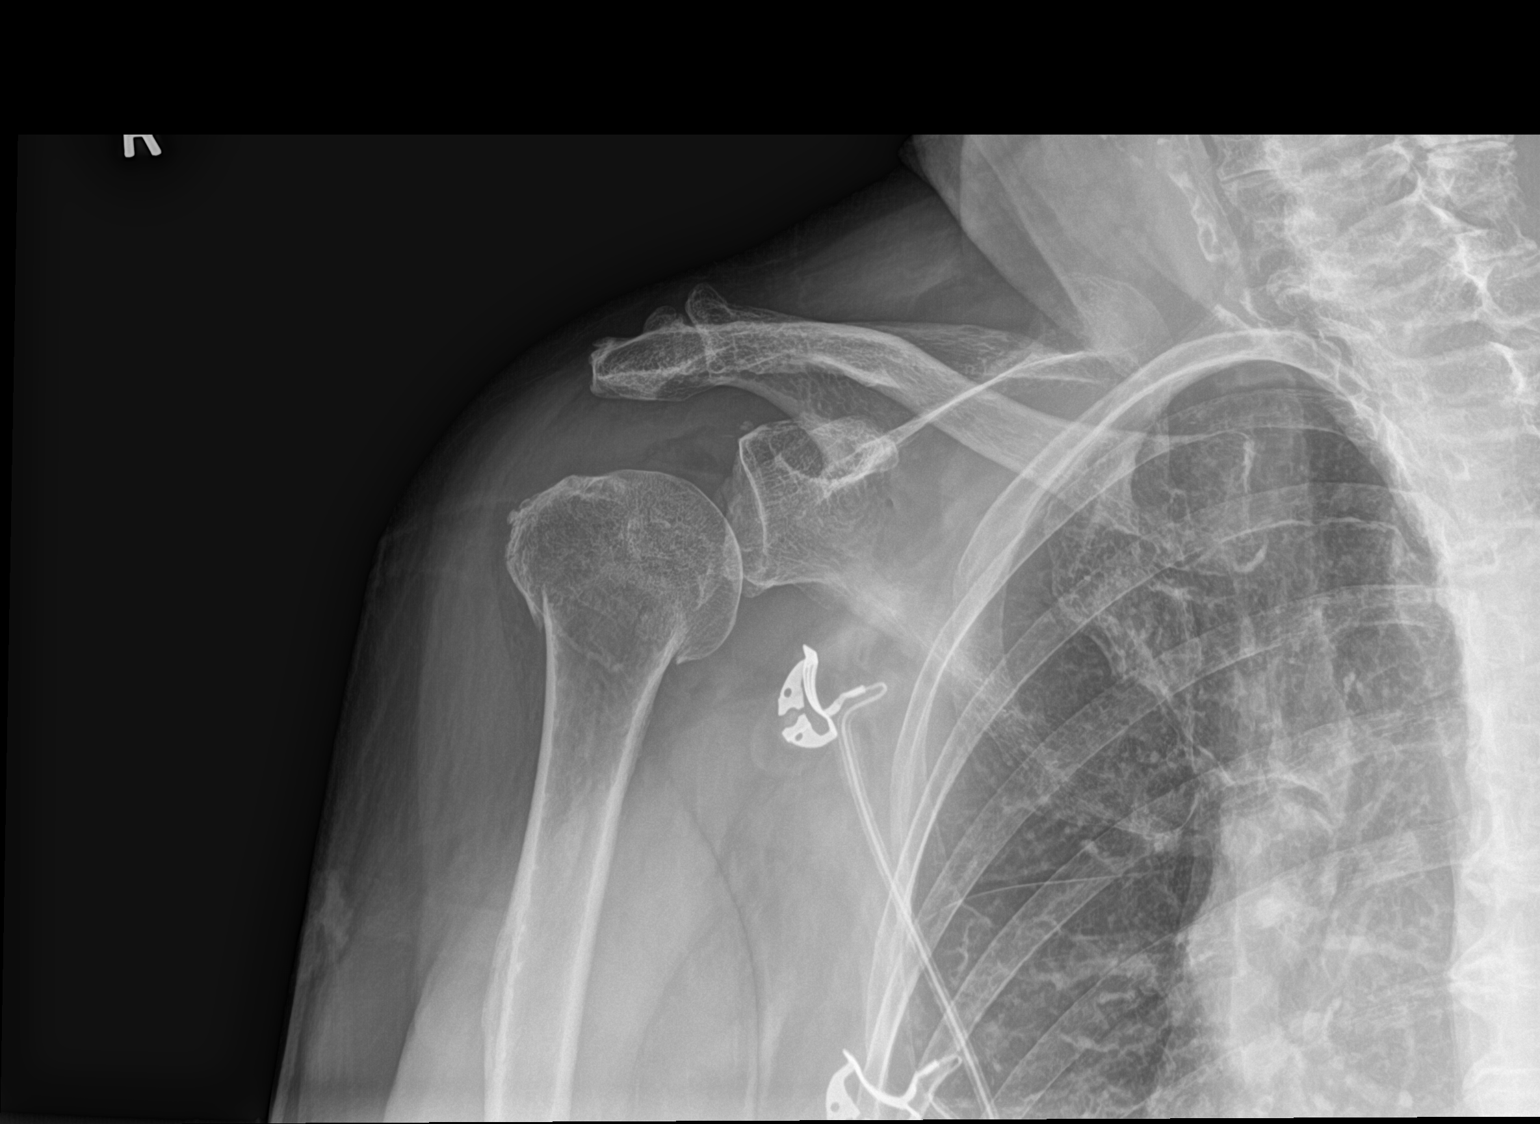

[shoulder swimmer]
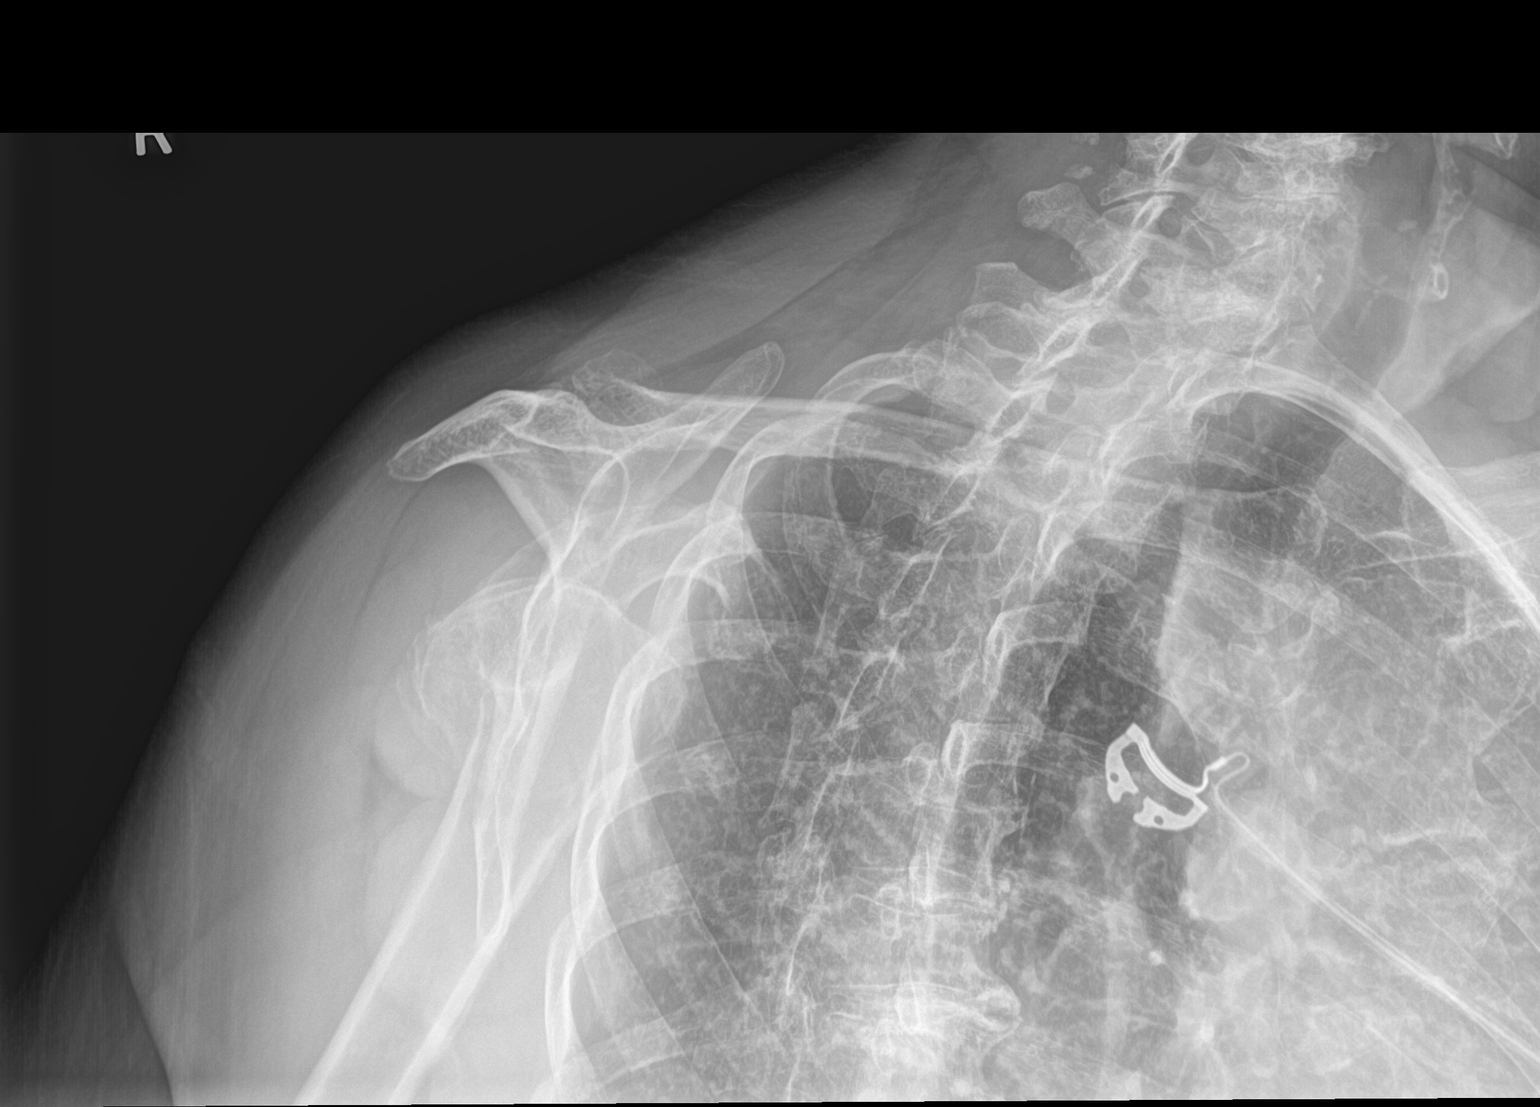

[shoulder ap]
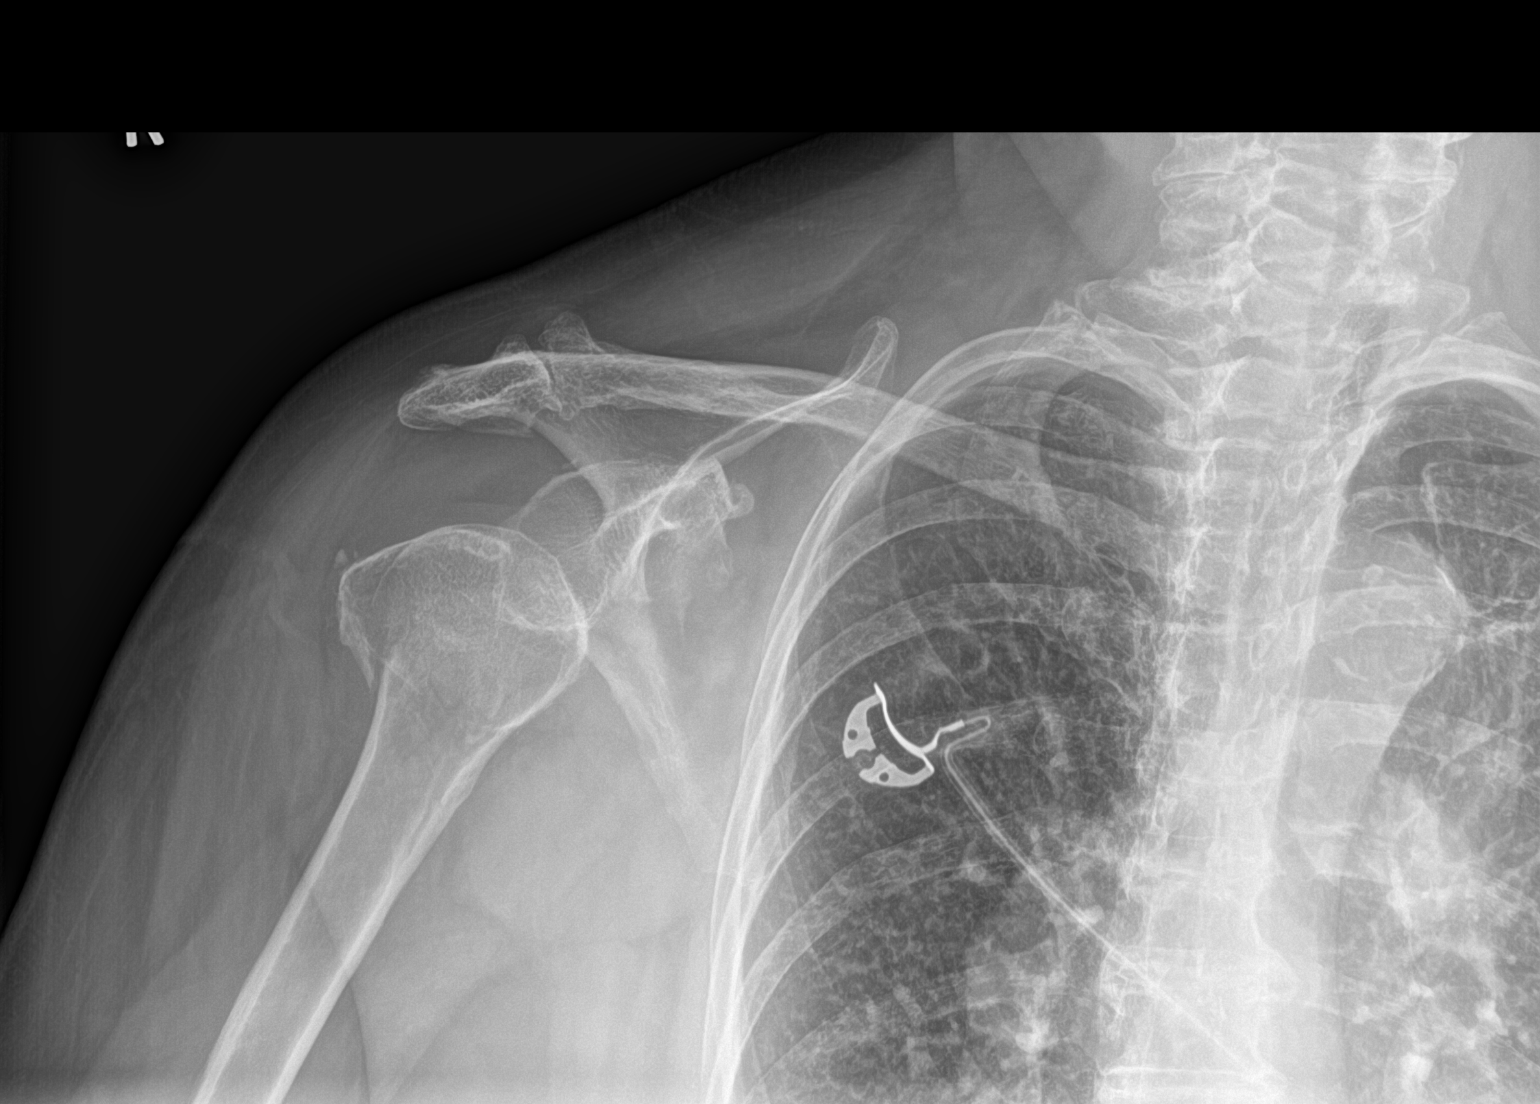

[3 of 3 positions shown; findings below may reference images not displayed]

FINDINGS: RIGHT  SHOULDER SERIES:

Comminuted and mildly impacted fracture of the proximal right
humerus. Fracture lines involving the lower humeral head and neck.
No associated glenohumeral dislocation.

Right clavicle and scapula appear intact. Joint space loss and
degenerative spurring at the right AC joint.

Visible right chest and ribs appear stable.

RIGHT HUMERUS:

Proximal right humerus fracture. The more distal right humerus
appears intact with grossly normal alignment at the right elbow.
IMPRESSION: 1. Comminuted and mildly impacted fracture of the right humeral
head/neck.
2. No other acute fracture or dislocation identified.

## 2022-01-27 DIAGNOSIS — N3946 Mixed incontinence: Secondary | ICD-10-CM | POA: Diagnosis not present

## 2022-02-03 ENCOUNTER — Ambulatory Visit: Payer: Medicare PPO | Admitting: Family Medicine

## 2022-02-03 ENCOUNTER — Encounter: Payer: Self-pay | Admitting: Family Medicine

## 2022-02-03 VITALS — BP 140/60 | HR 84 | Temp 98.2°F | Ht 60.0 in | Wt 179.0 lb

## 2022-02-03 DIAGNOSIS — I10 Essential (primary) hypertension: Secondary | ICD-10-CM | POA: Diagnosis not present

## 2022-02-03 DIAGNOSIS — Z23 Encounter for immunization: Secondary | ICD-10-CM

## 2022-02-03 DIAGNOSIS — I872 Venous insufficiency (chronic) (peripheral): Secondary | ICD-10-CM | POA: Diagnosis not present

## 2022-02-03 DIAGNOSIS — R0602 Shortness of breath: Secondary | ICD-10-CM

## 2022-02-03 DIAGNOSIS — R600 Localized edema: Secondary | ICD-10-CM | POA: Insufficient documentation

## 2022-02-03 MED ORDER — TRIAMCINOLONE ACETONIDE 0.1 % EX CREA: 1.0000 | TOPICAL_CREAM | Freq: Two times a day (BID) | CUTANEOUS | 0 refills | Status: DC

## 2022-02-03 NOTE — Patient Instructions (Signed)
I am concerned with the swelling in your right leg.  We must first make sure it is not a blood clot in your leg (Deep Vein Thrombosis) Please go for the leg ultrasound test to rule out a blood clot.  Dr Purva Vessell will contact you once he gets the results of this test.  If there is a blood clot, you will need to start a blood thinner for several months.   Apply triamcinolone cream once a day to the redness of your right leg once a day until redness is gone.  Spend two minutes rubbing the cream into the skin.    We are checking for other causes of leg swelling with blood tests.    Should you develop shortness of breath or chest pain, please go immediately to the Emergency Room for evaluation and treatment.  You could be experiencing a pulmonary embolism which is a very serious problem needing immediate treatment.

## 2022-02-03 NOTE — Assessment & Plan Note (Addendum)
New problem Onset of right lower leg swelling about a month ago. Associated itching, non painful rash of right lower leg No injury to leg No recent immobilization,  No recent travel.  No history of blood clots. No fever/chills No pain with walking.   PE Cor: Regular rate and rhythm, no gallup Lung: BCTA, no tachypnea, no increased WOB Ext: Right leg with shiny, tight edema 3/4 up the right lower leg. Right calf circumference 4 cm greater than left calf.  Rash of erythematous maculopapules scattered circumference distal lower limb. Mild excortiation changes  Increased warmth c.t. left, non-tender to palpation. Does not involve foot.  Left leg with trace ankle edema.  Weight up 11 pounds from May office visit   Assessment and Plan Subacute right leg edema with associated venous stasis dermatitis.   Wells DVT score 2 (right calf circ 4 cm greater than left calf, edema confined to symptomatic leg)  Sent for venous doppler right leg r/o DVT Patient's mobile [phone] Checking organ functions with BNP, Comprehensive Metabolic Panel Start Triamcinolone 0.1% crm daily to leg rash.   Time spent 30 minutes in evaluation, coordinating testing and treatment and explaining concerns and red flag symptoms for which she should seek immediate ED care.

## 2022-02-03 NOTE — Progress Notes (Signed)
Heather Andrews is alone Sources of clinical information for visit is/are patient. Nursing assessment for this office visit was reviewed with the patient for accuracy and revision.     Previous Report(s) Reviewed: none     02/03/2022    3:29 PM  Depression screen PHQ 2/9  Decreased Interest 0  Down, Depressed, Hopeless 0  PHQ - 2 Score 0  Altered sleeping 1  Tired, decreased energy 0  Change in appetite 0  Feeling bad or failure about yourself  0  Trouble concentrating 0  Moving slowly or fidgety/restless 0  Suicidal thoughts 0  PHQ-9 Score 1  Difficult doing work/chores Not difficult at all   [place] Visit from 02/03/2022 in [place] Office Visit from 10/07/2021 in [place] Office Visit from 09/02/2021 in [place]  Thoughts that you would be better off dead, or of hurting yourself in some way Not at all Not at all --  PHQ-9 Total Score 1 0 1          02/03/2022    3:28 PM 10/07/2021    8:57 AM 09/02/2021    9:01 AM 07/01/2021    8:52 AM 03/18/2021    1:43 PM  Fall Risk   Falls in the past year? 0 0 0 0 1  Number falls in past yr: 0 0 0 0 0  Injury with Fall? 0 0 0 0 0       02/03/2022    3:29 PM 10/07/2021    8:57 AM 09/02/2021    9:01 AM  PHQ9 SCORE ONLY  PHQ-9 Total Score 1 0 1    Adult vaccines due  Topic Date Due   TETANUS/TDAP  05/12/2026    Health Maintenance Due  Topic Date Due   COVID-19 Vaccine (5 - Pfizer risk series) 05/13/2021   Zoster Vaccines- Shingrix (2 of 2) 11/12/2021      History/P.E. limitations: none  Adult vaccines due  Topic Date Due   TETANUS/TDAP  05/12/2026   There are no preventive care reminders to display for this patient.  Health Maintenance Due  Topic Date Due   COVID-19 Vaccine (5 - Pfizer risk series) 05/13/2021   Zoster Vaccines- Shingrix (2 of 2) 11/12/2021     Chief Complaint  Patient presents with   Rash    Right leg rash  and swelling      --------------------------------------------------------------------------------------------------------------------------------------------- Visit Problem List with A/P  Edema of right lower leg New problem Onset of right lower leg swelling about a month ago. Associated itching, non painful rash of right lower leg No injury to leg No recent immobilization,  No recent travel.  No history of blood clots. No fever/chills No pain with walking.  No shortness of breath, NO DOE, no chest pains  PE Cor: Regular rate and rhythm, no gallup Lung: BCTA, no tachypnea, no increased WOB Ext: Right leg with shiny, tight edema 3/4 up the right lower leg. Right calf circumference 4 cm greater than left calf.  Rash of erythematous maculopapules scattered circumference distal lower limb. Mild excortiation changes  Increased warmth c.t. left, non-tender to palpation. Does not involve foot.  Left leg with trace ankle edema.  Weight up 11 pounds from May office visit   Assessment and Plan Subacute right leg edema with associated venous stasis dermatitis.   Wells DVT score 2 (right calf circ 4 cm greater than left calf, edema confined to symptomatic leg)  Sent for venous doppler right leg r/o DVT Patient's mobile [phone] Checking organ functions with BNP, Comprehensive Metabolic Panel Start Triamcinolone 0.1% crm daily to leg rash.   Time spent 30 minutes in evaluation, coordinating testing and treatment and explaining concerns and red flag symptoms for which she should seek immediate ED care.

## 2022-02-04 ENCOUNTER — Other Ambulatory Visit: Payer: Self-pay | Admitting: Family Medicine

## 2022-02-04 ENCOUNTER — Ambulatory Visit (HOSPITAL_COMMUNITY)
Admission: RE | Admit: 2022-02-04 | Discharge: 2022-02-04 | Disposition: A | Discharge status: Home / Self Care | Payer: Medicare PPO | Source: Ambulatory Visit | Attending: Family Medicine | Admitting: Family Medicine

## 2022-02-04 ENCOUNTER — Telehealth: Payer: Self-pay | Admitting: Family Medicine

## 2022-02-04 DIAGNOSIS — R599 Enlarged lymph nodes, unspecified: Secondary | ICD-10-CM

## 2022-02-04 DIAGNOSIS — R600 Localized edema: Secondary | ICD-10-CM | POA: Diagnosis not present

## 2022-02-04 NOTE — Progress Notes (Signed)
Right lower extremity venous duplex has been completed. Preliminary results can be found in CV Proc through chart review.  Results were faxed to Dr. McDiarmid.  02/04/22 8:56 AM Heather Andrews RVT

## 2022-02-04 NOTE — Assessment & Plan Note (Signed)
All LN enlargements found incidentally on imaging (mammogram & DVT US) No systemic inflammatory symptoms, no B sympotms.   Does have cats.   ,

## 2022-02-04 NOTE — Telephone Encounter (Signed)
We discussed Heather Andrews's DVT US testt without evidence of DVT. Normal labs so far, BNP pending. Uncertain the reason for the right leg unilateral edema. ? Unilateral venous insufficiency  Will need to keep the LAN in mind should leg not improve.   Recommend Holding Amodipine Increase Hctz 12.5 tab from one to two daily. Compression hose (OTC) Continue topical triamcinolone crm  Requested Heather Andrews to contact me if she has not seen some improvement within a week or two.

## 2022-02-05 LAB — CBC
Hematocrit: 36.6 % (ref 34.0–46.6)
Hemoglobin: 12.4 g/dL (ref 11.1–15.9)
MCH: 30.5 pg (ref 26.6–33.0)
MCHC: 33.9 g/dL (ref 31.5–35.7)
MCV: 90 fL (ref 79–97)
Platelets: 239 10*3/uL (ref 150–450)
RBC: 4.07 x10E6/uL (ref 3.77–5.28)
RDW: 13.9 % (ref 11.7–15.4)
WBC: 10.2 10*3/uL (ref 3.4–10.8)

## 2022-02-05 LAB — CMP14+EGFR
ALT: 11 IU/L (ref 0–32)
AST: 22 IU/L (ref 0–40)
Albumin/Globulin Ratio: 1.5 (ref 1.2–2.2)
Albumin: 4.2 g/dL (ref 3.8–4.8)
Alkaline Phosphatase: 60 IU/L (ref 44–121)
BUN/Creatinine Ratio: 25 (ref 12–28)
BUN: 21 mg/dL (ref 8–27)
Bilirubin Total: 0.2 mg/dL (ref 0.0–1.2)
CO2: 27 mmol/L (ref 20–29)
Calcium: 9.1 mg/dL (ref 8.7–10.3)
Chloride: 99 mmol/L (ref 96–106)
Creatinine, Ser: 0.83 mg/dL (ref 0.57–1.00)
Globulin, Total: 2.8 g/dL (ref 1.5–4.5)
Glucose: 102 mg/dL — ABNORMAL HIGH (ref 70–99)
Potassium: 4.5 mmol/L (ref 3.5–5.2)
Sodium: 141 mmol/L (ref 134–144)
Total Protein: 7 g/dL (ref 6.0–8.5)
eGFR: 71 mL/min/{1.73_m2} (ref >59)

## 2022-02-05 LAB — BRAIN NATRIURETIC PEPTIDE: BNP: 19.5 pg/mL (ref 0.0–100.0)

## 2022-03-03 ENCOUNTER — Other Ambulatory Visit: Payer: Self-pay | Admitting: Family Medicine

## 2022-03-03 DIAGNOSIS — J4489 Other specified chronic obstructive pulmonary disease: Secondary | ICD-10-CM

## 2022-03-21 DIAGNOSIS — M810 Age-related osteoporosis without current pathological fracture: Secondary | ICD-10-CM | POA: Diagnosis not present

## 2022-03-21 DIAGNOSIS — R5383 Other fatigue: Secondary | ICD-10-CM | POA: Diagnosis not present

## 2022-03-21 DIAGNOSIS — E559 Vitamin D deficiency, unspecified: Secondary | ICD-10-CM | POA: Diagnosis not present

## 2022-04-04 DIAGNOSIS — M810 Age-related osteoporosis without current pathological fracture: Secondary | ICD-10-CM | POA: Insufficient documentation

## 2022-04-04 DIAGNOSIS — L570 Actinic keratosis: Secondary | ICD-10-CM | POA: Insufficient documentation

## 2022-04-04 DIAGNOSIS — J069 Acute upper respiratory infection, unspecified: Secondary | ICD-10-CM | POA: Diagnosis not present

## 2022-04-04 DIAGNOSIS — N393 Stress incontinence (female) (male): Secondary | ICD-10-CM | POA: Insufficient documentation

## 2022-04-04 DIAGNOSIS — J441 Chronic obstructive pulmonary disease with (acute) exacerbation: Secondary | ICD-10-CM | POA: Diagnosis not present

## 2022-04-25 ENCOUNTER — Emergency Department (HOSPITAL_BASED_OUTPATIENT_CLINIC_OR_DEPARTMENT_OTHER): Payer: Medicare PPO | Admitting: Radiology

## 2022-04-25 ENCOUNTER — Emergency Department (HOSPITAL_BASED_OUTPATIENT_CLINIC_OR_DEPARTMENT_OTHER)
Admission: EM | Admit: 2022-04-25 | Discharge: 2022-04-26 | Disposition: A | Discharge status: Home / Self Care | Payer: Medicare PPO | Attending: Emergency Medicine | Admitting: Emergency Medicine

## 2022-04-25 ENCOUNTER — Other Ambulatory Visit: Payer: Self-pay

## 2022-04-25 ENCOUNTER — Emergency Department (HOSPITAL_BASED_OUTPATIENT_CLINIC_OR_DEPARTMENT_OTHER): Payer: Medicare PPO

## 2022-04-25 ENCOUNTER — Encounter (HOSPITAL_BASED_OUTPATIENT_CLINIC_OR_DEPARTMENT_OTHER): Payer: Self-pay

## 2022-04-25 DIAGNOSIS — Z79899 Other long term (current) drug therapy: Secondary | ICD-10-CM | POA: Diagnosis not present

## 2022-04-25 DIAGNOSIS — Z961 Presence of intraocular lens: Secondary | ICD-10-CM | POA: Diagnosis not present

## 2022-04-25 DIAGNOSIS — H53032 Strabismic amblyopia, left eye: Secondary | ICD-10-CM | POA: Diagnosis not present

## 2022-04-25 DIAGNOSIS — W01190A Fall on same level from slipping, tripping and stumbling with subsequent striking against furniture, initial encounter: Secondary | ICD-10-CM | POA: Diagnosis not present

## 2022-04-25 DIAGNOSIS — W19XXXA Unspecified fall, initial encounter: Secondary | ICD-10-CM

## 2022-04-25 DIAGNOSIS — I10 Essential (primary) hypertension: Secondary | ICD-10-CM | POA: Insufficient documentation

## 2022-04-25 DIAGNOSIS — S60012A Contusion of left thumb without damage to nail, initial encounter: Secondary | ICD-10-CM

## 2022-04-25 DIAGNOSIS — S0990XA Unspecified injury of head, initial encounter: Secondary | ICD-10-CM

## 2022-04-25 DIAGNOSIS — R519 Headache, unspecified: Secondary | ICD-10-CM | POA: Diagnosis present

## 2022-04-25 DIAGNOSIS — Z043 Encounter for examination and observation following other accident: Secondary | ICD-10-CM | POA: Diagnosis not present

## 2022-04-25 DIAGNOSIS — H43811 Vitreous degeneration, right eye: Secondary | ICD-10-CM | POA: Diagnosis not present

## 2022-04-25 DIAGNOSIS — Z87891 Personal history of nicotine dependence: Secondary | ICD-10-CM | POA: Insufficient documentation

## 2022-04-25 DIAGNOSIS — H35371 Puckering of macula, right eye: Secondary | ICD-10-CM | POA: Diagnosis not present

## 2022-04-25 DIAGNOSIS — M25512 Pain in left shoulder: Secondary | ICD-10-CM | POA: Diagnosis not present

## 2022-04-25 DIAGNOSIS — M79645 Pain in left finger(s): Secondary | ICD-10-CM | POA: Diagnosis not present

## 2022-04-25 DIAGNOSIS — M25511 Pain in right shoulder: Secondary | ICD-10-CM | POA: Diagnosis not present

## 2022-04-25 MED ORDER — ONDANSETRON 4 MG PO TBDP: 4.0000 mg | ORAL_TABLET | Freq: Three times a day (TID) | ORAL | 1 refills | Status: DC | PRN

## 2022-04-25 MED ORDER — ONDANSETRON 4 MG PO TBDP
4.0000 mg | ORAL_TABLET | Freq: Once | ORAL | Status: AC
  Administered 2022-04-25: 4 mg via ORAL
  Filled 2022-04-25: qty 1

## 2022-04-25 NOTE — ED Triage Notes (Signed)
Pt presents from home with daughter following an unwitnessed mechanical fall at 4:00 pm.  Denies LOC.  Reports she hit the right side of her head, does not take blood thinners.  Reports nausea and headache, denies vomiting.  Reports some generalized right side pain

## 2022-04-25 NOTE — Discharge Instructions (Signed)
Head CT and x-ray of the left thumb and the right shoulder without any significant abnormalities.  Take the Zofran as needed for the nausea.  Symptoms with the nausea and headache could be due to a mild concussion.  If symptoms persist follow-up with your doctors.  Expect to be sore and stiff the next few days.  Recommend extra strength Tylenol.

## 2022-04-25 NOTE — ED Provider Notes (Addendum)
[facility] EMERGENCY DEPT Provider Note   CSN: [id number] Arrival date & time: 04/25/22  5784     History  Chief Complaint  Patient presents with   Heather Andrews is a 82 y.o. female.  Patient with a fall at home at around 1600 today.  Stumbled and then ended up falling into a coffee table.  Hit the right side of her head.  Not on blood thinners.  Since that is had nausea headache but the headache is improving nausea is improved significantly.  Patient also with pain to the right shoulder and has bruising to her left thumb.  Patient has been able to get up and ambulate since the fall.  Very reassuring.  No neck pain no back pain.  No lower extremity pain.  Past medical history significant for osteopenia has had a right shoulder fracture in the past.  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  Prior tobacco user quit in 1958.       Home Medications Prior to Admission medications   Medication Sig Start Date End Date Taking? Authorizing Provider  ondansetron (ZOFRAN-ODT) 4 MG disintegrating tablet Take 1 tablet (4 mg total) by mouth every 8 (eight) hours as needed for nausea or vomiting. 04/25/22  Yes Fredia Sorrow, MD  albuterol (VENTOLIN HFA) 108 (90 Base) MCG/ACT inhaler Inhale 2 puffs into the lungs every 6 (six) hours as needed for wheezing or shortness of breath. 04/27/21   McDiarmid, Blane Ohara, MD  calcium carbonate (OSCAL) 1500 (600 Ca) MG TABS tablet Take 600 mg by mouth 2 (two) times daily.    [provider]  Cholecalciferol (VITAMIN D3) 125 MCG (5000 UT) CAPS Take 125 mcg by mouth daily.    [provider]  Cyanocobalamin (VITAMIN B-12 PO) Take 1 tablet by mouth daily.    [provider]  denosumab (PROLIA) 60 MG/ML SOSY injection Inject 60 mg into the skin every 6 (six) months.    [provider]  GEMTESA 75 MG TABS Take 1 tablet by mouth daily. 01/28/21   [provider]   hydrochlorothiazide (MICROZIDE) 12.5 MG capsule Take 2 capsules (25 mg total) by mouth daily. 02/04/22   McDiarmid, Blane Ohara, MD  Menaquinone-7 (VITAMIN K2) 100 MCG CAPS Take 100 mcg by mouth daily.    [provider]  mirabegron ER (MYRBETRIQ) 50 MG TB24 tablet Take 1 tablet by mouth daily.    [provider]  ondansetron (ZOFRAN) 4 MG tablet Take 1 tablet (4 mg total) by mouth every 6 (six) hours as needed for nausea or vomiting. 05/10/21   Jeanell Sparrow, DO  Tiotropium Bromide-Olodaterol (STIOLTO RESPIMAT) 2.5-2.5 MCG/ACT AERS INHALE 2 PUFFS BY MOUTH INTO THE LUNGS DAILY 03/04/22   McDiarmid, Blane Ohara, MD  tiZANidine HCl (ZANAFLEX PO)     [provider]  triamcinolone cream (KENALOG) 0.1 % Apply 1 Application topically 2 (two) times daily. Rub into right leg redness once a day until redness resolves. 02/03/22   McDiarmid, Blane Ohara, MD      Allergies    Lisinopril and Ephedrine-guaifenesin    Review of Systems   Review of Systems  Constitutional:  Negative for chills and fever.  HENT:  Negative for ear pain and sore throat.   Eyes:  Negative for pain and visual disturbance.  Respiratory:  Negative for cough and shortness of breath.   Cardiovascular:  Negative for chest pain and palpitations.  Gastrointestinal:  Positive  for nausea. Negative for abdominal pain and vomiting.  Genitourinary:  Negative for dysuria and hematuria.  Musculoskeletal:  Negative for arthralgias, back pain, neck pain and neck stiffness.  Skin:  Negative for color change and rash.  Neurological:  Positive for headaches. Negative for seizures and syncope.  All other systems reviewed and are negative.   Physical Exam Updated Vital Signs BP 132/60   Pulse 83   Temp 98 F (36.7 C) (Oral)   Resp 18   Ht 1.524 m (5')   Wt 77.1 kg   SpO2 98%   BMI 33.20 kg/m  Physical Exam Vitals and nursing note reviewed.  Constitutional:      General: She is not in acute distress.    Appearance:  Normal appearance. She is well-developed. She is not ill-appearing.  HENT:     Head: Normocephalic and atraumatic.     Mouth/Throat:     Mouth: Mucous membranes are moist.  Eyes:     Extraocular Movements: Extraocular movements intact.     Conjunctiva/sclera: Conjunctivae normal.     Pupils: Pupils are equal, round, and reactive to light.  Cardiovascular:     Rate and Rhythm: Normal rate and regular rhythm.     Heart sounds: No murmur heard. Pulmonary:     Effort: Pulmonary effort is normal. No respiratory distress.     Breath sounds: Normal breath sounds.     Comments: Right anterior chest with about a 1.5 cm abrasion not a laceration to the right upper breast area. Chest:     Chest wall: No tenderness.  Abdominal:     Palpations: Abdomen is soft.     Tenderness: There is no abdominal tenderness.  Musculoskeletal:        General: Tenderness present. No swelling.     Cervical back: Neck supple.     Comments: Slight tenderness and bruising to the left thumb.  But good range of motion good cap refill.  Sensation intact.  Right shoulder area with tenderness to palpation no obvious deformity.  Radial pulse distally 2+.  Sensation intact.  Good movement of the fingers wrist elbow area.  Some limited range of motion to the shoulder.  Skin:    General: Skin is warm and dry.     Capillary Refill: Capillary refill takes less than 2 seconds.  Neurological:     General: No focal deficit present.     Mental Status: She is alert and oriented to person, place, and time.     Cranial Nerves: No cranial nerve deficit.     Sensory: No sensory deficit.     Motor: No weakness.     Coordination: Coordination normal.  Psychiatric:        Mood and Affect: Mood normal.     ED Results / Procedures / Treatments   Labs (all labs ordered are listed, but only abnormal results are displayed) Labs Reviewed - No data to display  EKG None  Radiology DG Shoulder Right  Result Date:  04/25/2022 CLINICAL DATA:  Fall EXAM: RIGHT SHOULDER - 2+ VIEW COMPARISON:  06/24/2020 FINDINGS: Healed fracture of the surgical neck of the humerus. No acute fracture or dislocation. Least mild acromioclavicular degenerative arthritis noted. Glenohumeral joint space is not well profiled. Limited evaluation of the right hemithorax is unremarkable. IMPRESSION: 1. No acute fracture or dislocation. Healed fracture of the surgical neck of the humerus. Electronically Signed   By: Helyn Numbers M.D.   On: 04/25/2022 22:47   DG Finger Thumb Left  Result Date: 04/25/2022 CLINICAL DATA:  Fall, left thumb pain EXAM: LEFT THUMB 2+V COMPARISON:  None Available. FINDINGS: Normal alignment. No acute fracture or dislocation. Mild degenerate arthritis involving the interphalangeal joint and minimal degenerative change involving the first carpometacarpal joint and metacarpophalangeal joint of the thumb. Soft tissues are unremarkable. IMPRESSION: 1. Mild degenerative change. No acute fracture or dislocation. Electronically Signed   By: Helyn Numbers M.D.   On: 04/25/2022 22:46   CT Head Wo Contrast  Result Date: 04/25/2022 CLINICAL DATA:  Fall EXAM: CT HEAD WITHOUT CONTRAST TECHNIQUE: Contiguous axial images were obtained from the base of the skull through the vertex without intravenous contrast. RADIATION DOSE REDUCTION: This exam was performed according to the departmental dose-optimization program which includes automated exposure control, adjustment of the mA and/or kV according to patient size and/or use of iterative reconstruction technique. COMPARISON:  None Available. FINDINGS: Brain: There is no acute intracranial hemorrhage, extra-axial fluid collection, or acute infarct. Background parenchymal volume is normal. The ventricles are normal in size. 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 Gray-white differentiation is otherwise preserved. Additional patchy hypodensity in the supratentorial white matter  likely reflects sequela of underlying chronic small-vessel ischemic change. The pituitary and suprasellar region are normal. There is no mass lesion. There is no mass effect or midline shift. Vascular: There is focal calcification in the right sylvian fissure which may reflect prior embolism. This would correspond to the right frontal infarct. Calcification is also seen in the basilar artery and carotid siphons. Skull: Normal. Negative for fracture or focal lesion. Sinuses/Orbits: The imaged paranasal sinuses are clear. Bilateral lens implants are in place. The globes and orbits are otherwise unremarkable. Other: None. IMPRESSION: 1. No acute intracranial pathology. 2. 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 Electronically Signed   By: Lesia Hausen M.D.   On: 04/25/2022 20:24    Procedures Procedures    Medications Ordered in ED Medications  ondansetron (ZOFRAN-ODT) disintegrating tablet 4 mg (4 mg Oral Given 04/25/22 2207)    ED Course/ Medical Decision Making/ A&P                           Medical Decision Making Amount and/or Complexity of Data Reviewed Radiology: ordered.  Risk Prescription drug management.   Patient status post fall at home.  Not on blood thinners.  Head CT without any acute findings which is very reassuring.  The patient also has some right shoulder pain previous injury that known to have a frozen shoulder.  Will get x-ray of that.  And has bruising and some mild tenderness to the left thumb area distally.  Will get an x-ray of that.  Will give patient Zofran ODT here.  Feeling better with this over ran.  Patient's x-ray of the left thumb and right shoulder without any acute abnormalities.  Will discharge patient home with Zofran OTD to take and she will continue to take extra and Tylenol follow-up with her doctors as needed.  Did discuss the possibility of mild concussion.  But no acute injuries related to the fall.   Final Clinical Impression(s) / ED  Diagnoses Final diagnoses:  Fall, initial encounter  Injury of head, initial encounter  Acute pain of right shoulder  Contusion of left thumb without damage to nail, initial encounter    Rx / DC Orders ED Discharge Orders          Ordered    ondansetron (ZOFRAN-ODT)  4 MG disintegrating tablet  Every 8 hours PRN        04/25/22 2357              Vanetta Mulders, MD 04/25/22 2209    Vanetta Mulders, MD 04/25/22 2357    Vanetta Mulders, MD 04/25/22 [id number]

## 2022-04-26 DIAGNOSIS — M810 Age-related osteoporosis without current pathological fracture: Secondary | ICD-10-CM | POA: Diagnosis not present

## 2022-04-26 DIAGNOSIS — E559 Vitamin D deficiency, unspecified: Secondary | ICD-10-CM | POA: Diagnosis not present

## 2022-04-29 ENCOUNTER — Encounter: Payer: Self-pay | Admitting: Family Medicine

## 2022-04-29 DIAGNOSIS — Z8673 Personal history of transient ischemic attack (TIA), and cerebral infarction without residual deficits: Secondary | ICD-10-CM | POA: Insufficient documentation

## 2022-05-05 ENCOUNTER — Telehealth: Payer: Self-pay

## 2022-05-05 NOTE — Telephone Encounter (Signed)
        Patient  visited [facility] on 1/9    Telephone encounter attempt :  1st  A HIPAA compliant voice message was left requesting a return call.  Instructed patient to call back       [facility], [place], Care Management  [phone] [address], [place], [place] [address] Phone: [phone] Email: [email or web address]    

## 2022-05-06 ENCOUNTER — Telehealth: Payer: Self-pay

## 2022-05-06 ENCOUNTER — Encounter: Payer: Self-pay | Admitting: Family Medicine

## 2022-05-06 ENCOUNTER — Ambulatory Visit (INDEPENDENT_AMBULATORY_CARE_PROVIDER_SITE_OTHER): Payer: Medicare PPO | Admitting: Family Medicine

## 2022-05-06 VITALS — BP 120/64 | HR 85 | Ht 60.0 in | Wt 170.0 lb

## 2022-05-06 DIAGNOSIS — Z8673 Personal history of transient ischemic attack (TIA), and cerebral infarction without residual deficits: Secondary | ICD-10-CM | POA: Diagnosis not present

## 2022-05-06 DIAGNOSIS — S40011A Contusion of right shoulder, initial encounter: Secondary | ICD-10-CM | POA: Diagnosis not present

## 2022-05-06 DIAGNOSIS — I639 Cerebral infarction, unspecified: Secondary | ICD-10-CM

## 2022-05-06 DIAGNOSIS — I10 Essential (primary) hypertension: Secondary | ICD-10-CM | POA: Diagnosis not present

## 2022-05-06 MED ORDER — HYDROCHLOROTHIAZIDE 25 MG PO TABS: 25.0000 mg | ORAL_TABLET | Freq: Every day | ORAL | 3 refills | Status: DC

## 2022-05-06 MED ORDER — ASPIRIN 81 MG PO TBEC: 81.0000 mg | DELAYED_RELEASE_TABLET | Freq: Every day | ORAL | 12 refills | Status: DC

## 2022-05-06 NOTE — Patient Instructions (Signed)
I think you have a moderate bruising contusion of your right shoulder and collar bone.   There was not obvious fracture of collar bone or shoulder bones on Xrays in the ED.  You have a blood clot (hematoma) over the collar bone that is making the space of your collar bone look different. Hematoma should resolve over the next month.    Take you acetaminophen as you have been doing.   If the pain increases, let Dr Aviyanna Colbaugh know.    Start Physical Therapy to get the range of motion back into the shoulder joint.     You have on heac CAT scan on old stroke in the right frontal lobe of your brain.  To prevent future strokes, please start taking a baby aspirin (81 mg) tablet daily.   Once your cholesterol tests are back, we should talk about cholesterol lowering medications.    To make sure there are no blood clots in the arteries of your neck  or heart, I ask that you go for two tests, carotid artery ultrasounds, andan echocardiogram of your heart.

## 2022-05-06 NOTE — Progress Notes (Signed)
Heather Andrews is alone Sources of clinical information for visit is/are patient. Nursing assessment for this office visit was reviewed with the patient for accuracy and revision.     Previous Report(s) Reviewed: ER records and head CT and shoulder Xrays from 04/25/22     02/03/2022    3:29 PM  Depression screen PHQ 2/9  Decreased Interest 0  Down, Depressed, Hopeless 0  PHQ - 2 Score 0  Altered sleeping 1  Tired, decreased energy 0  Change in appetite 0  Feeling bad or failure about yourself  0  Trouble concentrating 0  Moving slowly or fidgety/restless 0  Suicidal thoughts 0  PHQ-9 Score 1  Difficult doing work/chores Not difficult at all   [facility] Visit from 02/03/2022 in [facility] Office Visit from 10/07/2021 in [facility] Office Visit from 09/02/2021 in [facility]  Thoughts that you would be better off dead, or of hurting yourself in some way Not at all Not at all --  PHQ-9 Total Score 1 0 1          02/03/2022    3:28 PM 10/07/2021    8:57 AM 09/02/2021    9:01 AM 07/01/2021    8:52 AM 03/18/2021    1:43 PM  Fall Risk   Falls in the past year? 0 0 0 0 1  Number falls in past yr: 0 0 0 0 0  Injury with Fall? 0 0 0 0 0       02/03/2022    3:29 PM 10/07/2021    8:57 AM 09/02/2021    9:01 AM  PHQ9 SCORE ONLY  PHQ-9 Total Score 1 0 1    There are no preventive care reminders to display for this patient.  Health Maintenance Due  Topic Date Due   Medicare Annual Wellness (AWV)  Never done   Lung Cancer Screening  Never done   Zoster Vaccines- Shingrix (2 of 2) 11/12/2021   COVID-19 Vaccine (5 - 2023-24 season) 12/17/2021      History/P.E. limitations: none  There are no preventive care reminders to display for this patient. There are no preventive care reminders to display for this patient.  Health Maintenance Due  Topic Date Due   Medicare Annual Wellness (AWV)  Never done    Lung Cancer Screening  Never done   Zoster Vaccines- Shingrix (2 of 2) 11/12/2021   COVID-19 Vaccine (5 - 2023-24 season) 12/17/2021     No chief complaint on file.    --------------------------------------------------------------------------------------------------------------------------------------------- Visit Problem List with A/P  No problem-specific Assessment & Plan notes found for this encounter.     02/03/2022    3:28 PM 10/07/2021    8:57 AM 09/02/2021    9:01 AM 07/01/2021    8:52 AM 03/18/2021    1:43 PM  Fall Risk   Falls in the past year? 0 0 0 0 1  Number falls in past yr: 0 0 0 0 0  Injury with Fall? 0 0 0 0 0    @10RELATIVEDAYS @    07/01/2021    8:52 AM 09/02/2021    9:01 AM 10/07/2021    8:57 AM 02/03/2022    3:28 PM 04/25/2022    7:49 PM  Fall Risk  Falls in the past year? 0 0 0 0   Was there an injury with Fall? 0 0 0 0   Fall Risk Category Calculator 0 0 0 0  Fall Risk Category (Retired) Low Low Low Low   (RETIRED) Patient Fall Risk Level  Low fall risk Low fall risk  High fall risk     FALL Number of falls in last year: 1 Location: living room of home Activity prior to fall: walking across room Change in position prior to fall: no Dizziness prior to fall:  no Syncope prior to fall:  no Vertigo:  no Chest Pain/Palpitations:  no  Something that caused the fall (steps, curbs, uneven surface, slippery surface, shoes, etc.): Tripped over edge of carpet Loss of balance:   no Slip: no Trip: yes Slide:  no  What Body parts struck object(s) or ground: right shoulder and right precordium, right temple  What object or surface was struck: edge of table Injury: yes, contusions Loss urine or stool: no  Able to get up unassisted when you fell: yes.  How long did that take to get up? 0 minutes  Illness at time of fall: no Change in medications: no Medications that make lightheaded or tired: no Take medication for sleep: no  Of note, patient had  had pupils dilated that morning for ophthalmologic examination.  No major concerns about vision indicated from the ophth exam    Uses a  assist device, e.g., cane, walker, wheelchair:  no Holds onto furniture when walking inside home: no Pushes up with her hands when standing up from chair: no  On Prolia  VS reviewed Normal gait.  Rsing from chair without using arms Holding right arm inprotective fashion, abducted at shoulder and flexed at elbow, against torso. Loss of medial supraclavicular space c.t. with left.  No palpable defect along right clavicle. There is palp firmness ~ 5-6 cm long and ~ 2-3 cm wide in right supraclvicular fossa.  Nontender firmness.  No increase warmth.  Not TTP.   Linear abrasions in midclavicular precordium above breast and obvious brusing of skin in semicircular distirbution below abrasions. NonTTP  Right shoulder abduction ~ 30 degrees - limit by pain   CPT E&M Office Visit Time Before Visit; reviewing medical records (e.g. recent visits, labs, studies): 9 minutes During Visit (F2F time): 20 minutes After Visit (discussion with family or HCP, prescribing, ordering, referring, calling result/recommendations or documenting on same day): 10 minutes Total Visit Time: 39 minutes

## 2022-05-06 NOTE — Assessment & Plan Note (Addendum)
New problem No overt right fracture of shoulder nor right clavicle o review ED X-rays nor exam today. There is a presumed supraclavicular hematoma , brusing on right precordium and limited range of motion of right shoulder (pain with movement, and patient with history of right frozen shoulder after right humeral surgical neck fracture in past) Adequate analgesa with APAP prn Use of sling for comfort Referral Physical Therapy for ROM RTC 4 weeks

## 2022-05-06 NOTE — Assessment & Plan Note (Signed)
New CT finding of remote right frontal, asymptomatic infarct.  Refiewed with Ms Eber CT finding  Recommended start ASA 81 mg daily Checking Lipid panel with likely start of statin Checking carotid arterys for stenosis and transthoracic echocardiogram for embolic source.  RTC 4 weeks.

## 2022-05-06 NOTE — Telephone Encounter (Signed)
     Patient  visit on 1/9  at [facility]    Have you been able to follow up with your primary care physician? Yes   The patient was or was not able to obtain any needed medicine or equipment.  Yes  Are there diet recommendations that you are having difficulty following? Na   Patient expresses understanding of discharge instructions and education provided has no other needs at this time.  Yes     [facility], [facility], [place], Care Management  [phone] [address], [place], [place] [address] Phone: [phone] Email: [email or web address]

## 2022-05-07 LAB — LIPID PANEL
Chol/HDL Ratio: 6.4 ratio — ABNORMAL HIGH (ref 0.0–4.4)
Cholesterol, Total: 212 mg/dL — ABNORMAL HIGH (ref 100–199)
HDL: 33 mg/dL — ABNORMAL LOW (ref >39)
LDL Chol Calc (NIH): 139 mg/dL — ABNORMAL HIGH (ref 0–99)
Triglycerides: 222 mg/dL — ABNORMAL HIGH (ref 0–149)
VLDL Cholesterol Cal: 40 mg/dL (ref 5–40)

## 2022-05-09 ENCOUNTER — Telehealth (HOSPITAL_BASED_OUTPATIENT_CLINIC_OR_DEPARTMENT_OTHER): Payer: Self-pay | Admitting: *Deleted

## 2022-05-09 NOTE — Telephone Encounter (Signed)
Left message for the referral coordinator at [facility] to call and discuss insurance prior authorization for Carotid doppler and Echocardiogram ordered by Dr. Wendy Poet

## 2022-05-16 ENCOUNTER — Telehealth (HOSPITAL_BASED_OUTPATIENT_CLINIC_OR_DEPARTMENT_OTHER): Payer: Self-pay | Admitting: *Deleted

## 2022-05-16 NOTE — Telephone Encounter (Signed)
ECHO auth with [employer] [id number] valid 01/29-02/28/2024.    No PA needed for carotid US.     Thanks [employer]

## 2022-05-16 NOTE — Telephone Encounter (Signed)
Left message for patient to call and discuss scheduling the Echocardiogram and Carotid doppler ordered by Dr. Sherren Mocha  McDiarmid

## 2022-05-18 ENCOUNTER — Telehealth: Payer: Self-pay | Admitting: Family Medicine

## 2022-05-18 DIAGNOSIS — E7800 Pure hypercholesterolemia, unspecified: Secondary | ICD-10-CM

## 2022-05-18 DIAGNOSIS — Z8673 Personal history of transient ischemic attack (TIA), and cerebral infarction without residual deficits: Secondary | ICD-10-CM

## 2022-05-18 MED ORDER — ROSUVASTATIN CALCIUM 20 MG PO TABS: 20.0000 mg | ORAL_TABLET | Freq: Every day | ORAL | 1 refills | Status: DC

## 2022-05-18 NOTE — Telephone Encounter (Signed)
I spoke with Ms Signer about her high LDL-cholesterol in the setting of her asymptomatic stroke seen on recent Head CT (for fall eval in ED). She agreed to starting rosuvastatin 20 mg daily, with recheck of LDL-C in 3 months with lab visit.  Goal is tolerability of medication and LDL-C < 70.

## 2022-05-24 ENCOUNTER — Other Ambulatory Visit (HOSPITAL_BASED_OUTPATIENT_CLINIC_OR_DEPARTMENT_OTHER): Payer: Medicare PPO

## 2022-06-03 ENCOUNTER — Other Ambulatory Visit (HOSPITAL_BASED_OUTPATIENT_CLINIC_OR_DEPARTMENT_OTHER): Payer: Medicare PPO

## 2022-06-06 ENCOUNTER — Ambulatory Visit (INDEPENDENT_AMBULATORY_CARE_PROVIDER_SITE_OTHER): Payer: Medicare PPO

## 2022-06-06 DIAGNOSIS — I639 Cerebral infarction, unspecified: Secondary | ICD-10-CM

## 2022-06-06 DIAGNOSIS — I6389 Other cerebral infarction: Secondary | ICD-10-CM | POA: Diagnosis not present

## 2022-06-06 DIAGNOSIS — Z8673 Personal history of transient ischemic attack (TIA), and cerebral infarction without residual deficits: Secondary | ICD-10-CM

## 2022-06-06 LAB — ECHOCARDIOGRAM COMPLETE
Area-P 1/2: 3.99 cm2
S' Lateral: 1.87 cm

## 2022-06-09 ENCOUNTER — Encounter: Payer: Self-pay | Admitting: Family Medicine

## 2022-06-09 ENCOUNTER — Ambulatory Visit (INDEPENDENT_AMBULATORY_CARE_PROVIDER_SITE_OTHER): Payer: Medicare PPO | Admitting: Family Medicine

## 2022-06-09 VITALS — BP 136/70 | HR 75 | Ht 60.0 in | Wt 169.0 lb

## 2022-06-09 DIAGNOSIS — I10 Essential (primary) hypertension: Secondary | ICD-10-CM

## 2022-06-09 DIAGNOSIS — Z8673 Personal history of transient ischemic attack (TIA), and cerebral infarction without residual deficits: Secondary | ICD-10-CM

## 2022-06-09 DIAGNOSIS — E7800 Pure hypercholesterolemia, unspecified: Secondary | ICD-10-CM

## 2022-06-09 DIAGNOSIS — S40011D Contusion of right shoulder, subsequent encounter: Secondary | ICD-10-CM

## 2022-06-09 NOTE — Progress Notes (Signed)
Heather Andrews is alone Sources of clinical information for visit is/are patient. Nursing assessment for this office visit was reviewed with the patient for accuracy and revision.     Previous Report(s) Reviewed: reports on carotid dopplers and transthoracic echocardiogram 06/06/22     06/09/2022    9:39 AM  Depression screen PHQ 2/9  Decreased Interest 0  Down, Depressed, Hopeless 0  PHQ - 2 Score 0  Altered sleeping 0  Tired, decreased energy 1  Change in appetite 0  Feeling bad or failure about yourself  0  Trouble concentrating 0  Moving slowly or fidgety/restless 0  Suicidal thoughts 0  PHQ-9 Score 1  Difficult doing work/chores Somewhat difficult   [facility] Visit from 06/09/2022 in [facility] Office Visit from 05/06/2022 in [facility] Office Visit from 02/03/2022 in [facility]  Thoughts that you would be better off dead, or of hurting yourself in some way Not at all Not at all Not at all  PHQ-9 Total Score '1 3 1          '$ 06/09/2022    9:39 AM 05/06/2022    3:10 PM 02/03/2022    3:28 PM 10/07/2021    8:57 AM 09/02/2021    9:01 AM  [place] in the past year? 1 1 0 0 0  Number falls in past yr: 0 0 0 0 0  Injury with Fall? 1 1 0 0 0       06/09/2022    9:39 AM 05/06/2022    3:10 PM 02/03/2022    3:29 PM  PHQ9 SCORE ONLY  PHQ-9 Total Score '1 3 1    '$ There are no preventive care reminders to display for this patient.  Health Maintenance Due  Topic Date Due   Medicare Annual Wellness (AWV)  Never done   Lung Cancer Screening  Never done   Zoster Vaccines- Shingrix (2 of 2) 11/12/2021      History/P.E. limitations: none  There are no preventive care reminders to display for this patient. There are no preventive care reminders to display for this patient.  Health Maintenance Due  Topic Date Due   Medicare Annual Wellness (AWV)  Never done   Lung Cancer Screening  Never  done   Zoster Vaccines- Shingrix (2 of 2) 11/12/2021     Chief Complaint  Patient presents with   Hypertension     --------------------------------------------------------------------------------------------------------------------------------------------- Visit Problem List with A/P  History of asymptomatic stroke Established problem. Stable.  Reviewed results of Carotid Artery dopplers and transthoracic echocardiogram with Heather Andrews. She is taking daily ASA, rosuvastatin, and antihypertensive medications without ADE.  Plan to continue secondary prevention medications and monitoring  Hypertension Established problem. Adequate blood pressure control.  No evidence of new end organ damage.  Tolerating medication without significant adverse effects.  Plan to continue current blood pressure medication regiment.   May add ARB (ACEI cough history) if further BP control becomes necessary.  Pure hypercholesterolemia Established problem Heather Andrews is tolerating the rosuvastatin 20 mg daily.  Plan is to continue rosuvastatin Recheck LDL-C at next office visit in 3 months with goal LDL < 70.   Contusion of right shoulder region Established problem Continued chronic limitation in right shoulder ROM.  Abdomin about 30 degrees and flexion about 40-50 degress.  Pain worse with active cross-adduction Tender in posterior axillary line to moderate to deep palpation.  There remains an area  of soft tissue elevation in medial supraclavicular area that in non-tender, without bruit, non-pulsatile, without overlying erythema or bruising.  There is no corresponding similar appearance on left side.  Heather Andrews reports that she did not receive a call from [facility] office Physical Therapy after our office placed a referral 05/06/22.  She declines repeat referral at this time, saying she has a list of exercises she was given by Physical Therapy in past after right humeral fracture, and will restart  them.    am at a loss to explain it unless it is some post-traumatic seroma or some such entity. Will monitor the abnormality of the right medial tissue elevation for further changes or resolution.  An soft tissue ultrasound may help if more diagnostic testing is chosen.

## 2022-06-09 NOTE — Patient Instructions (Signed)
Your blood pressure looks very well contolled on the HCTZ 25 mg daily.  Please keep taking your rosuvastatin 20 mg daily.   Stay as active as you can.  Unless your are having trouble driving, it is okay to keep doing so.  If your shoulder gets worse or you want Physical Therapy, please let Dr Ileah Falkenstein know. He will send in a referral.

## 2022-06-10 NOTE — Assessment & Plan Note (Signed)
Established problem Heather Andrews is tolerating the rosuvastatin 20 mg daily.  Plan is to continue rosuvastatin Recheck LDL-C at next office visit in 3 months with goal LDL < 70.

## 2022-06-10 NOTE — Assessment & Plan Note (Addendum)
Established problem. Stable.  Reviewed results of Carotid Artery dopplers and transthoracic echocardiogram with Ms Mallis. She is taking daily ASA, rosuvastatin, and antihypertensive medications without ADE.  Plan to continue secondary prevention medications and monitoring

## 2022-06-10 NOTE — Assessment & Plan Note (Signed)
Established problem. Adequate blood pressure control.  No evidence of new end organ damage.  Tolerating medication without significant adverse effects.  Plan to continue current blood pressure medication regiment.   May add ARB (ACEI cough history) if further BP control becomes necessary.

## 2022-06-10 NOTE — Assessment & Plan Note (Signed)
Established problem Continued chronic limitation in right shoulder ROM.  Abdomin about 30 degrees and flexion about 40-50 degress.  Pain worse with active cross-adduction Tender in posterior axillary line to moderate to deep palpation.  There remains an area of soft tissue elevation in medial supraclavicular area that in non-tender, without bruit, non-pulsatile, without overlying erythema or bruising.  There is no corresponding similar appearance on left side.  Heather Andrews reports that she did not receive a call from [place] [facility] after our office placed a referral 05/06/22.  She declines repeat referral at this time, saying she has a list of exercises she was given by Physical Therapy in past after right humeral fracture, and will restart them.    am at a loss to explain it unless it is some post-traumatic seroma or some such entity. Will monitor the abnormality of the right medial tissue elevation for further changes or resolution.  An soft tissue ultrasound may help if more diagnostic testing is chosen.

## 2022-07-14 DIAGNOSIS — Z1231 Encounter for screening mammogram for malignant neoplasm of breast: Secondary | ICD-10-CM | POA: Diagnosis not present

## 2022-07-18 DIAGNOSIS — R599 Enlarged lymph nodes, unspecified: Secondary | ICD-10-CM | POA: Insufficient documentation

## 2022-07-18 DIAGNOSIS — L821 Other seborrheic keratosis: Secondary | ICD-10-CM | POA: Diagnosis not present

## 2022-07-18 DIAGNOSIS — L720 Epidermal cyst: Secondary | ICD-10-CM | POA: Diagnosis not present

## 2022-07-18 DIAGNOSIS — L570 Actinic keratosis: Secondary | ICD-10-CM | POA: Diagnosis not present

## 2022-07-18 HISTORY — DX: Enlarged lymph nodes, unspecified: R59.9

## 2022-07-19 ENCOUNTER — Telehealth: Payer: Self-pay | Admitting: Family Medicine

## 2022-07-19 DIAGNOSIS — S40011S Contusion of right shoulder, sequela: Secondary | ICD-10-CM

## 2022-07-19 DIAGNOSIS — S40019D Contusion of unspecified shoulder, subsequent encounter: Secondary | ICD-10-CM | POA: Diagnosis not present

## 2022-07-19 NOTE — Telephone Encounter (Signed)
I spoke with Heather Andrews about the screening mammogram abnormality that could be an  intramammary lymphadenopathy and recommendations for diagnostic testing. Heather Andrews said she was scheduled to see [facility] tomorrow for further testing. Screening mammogram report to be scanned into Chart Report media  Await further testing results.

## 2022-07-20 DIAGNOSIS — N6321 Unspecified lump in the left breast, upper outer quadrant: Secondary | ICD-10-CM | POA: Diagnosis not present

## 2022-07-20 DIAGNOSIS — N6323 Unspecified lump in the left breast, lower outer quadrant: Secondary | ICD-10-CM | POA: Diagnosis not present

## 2022-07-20 DIAGNOSIS — R928 Other abnormal and inconclusive findings on diagnostic imaging of breast: Secondary | ICD-10-CM | POA: Diagnosis not present

## 2022-07-21 ENCOUNTER — Encounter: Payer: Self-pay | Admitting: Family Medicine

## 2022-07-26 DIAGNOSIS — S40019D Contusion of unspecified shoulder, subsequent encounter: Secondary | ICD-10-CM | POA: Diagnosis not present

## 2022-07-27 ENCOUNTER — Other Ambulatory Visit: Payer: Self-pay | Admitting: Radiology

## 2022-07-27 ENCOUNTER — Other Ambulatory Visit (HOSPITAL_COMMUNITY)
Admission: RE | Admit: 2022-07-27 | Discharge: 2022-07-27 | Disposition: A | Discharge status: Home / Self Care | Payer: Medicare PPO | Source: Ambulatory Visit | Attending: Family Medicine | Admitting: Family Medicine

## 2022-07-27 DIAGNOSIS — C8222 Follicular lymphoma grade III, unspecified, intrathoracic lymph nodes: Secondary | ICD-10-CM | POA: Diagnosis not present

## 2022-07-27 DIAGNOSIS — R590 Localized enlarged lymph nodes: Secondary | ICD-10-CM | POA: Diagnosis not present

## 2022-07-27 DIAGNOSIS — C8204 Follicular lymphoma grade I, lymph nodes of axilla and upper limb: Secondary | ICD-10-CM | POA: Diagnosis not present

## 2022-07-27 DIAGNOSIS — C8202 Follicular lymphoma grade I, intrathoracic lymph nodes: Secondary | ICD-10-CM | POA: Diagnosis not present

## 2022-08-02 DIAGNOSIS — S40019D Contusion of unspecified shoulder, subsequent encounter: Secondary | ICD-10-CM | POA: Diagnosis not present

## 2022-08-03 NOTE — Progress Notes (Signed)
HEMATOLOGY/ONCOLOGY CONSULTATION NOTE  Date of Service: 08/04/22  Patient Care Team: McDiarmid, Leighton Roach, MD as PCP - General (Family Medicine)  CHIEF COMPLAINTS/PURPOSE OF CONSULTATION:  Evaluation and management of newly-diagnosed follicular lymphoma.  HISTORY OF PRESENTING ILLNESS:   Heather Andrews is a wonderful 82 y.o. female who has been referred to [place] by [name], MD for evaluation and management of newly-diagnosed low-grade follicular lymphoma.   Today, she is accompanied by her daughter. She reports that she has had right LE edema for about 6 months. Upon examination, patient has bilateral LE edema, right more than left.   She also reports having a rash at one point. Patient denies any medication changes, long distance travel, or unusual activity. Patient denies any redness/pain in the area.  Patient denies back pain, recent infections, abdominal pain, change in bowel habits, fever, chills, or night sweats.  She does report that she tripped and suffered a fall in January 2024, causing her to hit the side of her head.  Patient denies any breast discomfort or swelling besides in her LE. She does however sometimes experience tingling in her hands.  Patient does confirm having a silent stroke at some point previously, but is unsure of when it occurred.   Patient does report that she is "slowing down" in general, which she attributes to her age. Patient denies any sudden new fatigue or other major limiting medical issues at this time. Patient does regualrly take vitamin D, but is unsure of how many units she takes. She also takes vitamin K. Patient denies any concern for varicose veins in the past. Patient reports that she will be traveling to [facility] for a week and will leave May 4th.  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 Fontaine, OBGYN).   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   Sensorineural hearing loss (SNHL) of both ears    Patient wears biaural hearing aids   Stress incontinence, female    USI    SURGICAL HISTORY: Past Surgical History:  Procedure Laterality Date   BASAL CELL CARCINOMA EXCISION     CATARACT EXTRACTION     Cyst removed from chest     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"    SOCIAL HISTORY: Social History   Socioeconomic History   Marital status: Widowed    Spouse name: Not on file   Number of children: 3   Years of education: 57   Highest education level: Not on file  Occupational History   Occupation: Tourist information centre manager    Comment: Retired  Tobacco Use   Smoking status: Former    Packs/day: 0.50    Years: 45.00    Additional pack years: 0.00    Total pack years: 22.50    Types: Cigarettes    Start date: 04/1956   Smokeless tobacco: Never  Vaping Use   Vaping Use: Never used  Substance and Sexual Activity   Alcohol use: Yes    Comment: occassionally   Drug  use: No   Sexual activity: Never    Comment: 1st intercourse 82 yo-Fewer than 5 partners  Other Topics Concern   Not on file  Social History Narrative   Widowed, lives alone though a daughter lives next door.   3 children: [name]; [name], Lawernce Pitts   Activities: reading, interacting with her two cats, playing cards, corresponding with others (email, snail mail), needlework, cooking   Patient indicated on Geri Assessment questionnaire that she would want her daughter, Clelia Croft to make medical decisions on her behalf should she be unable. [phone]).  The patient gives permission to speak to Ms  Romnet about issue around the patient's health.      Patient lives in a townhouse.  13 steps to get into home.  Home has two levels.    Assists Devices in home: Grab Bars   Regular exercise: Yoga once a week   Transportation: Patient drives   Social Determinants of Health   Financial Resource Strain: Low Risk  (03/19/2021)   Overall Financial Resource Strain (CARDIA)    Difficulty of Paying Living Expenses: Not hard at all  Food Insecurity: No Food Insecurity (03/19/2021)   Hunger Vital Sign    Worried About Running Out of Food in the Last Year: Never true    Ran Out of Food in the Last Year: Never true  Transportation Needs: No Transportation Needs (03/19/2021)   PRAPARE - Administrator, Civil Service (Medical): No    Lack of Transportation (Non-Medical): No  Physical Activity: Unknown (03/19/2021)   Exercise Vital Sign    Days of Exercise per Week: 0 days    Minutes of Exercise per Session: Not on file  Stress: Not on file  Social Connections: Moderately Integrated (03/19/2021)   Social Connection and Isolation Panel [NHANES]    Frequency of Communication with Friends and Family: More than three times a week    Frequency of Social Gatherings with Friends and Family: More than three times a week    Attends Religious Services: More than 4 times per year    Active Member of [employer] or Organizations: Yes    Attends Banker Meetings: More than 4 times per year    Marital Status: Widowed  Intimate Partner Violence: Not At Risk (03/19/2021)   Humiliation, Afraid, Rape, and Kick questionnaire    Fear of Current or Ex-Partner: No    Emotionally Abused: No    Physically Abused: No    Sexually Abused: No    FAMILY HISTORY: Family History  Problem Relation Age of Onset   Hypertension Mother    Heart disease Paternal Grandmother    Heart disease Paternal Grandfather    Cancer Brother        Leukemia    ALLERGIES:  is allergic to lisinopril and  ephedrine-guaifenesin.  MEDICATIONS:  Current Outpatient Medications  Medication Sig Dispense Refill   albuterol (VENTOLIN HFA) 108 (90 Base) MCG/ACT inhaler Inhale 2 puffs into the lungs every 6 (six) hours as needed for wheezing or shortness of breath. 18 g 5   aspirin EC 81 MG tablet Take 1 tablet (81 mg total) by mouth daily. Swallow whole. 30 tablet 12   calcium carbonate (OSCAL) 1500 (600 Ca) MG TABS tablet Take 600 mg by mouth 2 (two) times daily.     Cholecalciferol (VITAMIN D3) 125 MCG (5000 UT) CAPS Take 125 mcg by mouth daily.     Cyanocobalamin (VITAMIN B-12 PO) Take 1 tablet by mouth  daily.     denosumab (PROLIA) 60 MG/ML SOSY injection Inject 60 mg into the skin every 6 (six) months.     GEMTESA 75 MG TABS Take 1 tablet by mouth daily.     hydrochlorothiazide (HYDRODIURIL) 25 MG tablet Take 1 tablet (25 mg total) by mouth daily. 90 tablet 3   Menaquinone-7 (VITAMIN K2) 100 MCG CAPS Take 100 mcg by mouth daily.     rosuvastatin (CRESTOR) 20 MG tablet Take 1 tablet (20 mg total) by mouth daily. 90 tablet 1   Tiotropium Bromide-Olodaterol (STIOLTO RESPIMAT) 2.5-2.5 MCG/ACT AERS INHALE 2 PUFFS BY MOUTH INTO THE LUNGS DAILY 4 g 11   amLODipine (NORVASC) 5 MG tablet 1 tablet. (Patient not taking: Reported on 08/04/2022)     tiZANidine HCl (ZANAFLEX PO)  (Patient not taking: Reported on 08/04/2022)     triamcinolone cream (KENALOG) 0.1 % Apply 1 Application topically 2 (two) times daily. Rub into right leg redness once a day until redness resolves. (Patient not taking: Reported on 08/04/2022) 45 g 0   No current facility-administered medications for this visit.    REVIEW OF SYSTEMS:    10 Point review of Systems was done is negative except as noted above.  PHYSICAL EXAMINATION: ECOG PERFORMANCE STATUS: 1 - Symptomatic but completely ambulatory  . Vitals:   08/04/22 0920  BP: (!) 140/42  Pulse: 88  Resp: 16  Temp: 97.7 F (36.5 C)  SpO2: 95%   Filed Weights   08/04/22 0920   Weight: 168 lb 8 oz (76.4 kg)   .Body mass index is 32.91 kg/m.  GENERAL:alert, in no acute distress and comfortable SKIN: no acute rashes, no significant lesions EYES: conjunctiva are pink and non-injected, sclera anicteric OROPHARYNX: MMM, no exudates, no oropharyngeal erythema or ulceration NECK: supple, no JVD LYMPH:  no palpable lymphadenopathy in the cervical, axillary or inguinal regions LUNGS: clear to auscultation b/l with normal respiratory effort HEART: regular rate & rhythm ABDOMEN:  normoactive bowel sounds , non tender, not distended. Extremity: b/l lower extremity swelling R>L PSYCH: alert & oriented x 3 with fluent speech NEURO: no focal motor/sensory deficits  LABORATORY DATA:  I have reviewed the data as listed  .    Latest Ref Rng & Units 08/04/2022   10:06 AM 02/03/2022    4:53 PM 05/10/2021    8:03 AM  CBC  WBC 4.0 - 10.5 K/uL 8.7  10.2  12.3   Hemoglobin 12.0 - 15.0 g/dL 14.7  82.9  56.2   Hematocrit 36.0 - 46.0 % 36.1  36.6  40.0   Platelets 150 - 400 K/uL 196  239  251     .    Latest Ref Rng & Units 08/04/2022   10:06 AM 02/03/2022    4:53 PM 05/10/2021    7:24 AM  CMP  Glucose 70 - 99 mg/dL 130  865  784   BUN 8 - 23 mg/dL 18  21  13    Creatinine 0.44 - 1.00 mg/dL 6.96  2.95  2.84   Sodium 135 - 145 mmol/L 139  141  134   Potassium 3.5 - 5.1 mmol/L 4.0  4.5  3.7   Chloride 98 - 111 mmol/L 100  99  99   CO2 22 - 32 mmol/L 33  27  25   Calcium 8.9 - 10.3 mg/dL 13.2  9.1  8.6   Total Protein 6.5 - 8.1 g/dL 7.3  7.0  7.2   Total Bilirubin 0.3 - 1.2 mg/dL  0.4  0.2  0.7   Alkaline Phos 38 - 126 U/L 59  60  42   AST 15 - 41 U/L ALT 0 - 44 U/L Needle core biopsy 07/27/2022:   Breast Ultrasound 07/20/2022:  Ultrasound guided biopsy:  Ultrasound guided biopsy:       RADIOGRAPHIC STUDIES: I have personally reviewed the radiological images as listed and agreed with the findings in the report. No results  found.  ASSESSMENT & PLAN:   Wonderful 82 y.o. female with:  Newly-diagnosed low grade follicular lymphoma incidentally noted LNadenopathy on routine mammogram. History of asymptomatic stroke  Hypertension  Pure hypercholesterolemia  Contusion of right shoulder region   PLAN:  -Discussed results of 04/25/2022 CT head scan which revealed 1. No acute intracranial pathology. 2. 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  -discussed details of 07/21/2022 mammogram -Discussed lab results from 02/03/2023 with patient. CBC normal, WBC of 10.2K, hemoglobin of 12.4, and platelets of 239K. -Ki-67 5% -discussed details of low grade/grade 1 non-hodgkin's lymphoma -discussed criteria for staging of disease -discussed that staging may need a bone marrow examination for further evaluation but holding off for now. -discussed treatment/monitoring options depending on staging of disease in detail -Discussed that treatment would be initiated if disease grows to a large area that may cause focal symptoms, anemia, low blood counts, lymphodema, or affects function of an important organ -discussed that there may be constitutional symptoms such as unexplained fever, night sweats, or fatigue. Patient is aware of symptoms to monitor for and shall call us if any new concerns. -recommended [place] to rule out any enlarged lymph nodes or blood clots in LE -discussed details of reccommended blood tests, including LDH as a marker for lymphoma activity, CBC, and CMP -recommended patient to stay UTD with vaccinations including RSV, COVID-19 booster, and influenza -Recommend patient to regularly take vitamin D supplements to boost immune system -discussed patient's vitamin D level goal of 60-90 -discussed option of high-dose vitamin D-2 if needed -recommended patient to utilize compression socks to improve LE edema -discussed mammogram incidental finding of low grade follicular lymphoma -answered all of patient's and  patient's daughter's questions in detail -order blood test -order whole body PET scan to further evaluate staging of disease -order ultrasound of LE for further evaluation of lower extremity edema to r/o DVT  FOLLOW-UP: Labs today [place] bilateral lower extremities venous ASAP PET/CT in 5 days RTC with Dr Candise Che in 2 weeks  The total time spent in the appointment was 63 minutes* .  All of the patient's questions were answered with apparent satisfaction. The patient knows to call the clinic with any problems, questions or concerns.   Wyvonnia Lora MD MS AAHIVMS [facility] Hematology/Oncology Physician [facility]  .*Total Encounter Time as defined by the Centers for Medicare and Medicaid Services includes, in addition to the face-to-face time of a patient visit (documented in the note above) non-face-to-face time: obtaining and reviewing outside history, ordering and reviewing medications, tests or procedures, care coordination (communications with other health care professionals or caregivers) and documentation in the medical record.    I,Mitra Faeizi,acting as a Neurosurgeon for Wyvonnia Lora, MD.,have documented all relevant documentation on the behalf of Wyvonnia Lora, MD,as directed by  Wyvonnia Lora, MD while in the presence of Wyvonnia Lora, MD.  .I have reviewed the above documentation for accuracy and completeness, and I agree with the above. Marland Kitchen  [name] MD   ADDENDUM  US venous b/l lower extremities 08/05/2022   BILATERAL:  -No evidence of popliteal cyst, bilaterally.  RIGHT:  - There is no evidence of deep vein thrombosis in the lower extremity.    - Subcutaneous edema in area of calf and ankle.  - Ultrasound characteristics of enlarged lymph nodes are noted in the  groin.    LEFT:  - Findings consistent with acute deep vein thrombosis involving the left  peroneal veins.  - Findings consistent with acute superficial vein thrombosis involving the  left  varicosities or other superficial veins.  - Ultrasound characteristics of enlarged lymph nodes noted in the groin.    PLAN -Patient started on Eliquis 5 mg twice daily without loading dose for left calf DVT in the context of her follicular lymphoma. Most of the swelling seems to be related to likely lymphadenopathy that is causing some lymphedema and possible venous compression. -Will await PET scan to evaluate her lymphadenopathy and staging of follicular lymphoma in more detail.

## 2022-08-04 ENCOUNTER — Inpatient Hospital Stay: Payer: Medicare PPO

## 2022-08-04 ENCOUNTER — Other Ambulatory Visit: Payer: Self-pay

## 2022-08-04 ENCOUNTER — Inpatient Hospital Stay: Payer: Medicare PPO | Attending: Hematology | Admitting: Hematology

## 2022-08-04 VITALS — BP 140/42 | HR 88 | Temp 97.7°F | Resp 16 | Wt 168.5 lb

## 2022-08-04 DIAGNOSIS — C8294 Follicular lymphoma, unspecified, lymph nodes of axilla and upper limb: Secondary | ICD-10-CM | POA: Diagnosis not present

## 2022-08-04 DIAGNOSIS — Z806 Family history of leukemia: Secondary | ICD-10-CM | POA: Insufficient documentation

## 2022-08-04 DIAGNOSIS — M7989 Other specified soft tissue disorders: Secondary | ICD-10-CM

## 2022-08-04 DIAGNOSIS — Z87891 Personal history of nicotine dependence: Secondary | ICD-10-CM

## 2022-08-04 DIAGNOSIS — I824Z2 Acute embolism and thrombosis of unspecified deep veins of left distal lower extremity: Secondary | ICD-10-CM

## 2022-08-04 DIAGNOSIS — I10 Essential (primary) hypertension: Secondary | ICD-10-CM | POA: Insufficient documentation

## 2022-08-04 DIAGNOSIS — E559 Vitamin D deficiency, unspecified: Secondary | ICD-10-CM

## 2022-08-04 DIAGNOSIS — E7800 Pure hypercholesterolemia, unspecified: Secondary | ICD-10-CM | POA: Diagnosis not present

## 2022-08-04 LAB — CMP (CANCER CENTER ONLY)
ALT: 11 U/L (ref 0–44)
AST: 17 U/L (ref 15–41)
Albumin: 4.3 g/dL (ref 3.5–5.0)
Alkaline Phosphatase: 59 U/L (ref 38–126)
Anion gap: 6 (ref 5–15)
BUN: 18 mg/dL (ref 8–23)
CO2: 33 mmol/L — ABNORMAL HIGH (ref 22–32)
Calcium: 10 mg/dL (ref 8.9–10.3)
Chloride: 100 mmol/L (ref 98–111)
Creatinine: 0.79 mg/dL (ref 0.44–1.00)
GFR, Estimated: >60 mL/min (ref >60)
Glucose, Bld: 114 mg/dL — ABNORMAL HIGH (ref 70–99)
Potassium: 4 mmol/L (ref 3.5–5.1)
Sodium: 139 mmol/L (ref 135–145)
Total Bilirubin: 0.4 mg/dL (ref 0.3–1.2)
Total Protein: 7.3 g/dL (ref 6.5–8.1)

## 2022-08-04 LAB — LACTATE DEHYDROGENASE: LDH: 170 U/L (ref 98–192)

## 2022-08-04 LAB — CBC WITH DIFFERENTIAL (CANCER CENTER ONLY)
Abs Immature Granulocytes: 0.05 10*3/uL (ref 0.00–0.07)
Basophils Absolute: 0.1 10*3/uL (ref 0.0–0.1)
Basophils Relative: 1 %
Eosinophils Absolute: 0.2 10*3/uL (ref 0.0–0.5)
Eosinophils Relative: 2 %
HCT: 36.1 % (ref 36.0–46.0)
Hemoglobin: 11.8 g/dL — ABNORMAL LOW (ref 12.0–15.0)
Immature Granulocytes: 1 %
Lymphocytes Relative: 16 %
Lymphs Abs: 1.4 10*3/uL (ref 0.7–4.0)
MCH: 31.4 pg (ref 26.0–34.0)
MCHC: 32.7 g/dL (ref 30.0–36.0)
MCV: 96 fL (ref 80.0–100.0)
Monocytes Absolute: 0.9 10*3/uL (ref 0.1–1.0)
Monocytes Relative: 11 %
Neutro Abs: 6.1 10*3/uL (ref 1.7–7.7)
Neutrophils Relative %: 69 %
Platelet Count: 196 10*3/uL (ref 150–400)
RBC: 3.76 MIL/uL — ABNORMAL LOW (ref 3.87–5.11)
RDW: 14.6 % (ref 11.5–15.5)
WBC Count: 8.7 10*3/uL (ref 4.0–10.5)
nRBC: 0 % (ref 0.0–0.2)

## 2022-08-04 LAB — VITAMIN D 25 HYDROXY (VIT D DEFICIENCY, FRACTURES): Vit D, 25-Hydroxy: 49.94 ng/mL (ref 30–100)

## 2022-08-04 LAB — HEPATITIS C ANTIBODY: HCV Ab: NONREACTIVE

## 2022-08-04 LAB — HEPATITIS B CORE ANTIBODY, TOTAL: Hep B Core Total Ab: NONREACTIVE

## 2022-08-04 LAB — HIV ANTIBODY (ROUTINE TESTING W REFLEX): HIV Screen 4th Generation wRfx: NONREACTIVE

## 2022-08-04 LAB — HEPATITIS B SURFACE ANTIGEN: Hepatitis B Surface Ag: NONREACTIVE

## 2022-08-05 ENCOUNTER — Telehealth: Payer: Self-pay | Admitting: Hematology

## 2022-08-05 ENCOUNTER — Telehealth: Payer: Self-pay

## 2022-08-05 ENCOUNTER — Other Ambulatory Visit: Payer: Self-pay | Admitting: Hematology

## 2022-08-05 ENCOUNTER — Ambulatory Visit (HOSPITAL_COMMUNITY)
Admission: RE | Admit: 2022-08-05 | Discharge: 2022-08-05 | Disposition: A | Discharge status: Home / Self Care | Payer: Medicare PPO | Source: Ambulatory Visit | Attending: Hematology | Admitting: Hematology

## 2022-08-05 DIAGNOSIS — M7989 Other specified soft tissue disorders: Secondary | ICD-10-CM | POA: Diagnosis not present

## 2022-08-05 MED ORDER — APIXABAN 5 MG PO TABS: 5.0000 mg | ORAL_TABLET | Freq: Two times a day (BID) | ORAL | 2 refills | Status: DC

## 2022-08-05 NOTE — Progress Notes (Signed)
BLE venous duplex has been completed.  Preliminary findings given to Waynetta Sandy, RN at Dr. Clyda Greener office.   Results can be found under chart review under CV PROC. 08/05/2022 10:45 AM Keithon Mccoin RVT, RDMS

## 2022-08-05 NOTE — Telephone Encounter (Signed)
Contacted pt per Dr Candise Che to let pt know her ultrasound showed a left perineal vein DVT. Pt to start taking Eliquis today. Pt instructed to stop Vitamin K and daily ASA. Pt acknowledged information and verbalized understanding.

## 2022-08-09 DIAGNOSIS — S40019D Contusion of unspecified shoulder, subsequent encounter: Secondary | ICD-10-CM | POA: Diagnosis not present

## 2022-08-10 ENCOUNTER — Telehealth: Payer: Self-pay | Admitting: Family Medicine

## 2022-08-10 LAB — SURGICAL PATHOLOGY

## 2022-08-16 DIAGNOSIS — S40019D Contusion of unspecified shoulder, subsequent encounter: Secondary | ICD-10-CM | POA: Diagnosis not present

## 2022-08-23 ENCOUNTER — Encounter: Payer: Self-pay | Admitting: Family Medicine

## 2022-08-29 ENCOUNTER — Encounter: Payer: Self-pay | Admitting: Family Medicine

## 2022-08-29 ENCOUNTER — Encounter (HOSPITAL_COMMUNITY)
Admission: RE | Admit: 2022-08-29 | Discharge: 2022-08-29 | Disposition: A | Discharge status: Home / Self Care | Payer: Medicare PPO | Source: Ambulatory Visit | Attending: Hematology | Admitting: Hematology

## 2022-08-29 DIAGNOSIS — C8294 Follicular lymphoma, unspecified, lymph nodes of axilla and upper limb: Secondary | ICD-10-CM | POA: Diagnosis not present

## 2022-08-29 LAB — GLUCOSE, CAPILLARY: Glucose-Capillary: 106 mg/dL — ABNORMAL HIGH (ref 70–99)

## 2022-08-29 MED ORDER — FLUDEOXYGLUCOSE F - 18 (FDG) INJECTION
8.4000 | Freq: Once | INTRAVENOUS | Status: AC
  Administered 2022-08-29: 8.33 via INTRAVENOUS

## 2022-08-30 DIAGNOSIS — S40019D Contusion of unspecified shoulder, subsequent encounter: Secondary | ICD-10-CM | POA: Diagnosis not present

## 2022-09-02 ENCOUNTER — Telehealth: Payer: Self-pay | Admitting: Hematology

## 2022-09-02 ENCOUNTER — Inpatient Hospital Stay: Payer: Medicare PPO | Attending: Hematology | Admitting: Hematology

## 2022-09-02 ENCOUNTER — Other Ambulatory Visit: Payer: Self-pay

## 2022-09-02 VITALS — BP 139/60 | HR 83 | Temp 97.5°F | Resp 16 | Wt 165.9 lb

## 2022-09-02 DIAGNOSIS — Z7189 Other specified counseling: Secondary | ICD-10-CM | POA: Diagnosis not present

## 2022-09-02 DIAGNOSIS — Z806 Family history of leukemia: Secondary | ICD-10-CM | POA: Diagnosis not present

## 2022-09-02 DIAGNOSIS — Z79899 Other long term (current) drug therapy: Secondary | ICD-10-CM | POA: Insufficient documentation

## 2022-09-02 DIAGNOSIS — Z7901 Long term (current) use of anticoagulants: Secondary | ICD-10-CM | POA: Diagnosis not present

## 2022-09-02 DIAGNOSIS — I10 Essential (primary) hypertension: Secondary | ICD-10-CM | POA: Insufficient documentation

## 2022-09-02 DIAGNOSIS — Z87891 Personal history of nicotine dependence: Secondary | ICD-10-CM | POA: Insufficient documentation

## 2022-09-02 DIAGNOSIS — I82452 Acute embolism and thrombosis of left peroneal vein: Secondary | ICD-10-CM | POA: Insufficient documentation

## 2022-09-02 DIAGNOSIS — C8294 Follicular lymphoma, unspecified, lymph nodes of axilla and upper limb: Secondary | ICD-10-CM | POA: Insufficient documentation

## 2022-09-02 DIAGNOSIS — E7800 Pure hypercholesterolemia, unspecified: Secondary | ICD-10-CM | POA: Diagnosis not present

## 2022-09-02 DIAGNOSIS — Z8673 Personal history of transient ischemic attack (TIA), and cerebral infarction without residual deficits: Secondary | ICD-10-CM | POA: Diagnosis not present

## 2022-09-02 MED ORDER — APIXABAN 5 MG PO TABS: 5.0000 mg | ORAL_TABLET | Freq: Two times a day (BID) | ORAL | 4 refills | Status: DC

## 2022-09-02 NOTE — Progress Notes (Signed)
HEMATOLOGY/ONCOLOGY CLINIC NOTE  Date of Service: 09/02/22  Patient Care Team: Heather Andrews, Heather Roach, MD as PCP - General (Family Medicine) Heather Maine, MD as Consulting Physician (Hematology)  CHIEF COMPLAINTS/PURPOSE OF CONSULTATION:  Evaluation and management of newly-diagnosed follicular lymphoma.  HISTORY OF PRESENTING ILLNESS:   Heather Andrews is a wonderful 82 y.o. female who has been referred to [place] by Heather Belling, MD for evaluation and management of newly-diagnosed low-grade follicular lymphoma.   Today, she is accompanied by her daughter. She reports that she has had right LE edema for about 6 months. Upon examination, patient has bilateral LE edema, right more than left.   She also reports having a rash at one point. Patient denies any medication changes, long distance travel, or unusual activity. Patient denies any redness/pain in the area.  Patient denies back pain, recent infections, abdominal pain, change in bowel habits, fever, chills, or night sweats.  She does report that she tripped and suffered a fall in January 2024, causing her to hit the side of her head.  Patient denies any breast discomfort or swelling besides in her LE. She does however sometimes experience tingling in her hands.  Patient does confirm having a silent stroke at some point previously, but is unsure of when it occurred.   Patient does report that she is "slowing down" in general, which she attributes to her age. Patient denies any sudden new fatigue or other major limiting medical issues at this time. Patient does regualrly take vitamin D, but is unsure of how many units she takes. She also takes vitamin K. Patient denies any concern for varicose veins in the past. Patient reports that she will be traveling to [facility] for a week and will leave May 4th.  INTERVAL HISTORY:  Heather Andrews is a 82 y.o. female here for continued evaluation and management of newly-diagnosed  follicular lymphoma. Patient was last seen by me on 08/04/2022 and reported bilateral LE edema, with greater edema in the right. She also reported having a rash at one time, a fall with contact to her lateral head, hand tingling, and silent stroke.  Today, she is accompanied by her daughter. She reports that her right LE edema has improved. She is tolerating blood thinners well with no major toxicities. Patient regularly takes vitamin D3 4000 IU. She is taking Prolia and is planned to receive a bone density study soon.  She complains of fatigue over the last 1-1.5 years. She does complain of night sweats on a couple occasions.  She reports that her daughter has NF and was diagnosed with scoliosis at age 102 and received consequent surgery.   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   Sensorineural hearing loss (SNHL) of both ears    Patient wears biaural hearing aids   Stress incontinence, female    USI    SURGICAL HISTORY: Past Surgical History:  Procedure Laterality Date   BASAL CELL CARCINOMA EXCISION  CATARACT EXTRACTION     Cyst removed from chest     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"    SOCIAL HISTORY: Social History   Socioeconomic History   Marital status: Widowed    Spouse name: Not on file   Number of children: 3   Years of education: 1   Highest education level: Not on file  Occupational History   Occupation: Tourist information centre manager    Comment: Retired  Tobacco Use   Smoking  status: Former    Packs/day: 0.50    Years: 45.00    Additional pack years: 0.00    Total pack years: 22.50    Types: Cigarettes    Start date: 04/1956   Smokeless tobacco: Never  Vaping Use   Vaping Use: Never used  Substance and Sexual Activity   Alcohol use: Yes    Comment: occassionally   Drug use: No   Sexual activity: Never    Comment: 1st intercourse 82 yo-Fewer than 5 partners  Other Topics Concern   Not on file  Social History Narrative   Widowed, lives alone though a daughter lives next door.   3 children: Heather Andrews; Heather Andrews, Heather Andrews   Activities: reading, interacting with her two cats, playing cards, corresponding with others (email, snail mail), needlework, cooking   Patient indicated on Geri Assessment questionnaire that she would want her daughter, Heather Andrews to make medical decisions on her behalf should she be unable. [phone]).  The patient gives permission to speak to Heather Andrews about issue around the patient's health.      Patient lives in a townhouse.  13 steps to get into home.  Home has two levels.    Assists Devices in home: Grab Bars   Regular exercise: Yoga once a week   Transportation: Patient drives   Social Determinants of Health   Financial Resource Strain: Low Risk  (03/19/2021)   Overall Financial Resource Strain (CARDIA)    Difficulty of Paying Living Expenses: Not hard at all  Food Insecurity: No Food Insecurity (08/04/2022)   Hunger Vital Sign    Worried About Running Out of Food in the Last Year: Never true    Ran Out of Food in the Last Year: Never true  Transportation Needs: No Transportation Needs (08/04/2022)   PRAPARE - Administrator, Civil Service (Medical): No    Lack of Transportation (Non-Medical): No  Physical Activity: Unknown (03/19/2021)   Exercise Vital Sign    Days of Exercise per Week: 0 days    Minutes of Exercise per Session: Not on file  Stress: Not on file  Social Connections: Moderately  Integrated (03/19/2021)   Social Connection and Isolation Panel [NHANES]    Frequency of Communication with Friends and Family: More than three times a week    Frequency of Social Gatherings with Friends and Family: More than three times a week    Attends Religious Services: More than 4 times per year    Active Member of [employer] or Organizations: Yes    Attends Banker Meetings: More than 4 times per year    Marital Status: Widowed  Intimate Partner Violence: Not At Risk (08/04/2022)   Humiliation, Afraid, Rape, and Kick questionnaire    Fear of Current or Ex-Partner: No    Emotionally Abused: No    Physically Abused: No    Sexually Abused: No    FAMILY HISTORY: Family History  Problem Relation Age of  Onset   Hypertension Mother    Heart disease Paternal Grandmother    Heart disease Paternal Grandfather    Cancer Brother        Leukemia    ALLERGIES:  is allergic to lisinopril and ephedrine-guaifenesin.  MEDICATIONS:  Current Outpatient Medications  Medication Sig Dispense Refill   albuterol (VENTOLIN HFA) 108 (90 Base) MCG/ACT inhaler Inhale 2 puffs into the lungs every 6 (six) hours as needed for wheezing or shortness of breath. 18 g 5   amLODipine (NORVASC) 5 MG tablet 1 tablet. (Patient not taking: Reported on 08/04/2022)     apixaban (ELIQUIS) 5 MG TABS tablet Take 1 tablet (5 mg total) by mouth 2 (two) times daily. 60 tablet 2   calcium carbonate (OSCAL) 1500 (600 Ca) MG TABS tablet Take 600 mg by mouth 2 (two) times daily.     Cholecalciferol (VITAMIN D3) 125 MCG (5000 UT) CAPS Take 125 mcg by mouth daily.     Cyanocobalamin (VITAMIN B-12 PO) Take 1 tablet by mouth daily.     denosumab (PROLIA) 60 MG/ML SOSY injection Inject 60 mg into the skin every 6 (six) months.     GEMTESA 75 MG TABS Take 1 tablet by mouth daily.     hydrochlorothiazide (HYDRODIURIL) 25 MG tablet Take 1 tablet (25 mg total) by mouth daily. 90 tablet 3   rosuvastatin (CRESTOR) 20 MG tablet  Take 1 tablet (20 mg total) by mouth daily. 90 tablet 1   Tiotropium Bromide-Olodaterol (STIOLTO RESPIMAT) 2.5-2.5 MCG/ACT AERS INHALE 2 PUFFS BY MOUTH INTO THE LUNGS DAILY 4 g 11   tiZANidine HCl (ZANAFLEX PO)  (Patient not taking: Reported on 08/04/2022)     triamcinolone cream (KENALOG) 0.1 % Apply 1 Application topically 2 (two) times daily. Rub into right leg redness once a day until redness resolves. (Patient not taking: Reported on 08/04/2022) 45 g 0   No current facility-administered medications for this visit.    REVIEW OF SYSTEMS:    10 Point review of Systems was done is negative except as noted above.   PHYSICAL EXAMINATION: ECOG PERFORMANCE STATUS: 1 - Symptomatic but completely ambulatory  . Vitals:   09/02/22 1154  BP: 139/60  Pulse: 83  Resp: 16  Temp: (!) 97.5 F (36.4 C)  SpO2: 94%    Filed Weights   09/02/22 1154  Weight: 165 lb 14.4 oz (75.3 kg)    .Body mass index is 32.4 kg/m.   GENERAL:alert, in no acute distress and comfortable SKIN: no acute rashes, no significant lesions EYES: conjunctiva are pink and non-injected, sclera anicteric OROPHARYNX: MMM, no exudates, no oropharyngeal erythema or ulceration NECK: supple, no JVD LYMPH:  no palpable lymphadenopathy in the cervical, axillary regions LUNGS: clear to auscultation b/l with normal respiratory effort HEART: regular rate & rhythm ABDOMEN:  normoactive bowel sounds , non tender, not distended. Extremity: no pedal edema PSYCH: alert & oriented x 3 with fluent speech NEURO: no focal motor/sensory deficits   LABORATORY DATA:  I have reviewed the data as listed .    Latest Ref Rng & Units 08/04/2022   10:06 AM 02/03/2022    4:53 PM 05/10/2021    8:03 AM  CBC  WBC 4.0 - 10.5 K/uL 8.7  10.2  12.3   Hemoglobin 12.0 - 15.0 g/dL 16.1  09.6  04.5   Hematocrit 36.0 - 46.0 % 36.1  36.6  40.0   Platelets 150 - 400 K/uL 196  239  251    .  Latest Ref Rng & Units 08/04/2022   10:06 AM  02/03/2022    4:53 PM 05/10/2021    7:24 AM  CMP  Glucose 70 - 99 mg/dL 161  096  045   BUN 8 - 23 mg/dL 18  21  13    Creatinine 0.44 - 1.00 mg/dL 4.09  8.11  9.14   Sodium 135 - 145 mmol/L 139  141  134   Potassium 3.5 - 5.1 mmol/L 4.0  4.5  3.7   Chloride 98 - 111 mmol/L 100  99  99   CO2 22 - 32 mmol/L 33  27  25   Calcium 8.9 - 10.3 mg/dL 78.2  9.1  8.6   Total Protein 6.5 - 8.1 g/dL 7.3  7.0  7.2   Total Bilirubin 0.3 - 1.2 mg/dL 0.4  0.2  0.7   Alkaline Phos 38 - 126 U/L 59  60  42   AST 15 - 41 U/L 17  22  18    ALT 0 - 44 U/L 11  11  13     . Lab Results  Component Value Date   LDH 170 08/04/2022   Component     Latest Ref Rng 08/04/2022  Vitamin D, 25-Hydroxy     30 - 100 ng/mL 49.94   HIV Screen 4th Generation wRfx     Non Reactive  Non Reactive   Hepatitis B Surface Ag     NON REACTIVE  NON REACTIVE   Hep B Core Total Ab     NON REACTIVE  NON REACTIVE   HCV Ab     NON REACTIVE  NON REACTIVE      Needle core biopsy 07/27/2022:   Breast Ultrasound 07/20/2022:  Ultrasound guided biopsy:  Ultrasound guided biopsy:       RADIOGRAPHIC STUDIES: I have personally reviewed the radiological images as listed and agreed with the findings in the report. NM PET Image Initial (PI) Skull Base To Thigh  Result Date: 08/31/2022 CLINICAL DATA:  Initial treatment strategy for follicular lymphoma. EXAM: NUCLEAR MEDICINE PET SKULL BASE TO THIGH TECHNIQUE: 8.33 mCi F-18 FDG was injected intravenously. Full-ring PET imaging was performed from the skull base to thigh after the radiotracer. CT data was obtained and used for attenuation correction and anatomic localization. Fasting blood glucose: 106 mg/dl COMPARISON:  Abdominopelvic CT 05/10/2021. FINDINGS: Mediastinal blood pool activity: SUV max 2.4 Liver activity: SUV max 3.3 NECK: There are numerous mildly enlarged cervical lymph nodes bilaterally with low level hypermetabolic activity. There is prominent hypermetabolic activity  within the lymphoid tissue of Waldeyer's ring (SUV max 8.4), nonspecific and potentially physiologic. Incidental CT findings: Bilateral carotid atherosclerosis. CHEST: There are multiple mildly to moderately enlarged mediastinal, hilar and axillary lymph nodes bilaterally with low to intermediate level metabolic activity. Representative nodes include a lateral AP window node measuring 2.2 cm on image 61/4 (SUV max 5.0), a right axillary node measuring 1.2 cm on image 55/4 (SUV max 4.3) and a left axillary node measuring 1.7 cm on image 57/4 (SUV max 4.7). No hypermetabolic pulmonary activity or suspicious nodularity. Incidental CT findings: Along the posterior aspect of the right thyroid lobe is a nodule measuring 2.9 x 2.0 cm on image 42/4. This demonstrates no hypermetabolic activity. This could reflect a thyroid nodule or a lymph node without hypermetabolic activity. Atherosclerosis of the aorta, great vessels and coronary arteries. ABDOMEN/PELVIS: There is no hypermetabolic activity within the liver, adrenal glands, spleen or pancreas. There are multiple hypermetabolic retroperitoneal, mesenteric,  pelvic and inguinal lymph nodes bilaterally. Representative lesions include a right common iliac nodal mass measuring 3.3 x 2.7 cm on image 143/4 (SUV max 5.7), pelvic sidewall nodes measuring 2.3 cm short axis on the right on image 159/4 (SUV max 5.5) and 2.3 cm on the left, same image, SUV max 5.7. 5.0 x 2.2 cm right inguinal node on image 169/4 has an SUV max of 5.5. Bowel activity within physiologic limits. Incidental CT findings: Diffuse aortic and branch vessel atherosclerosis without evidence of aneurysm. Moderate diverticular changes in the distal colon. No evidence of bowel or ureteral obstruction. SKELETON: There is no hypermetabolic activity to suggest osseous metastatic disease. Incidental CT findings: Moderate lumbar spondylosis. IMPRESSION: 1. Widespread hypermetabolic lymphadenopathy throughout the neck,  chest, abdomen and pelvis consistent with known lymphoma ([place] 4/5). 2. No evidence of solid organ, osseous or pulmonary involvement. 3. Indeterminate hypermetabolic activity within the lymphoid tissue of Waldeyer's ring. This could be physiologic, although lymphomatous involvement cannot be excluded. 4. 2.9 cm nodule along the posterior aspect of the right thyroid lobe without hypermetabolic activity, potentially a thyroid nodule or a lymph node without hypermetabolic activity. Suggest attention on follow-up CT. If persistent after improvement in adenopathy, this could be further evaluated with thyroid ultrasound. 5.  Aortic Atherosclerosis (ICD10-I70.0). Electronically Signed   By: Carey Bullocks M.D.   On: 08/31/2022 12:15   VAS [place] LOWER EXTREMITY VENOUS (DVT)  Result Date: 08/05/2022  Lower Venous DVT Study Patient Name:  SAMAURA CLOWDUS [facility]  Date of Exam:   08/05/2022 Medical Rec #: [id number]        Accession #:    [id number] Date of Birth: [birth year]         Patient Gender: F Patient Age:   43 years Exam Location:  [facility] Procedure:      VAS [place] LOWER EXTREMITY VENOUS (DVT) Referring Phys: Wyvonnia Lora --------------------------------------------------------------------------------  Indications: Swelling R > L.  Risk Factors: Lymphoma (recently diagnosed). Comparison Study: Previous RLEV on 02/04/22 was negative for DVT Performing Technologist: Ernestene Mention RVT, RDMS  Examination Guidelines: A complete evaluation includes B-mode imaging, spectral Doppler, color Doppler, and power Doppler as needed of all accessible portions of each vessel. Bilateral testing is considered an integral part of a complete examination. Limited examinations for reoccurring indications may be performed as noted. The reflux portion of the exam is performed with the patient in reverse Trendelenburg.  +---------+---------------+---------+-----------+----------+--------------+ RIGHT     CompressibilityPhasicitySpontaneityPropertiesThrombus Aging +---------+---------------+---------+-----------+----------+--------------+ CFV      Full           Yes      Yes                                 +---------+---------------+---------+-----------+----------+--------------+ SFJ      Full                                                        +---------+---------------+---------+-----------+----------+--------------+ FV Prox  Full           Yes      Yes                                 +---------+---------------+---------+-----------+----------+--------------+ FV Mid  Full           Yes      Yes                                 +---------+---------------+---------+-----------+----------+--------------+ FV DistalFull           Yes      Yes                                 +---------+---------------+---------+-----------+----------+--------------+ PFV      Full                                                        +---------+---------------+---------+-----------+----------+--------------+ POP      Full           Yes      Yes                                 +---------+---------------+---------+-----------+----------+--------------+ PTV      Full                                                        +---------+---------------+---------+-----------+----------+--------------+ PERO     Full                                                        +---------+---------------+---------+-----------+----------+--------------+   +---------------+---------------+---------+-----------+----------+------------+ LEFT           CompressibilityPhasicitySpontaneityPropertiesThrombus                                                                 Aging        +---------------+---------------+---------+-----------+----------+------------+ CFV            Full           Yes      Yes                                +---------------+---------------+---------+-----------+----------+------------+ SFJ            Full                                                      +---------------+---------------+---------+-----------+----------+------------+ FV Prox        Full           Yes      Yes                               +---------------+---------------+---------+-----------+----------+------------+  FV Mid         Full           Yes      Yes                               +---------------+---------------+---------+-----------+----------+------------+ FV Distal      Full           Yes      Yes                               +---------------+---------------+---------+-----------+----------+------------+ PFV            Full                                                      +---------------+---------------+---------+-----------+----------+------------+ POP            Full           Yes      Yes                               +---------------+---------------+---------+-----------+----------+------------+ PTV            Full                                                      +---------------+---------------+---------+-----------+----------+------------+ PERO           None           No       No                   Acute        +---------------+---------------+---------+-----------+----------+------------+ Perforator VeinNone           No       No                   Acute        +---------------+---------------+---------+-----------+----------+------------+     Summary: BILATERAL: -No evidence of popliteal cyst, bilaterally. RIGHT: - There is no evidence of deep vein thrombosis in the lower extremity.  - Subcutaneous edema in area of calf and ankle. - Ultrasound characteristics of enlarged lymph nodes are noted in the groin.  LEFT: - Findings consistent with acute deep vein thrombosis involving the left peroneal veins. - Findings consistent with acute superficial vein thrombosis  involving the left varicosities or other superficial veins. - Ultrasound characteristics of enlarged lymph nodes noted in the groin.  *See table(s) above for measurements and observations. Electronically signed by Sherald Hess MD on 08/05/2022 at 11:59:38 AM.    Final     ASSESSMENT & PLAN:   Wonderful 82 y.o. female with:  Newly-diagnosed low grade follicular lymphoma incidentally noted LNadenopathy on routine mammogram. History of asymptomatic stroke  Hypertension  Pure hypercholesterolemia  Contusion of right shoulder region  6. Acute DVT of left peroneal veins  PLAN:   -Discussed lab results from 08/04/2022 in detail with patient. CBC normal, showed WBC of 8.7K, hemoglobin of 11.8, and  platelets of 196K. -no concern based on blood counts -CMP stable, normal electrolytes, normal kidney/liver function  -LDH normal -HIV and hepatitis testing negative -discussed results of [place] which showed lymph nodes in groin  -discussed results of 08/29/2022 PET scan which showed widespread hypermetabolic lymphadenopathy throughout the neck, chest, abdomen and pelvis consistent with known lymphoma. No solid organ, bone or lung involvement. Numerous small ymph nodes in neck,  lymph nodes present in central chest and axillary, large lymph nodes present in abdm, lymph node in right pelvis and groin is largest at about 5.5 cm in size -lymph nodes in right pelvis and groin likely causing LE swelling -no enlarged spleen -incidental nodule 2.9 x 2.0 cm found in thyroid -SUV max 3.3 in liver -patient has grade 1 stage 3 disease based on areas of involvement of disease, discussed details of staging of disease.  -discussed proceeding options: If patient is asymptomatic, she may choose to monitor and watch Patient's bulky disease blocking lymphatics and LE edema meets criteria to consider treatment Only antibody treatment with Rituxan without chemotherapy, which would be recommended. Discussed details of once  a week treatment with 4 doses. Discussed details of maintenance regimen. Discussed risk of allergic reaction and pre-medication options for prevention. Discussed that this may increase the risk of infection. May rarely affect blood counts.  Local radiation Combination immunotherapy/chemotherapy treatment which would involve increased risk of side effects. -will start Rituxan x4 doses -will plan for education session to discuss details of Rituxan treatment -continue Eliquis 5 mg twice daily for left peroneal DVT -continue to take vitamin D3 4000 IU regularly to boost immune system -no reason for additional biopsy at this time -will treat blood clot in legs for 6 months then treat with blood thinners. May then start baby Aspirin for prevention. -Recommend patient to use compression socks to prevent blood from pooling in the legs and reduce the risk of blood clots -Patient is considering traveling to [facility] in September 2024 -answered all of patient's and her daughter's questions in detail -informed patient that there may be some connection with NF and certain types of tumor, though further evaluation would be necessary for further understanding   FOLLOW-UP: Chemo-counseling for Rituxan Rituxan weekly x 4 doses  The total time spent in the appointment was 41 minutes* .  All of the patient's questions were answered with apparent satisfaction. The patient knows to call the clinic with any problems, questions or concerns.   Wyvonnia Lora MD Heather AAHIVMS [facility] Hematology/Oncology Physician [facility]  .*Total Encounter Time as defined by the Centers for Medicare and Medicaid Services includes, in addition to the face-to-face time of a patient visit (documented in the note above) non-face-to-face time: obtaining and reviewing outside history, ordering and reviewing medications, tests or procedures, care coordination (communications with other health care professionals or  caregivers) and documentation in the medical record.    I,Mitra Faeizi,acting as a Neurosurgeon for Wyvonnia Lora, MD.,have documented all relevant documentation on the behalf of Wyvonnia Lora, MD,as directed by  Wyvonnia Lora, MD while in the presence of Wyvonnia Lora, MD.  .I have reviewed the above documentation for accuracy and completeness, and I agree with the above. Heather Maine MD

## 2022-09-08 ENCOUNTER — Other Ambulatory Visit: Payer: Medicare PPO

## 2022-09-08 ENCOUNTER — Encounter: Payer: Self-pay | Admitting: Hematology

## 2022-09-08 ENCOUNTER — Ambulatory Visit: Payer: Medicare PPO | Admitting: Family Medicine

## 2022-09-08 ENCOUNTER — Encounter: Payer: Self-pay | Admitting: Family Medicine

## 2022-09-08 VITALS — BP 135/54 | HR 67 | Ht 60.0 in | Wt 163.6 lb

## 2022-09-08 DIAGNOSIS — I824Z2 Acute embolism and thrombosis of unspecified deep veins of left distal lower extremity: Secondary | ICD-10-CM

## 2022-09-08 DIAGNOSIS — J4489 Other specified chronic obstructive pulmonary disease: Secondary | ICD-10-CM | POA: Diagnosis not present

## 2022-09-08 DIAGNOSIS — R599 Enlarged lymph nodes, unspecified: Secondary | ICD-10-CM | POA: Diagnosis not present

## 2022-09-08 DIAGNOSIS — I10 Essential (primary) hypertension: Secondary | ICD-10-CM

## 2022-09-08 DIAGNOSIS — Z7189 Other specified counseling: Secondary | ICD-10-CM | POA: Insufficient documentation

## 2022-09-08 DIAGNOSIS — C8294 Follicular lymphoma, unspecified, lymph nodes of axilla and upper limb: Secondary | ICD-10-CM | POA: Insufficient documentation

## 2022-09-08 HISTORY — DX: Acute embolism and thrombosis of unspecified deep veins of left distal lower extremity: I82.4Z2

## 2022-09-08 NOTE — Progress Notes (Signed)
Heather Andrews is alone Sources of clinical information for visit is/are patient. Nursing assessment for this office visit was reviewed with the patient for accuracy and revision.     Previous Report(s) Reviewed: Dr Candise Che (Onc) notes, Breast biopsy pathology,      09/08/2022    9:49 AM  Depression screen PHQ 2/9  Decreased Interest 0  Down, Depressed, Hopeless 0  PHQ - 2 Score 0  Altered sleeping 0  Tired, decreased energy 3  Change in appetite 1  Feeling bad or failure about yourself  0  Trouble concentrating 0  Moving slowly or fidgety/restless 0  Suicidal thoughts 0  PHQ-9 Score 4  Difficult doing work/chores Not difficult at all   [facility] Office Visit from 09/08/2022 in [facility] Office Visit from 06/09/2022 in [facility] Office Visit from 05/06/2022 in [facility]  Thoughts that you would be better off dead, or of hurting yourself in some way Not at all Not at all Not at all  PHQ-9 Total Score 4 1 3           09/08/2022    9:49 AM 06/09/2022    9:39 AM 05/06/2022    3:10 PM 02/03/2022    3:28 PM 10/07/2021    8:57 AM  Fall Risk   Falls in the past year? 0 1 1 0 0  Number falls in past yr: 0 0 0 0 0  Injury with Fall? 0 1 1 0 0       09/08/2022    9:49 AM 08/04/2022    9:52 AM 06/09/2022    9:39 AM  PHQ9 SCORE ONLY  PHQ-9 Total Score 4 0 1    There are no preventive care reminders to display for this patient.  Health Maintenance Due  Topic Date Due   Medicare Annual Wellness (AWV)  Never done   Zoster Vaccines- Shingrix (2 of 2) 11/12/2021   COVID-19 Vaccine (5 - 2023-24 season) 12/17/2021      History/P.E. limitations: none  There are no preventive care reminders to display for this patient. There are no preventive care reminders to display for this patient.  Health Maintenance Due  Topic Date Due   Medicare Annual Wellness (AWV)  Never done   Zoster Vaccines- Shingrix (2 of 2)  11/12/2021   COVID-19 Vaccine (5 - 2023-24 season) 12/17/2021     Chief Complaint  Patient presents with   Hypertension    --------------------------------------------------------------------------------------------------------------------------------------------- CPT E&M Office Visit Time Before Visit; reviewing medical records (e.g. recent visits, labs, studies): 10 minutes During Visit (F2F time): 15 minutes After Visit (discussion with family or HCP, prescribing, ordering, referring, calling result/recommendations or documenting on same day): 5 minutes Total Visit Time: 30 minutes  Level 2 Est: 10-19 min     New: 15-29 min Level 3 Est: 20-29 min     New: 30-44 min Level 4 Est: 30-39 min     New: 45-59 min Level 5 Est: 40-54 min     New: 60-74 min  Visit Problem List with A/P  Essential hypertension Established problem Well Controlled. Patient is at goal of <130/80. No signs of complications, medication side effects, or red flags. Continue HCTZ 25 mg daily RTC 4 months for monitoring   COPD with asthma Established problem Well Controlled. Patient is at goal of respiratory symptom control. She recently vacationed at [place] and was able to tour and shop without difficulty.  No signs of  complications, medication side effects, or red flags. Continue Stiolto Respimat daily

## 2022-09-08 NOTE — Assessment & Plan Note (Signed)
Established problem Well Controlled. Patient is at goal of <130/80. No signs of complications, medication side effects, or red flags. Continue HCTZ 25 mg daily RTC 4 months for monitoring

## 2022-09-08 NOTE — Patient Instructions (Addendum)
I was so good to see you.  Your attitude is something others, including myself, should strive for.   Your blood pressure is under good control.  Keep taking your HCTZ.   I would like to see you back in 4 months to follow up your high blood pressure.

## 2022-09-08 NOTE — Assessment & Plan Note (Signed)
Established problem Well Controlled. Patient is at goal of respiratory symptom control. She recently vacationed at [place] and was able to tour and shop without difficulty.  No signs of complications, medication side effects, or red flags. Continue Stiolto Respimat daily

## 2022-09-09 ENCOUNTER — Other Ambulatory Visit: Payer: Self-pay

## 2022-09-14 ENCOUNTER — Telehealth: Payer: Self-pay | Admitting: Hematology

## 2022-09-14 ENCOUNTER — Inpatient Hospital Stay: Payer: Medicare PPO

## 2022-09-14 DIAGNOSIS — M8588 Other specified disorders of bone density and structure, other site: Secondary | ICD-10-CM | POA: Diagnosis not present

## 2022-09-15 ENCOUNTER — Other Ambulatory Visit: Payer: Self-pay

## 2022-09-17 ENCOUNTER — Other Ambulatory Visit: Payer: Self-pay

## 2022-09-19 ENCOUNTER — Other Ambulatory Visit: Payer: Self-pay

## 2022-09-19 DIAGNOSIS — C8294 Follicular lymphoma, unspecified, lymph nodes of axilla and upper limb: Secondary | ICD-10-CM

## 2022-09-20 ENCOUNTER — Inpatient Hospital Stay: Payer: Medicare PPO | Attending: Hematology

## 2022-09-20 ENCOUNTER — Other Ambulatory Visit: Payer: Self-pay

## 2022-09-20 ENCOUNTER — Inpatient Hospital Stay: Payer: Medicare PPO

## 2022-09-20 VITALS — BP 145/78 | HR 96 | Temp 97.8°F | Resp 18 | Wt 165.0 lb

## 2022-09-20 DIAGNOSIS — Z79899 Other long term (current) drug therapy: Secondary | ICD-10-CM | POA: Insufficient documentation

## 2022-09-20 DIAGNOSIS — C8294 Follicular lymphoma, unspecified, lymph nodes of axilla and upper limb: Secondary | ICD-10-CM | POA: Diagnosis present

## 2022-09-20 DIAGNOSIS — Z5112 Encounter for antineoplastic immunotherapy: Secondary | ICD-10-CM | POA: Insufficient documentation

## 2022-09-20 DIAGNOSIS — Z7189 Other specified counseling: Secondary | ICD-10-CM

## 2022-09-20 LAB — CMP (CANCER CENTER ONLY)
ALT: 11 U/L (ref 0–44)
AST: 17 U/L (ref 15–41)
Albumin: 4.1 g/dL (ref 3.5–5.0)
Alkaline Phosphatase: 49 U/L (ref 38–126)
Anion gap: 8 (ref 5–15)
BUN: 24 mg/dL — ABNORMAL HIGH (ref 8–23)
CO2: 28 mmol/L (ref 22–32)
Calcium: 9.6 mg/dL (ref 8.9–10.3)
Chloride: 104 mmol/L (ref 98–111)
Creatinine: 0.77 mg/dL (ref 0.44–1.00)
GFR, Estimated: >60 mL/min (ref >60)
Glucose, Bld: 122 mg/dL — ABNORMAL HIGH (ref 70–99)
Potassium: 3.9 mmol/L (ref 3.5–5.1)
Sodium: 140 mmol/L (ref 135–145)
Total Bilirubin: 0.4 mg/dL (ref 0.3–1.2)
Total Protein: 6.9 g/dL (ref 6.5–8.1)

## 2022-09-20 LAB — CBC WITH DIFFERENTIAL (CANCER CENTER ONLY)
Abs Immature Granulocytes: 0.03 10*3/uL (ref 0.00–0.07)
Basophils Absolute: 0.1 10*3/uL (ref 0.0–0.1)
Basophils Relative: 1 %
Eosinophils Absolute: 0.2 10*3/uL (ref 0.0–0.5)
Eosinophils Relative: 3 %
HCT: 34.9 % — ABNORMAL LOW (ref 36.0–46.0)
Hemoglobin: 11.8 g/dL — ABNORMAL LOW (ref 12.0–15.0)
Immature Granulocytes: 0 %
Lymphocytes Relative: 18 %
Lymphs Abs: 1.3 10*3/uL (ref 0.7–4.0)
MCH: 31.9 pg (ref 26.0–34.0)
MCHC: 33.8 g/dL (ref 30.0–36.0)
MCV: 94.3 fL (ref 80.0–100.0)
Monocytes Absolute: 0.9 10*3/uL (ref 0.1–1.0)
Monocytes Relative: 12 %
Neutro Abs: 4.7 10*3/uL (ref 1.7–7.7)
Neutrophils Relative %: 66 %
Platelet Count: 176 10*3/uL (ref 150–400)
RBC: 3.7 MIL/uL — ABNORMAL LOW (ref 3.87–5.11)
RDW: 14.2 % (ref 11.5–15.5)
WBC Count: 7.2 10*3/uL (ref 4.0–10.5)
nRBC: 0 % (ref 0.0–0.2)

## 2022-09-20 LAB — LACTATE DEHYDROGENASE: LDH: 175 U/L (ref 98–192)

## 2022-09-20 MED ORDER — MONTELUKAST SODIUM 10 MG PO TABS
10.0000 mg | ORAL_TABLET | Freq: Once | ORAL | Status: AC
  Administered 2022-09-20: 10 mg via ORAL
  Filled 2022-09-20: qty 1

## 2022-09-20 MED ORDER — SODIUM CHLORIDE 0.9 % IV SOLN
375.0000 mg/m2 | Freq: Once | INTRAVENOUS | Status: AC
  Administered 2022-09-20: 700 mg via INTRAVENOUS
  Filled 2022-09-20: qty 50

## 2022-09-20 MED ORDER — DIPHENHYDRAMINE HCL 50 MG/ML IJ SOLN
50.0000 mg | Freq: Once | INTRAMUSCULAR | Status: AC | PRN
  Administered 2022-09-20: 25 mg via INTRAVENOUS

## 2022-09-20 MED ORDER — SODIUM CHLORIDE 0.9 % IV SOLN: Freq: Once | INTRAVENOUS | Status: DC | PRN

## 2022-09-20 MED ORDER — SODIUM CHLORIDE 0.9 % IV SOLN: Freq: Once | INTRAVENOUS | Status: AC

## 2022-09-20 MED ORDER — ACETAMINOPHEN 500 MG PO TABS
1000.0000 mg | ORAL_TABLET | Freq: Once | ORAL | Status: AC
  Administered 2022-09-20: 1000 mg via ORAL
  Filled 2022-09-20: qty 2

## 2022-09-20 MED ORDER — METHYLPREDNISOLONE SODIUM SUCC 125 MG IJ SOLR
125.0000 mg | Freq: Once | INTRAMUSCULAR | Status: AC
  Administered 2022-09-20: 125 mg via INTRAVENOUS
  Filled 2022-09-20: qty 2

## 2022-09-20 MED ORDER — FAMOTIDINE IN NACL 20-0.9 MG/50ML-% IV SOLN
20.0000 mg | Freq: Once | INTRAVENOUS | Status: AC
  Administered 2022-09-20: 20 mg via INTRAVENOUS
  Filled 2022-09-20: qty 50

## 2022-09-20 MED ORDER — DIPHENHYDRAMINE HCL 25 MG PO CAPS
50.0000 mg | ORAL_CAPSULE | Freq: Once | ORAL | Status: AC
  Administered 2022-09-20: 50 mg via ORAL
  Filled 2022-09-20: qty 2

## 2022-09-20 NOTE — Patient Instructions (Signed)
[facility]  Discharge Instructions: Thank you for choosing [facility] to provide your oncology and hematology care.   If you have a lab appointment with the Cancer Center, please go directly to the Cancer Center and check in at the registration area.   Wear comfortable clothing and clothing appropriate for easy access to any Portacath or PICC line.   We strive to give you quality time with your provider. You may need to reschedule your appointment if you arrive late (15 or more minutes).  Arriving late affects you and other patients whose appointments are after yours.  Also, if you miss three or more appointments without notifying the office, you may be dismissed from the clinic at the provider's discretion.      For prescription refill requests, have your pharmacy contact our office and allow 72 hours for refills to be completed.    Today you received the following chemotherapy and/or immunotherapy agent: Rituximab   To help prevent nausea and vomiting after your treatment, we encourage you to take your nausea medication as directed.  BELOW ARE SYMPTOMS THAT SHOULD BE REPORTED IMMEDIATELY: *FEVER GREATER THAN 100.4 F (38 C) OR HIGHER *CHILLS OR SWEATING *NAUSEA AND VOMITING THAT IS NOT CONTROLLED WITH YOUR NAUSEA MEDICATION *UNUSUAL SHORTNESS OF BREATH *UNUSUAL BRUISING OR BLEEDING *URINARY PROBLEMS (pain or burning when urinating, or frequent urination) *BOWEL PROBLEMS (unusual diarrhea, constipation, pain near the anus) TENDERNESS IN MOUTH AND THROAT WITH OR WITHOUT PRESENCE OF ULCERS (sore throat, sores in mouth, or a toothache) UNUSUAL RASH, SWELLING OR PAIN  UNUSUAL VAGINAL DISCHARGE OR ITCHING   Items with * indicate a potential emergency and should be followed up as soon as possible or go to the Emergency Department if any problems should occur.  Please show the CHEMOTHERAPY ALERT CARD or IMMUNOTHERAPY ALERT CARD at check-in  to the Emergency Department and triage nurse.  Should you have questions after your visit or need to cancel or reschedule your appointment, please contact [facility]  Dept: [phone]  and follow the prompts.  Office hours are 8:00 a.m. to 4:30 p.m. Monday - Friday. Please note that voicemails left after 4:00 p.m. may not be returned until the following business day.  We are closed weekends and major holidays. You have access to a nurse at all times for urgent questions. Please call the main number to the clinic Dept: [phone] and follow the prompts.   For any non-urgent questions, you may also contact your provider using MyChart. We now offer e-Visits for anyone 109 and older to request care online for non-urgent symptoms. For details visit [email or web address].   Also download the MyChart app! Go to the app store, search "MyChart", open the app, select [facility], and log in with your MyChart username and password.  Rituximab Injection What is this medication? RITUXIMAB (ri TUX i mab) treats leukemia and lymphoma. It works by blocking a protein that causes cancer cells to grow and multiply. This helps to slow or stop the spread of cancer cells. It may also be used to treat autoimmune conditions, such as arthritis. It works by slowing down an overactive immune system. It is a monoclonal antibody. This medicine may be used for other purposes; ask your health care provider or pharmacist if you have questions. COMMON BRAND NAME(S): RIABNI, Rituxan, RUXIENCE, truxima What should I tell my care team before I take this medication? They need to know if  you have any of these conditions: Chest pain Heart disease Immune system problems Infection, such as chickenpox, cold sores, hepatitis B, herpes Irregular heartbeat or rhythm Kidney disease Low blood counts, such as low white cells, platelets, red cells Lung disease Recent or upcoming vaccine An unusual  or allergic reaction to rituximab, other medications, foods, dyes, or preservatives Pregnant or trying to get pregnant Breast-feeding How should I use this medication? This medication is injected into a vein. It is given by a care team in a hospital or clinic setting. A special MedGuide will be given to you before each treatment. Be sure to read this information carefully each time. Talk to your care team about the use of this medication in children. While this medication may be prescribed for children as young as 6 months for selected conditions, precautions do apply. Overdosage: If you think you have taken too much of this medicine contact a poison control center or emergency room at once. NOTE: This medicine is only for you. Do not share this medicine with others. What if I miss a dose? Keep appointments for follow-up doses. It is important not to miss your dose. Call your care team if you are unable to keep an appointment. What may interact with this medication? Do not take this medication with any of the following: Live vaccines This medication may also interact with the following: Cisplatin This list may not describe all possible interactions. Give your health care provider a list of all the medicines, herbs, non-prescription drugs, or dietary supplements you use. Also tell them if you smoke, drink alcohol, or use illegal drugs. Some items may interact with your medicine. What should I watch for while using this medication? Your condition will be monitored carefully while you are receiving this medication. You may need blood work while taking this medication. This medication can cause serious infusion reactions. To reduce the risk your care team may give you other medications to take before receiving this one. Be sure to follow the directions from your care team. This medication may increase your risk of getting an infection. Call your care team for advice if you get a fever, chills, sore  throat, or other symptoms of a cold or flu. Do not treat yourself. Try to avoid being around people who are sick. Call your care team if you are around anyone with measles, chickenpox, or if you develop sores or blisters that do not heal properly. Avoid taking medications that contain aspirin, acetaminophen, ibuprofen, naproxen, or ketoprofen unless instructed by your care team. These medications may hide a fever. This medication may cause serious skin reactions. They can happen weeks to months after starting the medication. Contact your care team right away if you notice fevers or flu-like symptoms with a rash. The rash may be red or purple and then turn into blisters or peeling of the skin. You may also notice a red rash with swelling of the face, lips, or lymph nodes in your neck or under your arms. In some patients, this medication may cause a serious brain infection that may cause death. If you have any problems seeing, thinking, speaking, walking, or standing, tell your care team right away. If you cannot reach your care team, urgently seek another source of medical care. Talk to your care team if you may be pregnant. Serious birth defects can occur if you take this medication during pregnancy and for 12 months after the last dose. You will need a negative pregnancy test before starting  this medication. Contraception is recommended while taking this medication and for 12 months after the last dose. Your care team can help you find the option that works for you. Do not breastfeed while taking this medication and for at least 6 months after the last dose. What side effects may I notice from receiving this medication? Side effects that you should report to your care team as soon as possible: Allergic reactions or angioedema--skin rash, itching or hives, swelling of the face, eyes, lips, tongue, arms, or legs, trouble swallowing or breathing Bowel blockage--stomach cramping, unable to have a bowel  movement or pass gas, loss of appetite, vomiting Dizziness, loss of balance or coordination, confusion or trouble speaking Heart attack--pain or tightness in the chest, shoulders, arms, or jaw, nausea, shortness of breath, cold or clammy skin, feeling faint or lightheaded Heart rhythm changes--fast or irregular heartbeat, dizziness, feeling faint or lightheaded, chest pain, trouble breathing Infection--fever, chills, cough, sore throat, wounds that don't heal, pain or trouble when passing urine, general feeling of discomfort or being unwell Infusion reactions--chest pain, shortness of breath or trouble breathing, feeling faint or lightheaded Kidney injury--decrease in the amount of urine, swelling of the ankles, hands, or feet Liver injury--right upper belly pain, loss of appetite, nausea, light-colored stool, dark yellow or brown urine, yellowing skin or eyes, unusual weakness or fatigue Redness, blistering, peeling, or loosening of the skin, including inside the mouth Stomach pain that is severe, does not go away, or gets worse Tumor lysis syndrome (TLS)--nausea, vomiting, diarrhea, decrease in the amount of urine, dark urine, unusual weakness or fatigue, confusion, muscle pain or cramps, fast or irregular heartbeat, joint pain Side effects that usually do not require medical attention (report to your care team if they continue or are bothersome): Headache Joint pain Nausea Runny or stuffy nose Unusual weakness or fatigue This list may not describe all possible side effects. Call your doctor for medical advice about side effects. You may report side effects to FDA at 1-800-FDA-1088. Where should I keep my medication? This medication is given in a hospital or clinic. It will not be stored at home. NOTE: This sheet is a summary. It may not cover all possible information. If you have questions about this medicine, talk to your doctor, pharmacist, or health care provider.  2024 Elsevier/Gold  Standard (2021-08-26 00:00:00)

## 2022-09-20 NOTE — Progress Notes (Signed)
Hypersensitivity Reaction note  Date of event: 09/20/22 Time of event: 1340 Generic name of drug involved: Rituximab Name of provider notified of the hypersensitivity reaction: Georga Kaufmann, PA Was agent that likely caused hypersensitivity reaction added to Allergies List within EMR? Yes Chain of events including reaction signs/symptoms, treatment administered, and outcome (e.g., drug resumed; drug discontinued; sent to Emergency Department; etc.) Half way through the infusion pt. developed a red, raised rash to upper right arm (from right elbow to shoulder to right upper back). Itching noted. Rituximab stopped, and emergency protocol initiated. Normal saline bolus started, Georga Kaufmann, [place] notified, mediated with Benadryl 25 mg IV, waited 20 minutes then Rituximab restarted at regular scheduled rate. Rash resolved and itching stopped. Pt. tolerated the rest of the infusion without any other issues. Dr. Candise Che notified of treatment reaction.  [name], [place] 09/20/2022 4:40 PM

## 2022-09-21 ENCOUNTER — Other Ambulatory Visit: Payer: Self-pay | Admitting: Hematology

## 2022-09-21 ENCOUNTER — Telehealth: Payer: Self-pay | Admitting: Family Medicine

## 2022-09-21 MED ORDER — PREDNISONE 20 MG PO TABS: 60.0000 mg | ORAL_TABLET | Freq: Every day | ORAL | 0 refills | Status: DC

## 2022-09-22 ENCOUNTER — Telehealth: Payer: Self-pay

## 2022-09-22 NOTE — Telephone Encounter (Signed)
Contacted pt per Dr Candise Che to let her know:that Dr Candise Che has sent in prednisone to pretreat her for 3 days prior to each dose of rituxan and switched to IV cetirizine instead of benadryl for premeds. Pt acknowledged information and verbalized understanding.

## 2022-09-22 NOTE — Telephone Encounter (Signed)
-----   Message from Irven Easterly, RN sent at 09/21/2022  5:57 PM EDT ----- Regarding: First time Rituxan Pt. first time Rituximab developed a rash midway through treatment. Infusion stopped extra Benadryl given then resumed without further issues. Thank you!

## 2022-09-22 NOTE — Telephone Encounter (Signed)
Heather Andrews states that she feeling a little washed out. She woke up with a headache. Afebrile Told her she could use tylenol for headache. She is eating, drinking, and urinating well. Encouraged her to to increase her fluid intake to 64 ounces of caffeine free fluid a day. She knows to call the office at [phone] if she has any questions or concerns.

## 2022-09-23 ENCOUNTER — Encounter: Payer: Self-pay | Admitting: Family Medicine

## 2022-09-27 ENCOUNTER — Other Ambulatory Visit: Payer: Self-pay

## 2022-09-27 DIAGNOSIS — C8294 Follicular lymphoma, unspecified, lymph nodes of axilla and upper limb: Secondary | ICD-10-CM

## 2022-09-27 NOTE — Telephone Encounter (Signed)
Letter sent with DEXA results and recommendations

## 2022-09-28 ENCOUNTER — Inpatient Hospital Stay: Payer: Medicare PPO

## 2022-09-28 ENCOUNTER — Other Ambulatory Visit: Payer: Self-pay

## 2022-09-28 ENCOUNTER — Inpatient Hospital Stay: Payer: Medicare PPO | Admitting: Hematology

## 2022-09-28 VITALS — BP 116/45 | HR 72 | Temp 98.4°F | Resp 16

## 2022-09-28 DIAGNOSIS — Z7189 Other specified counseling: Secondary | ICD-10-CM | POA: Diagnosis not present

## 2022-09-28 DIAGNOSIS — Z5112 Encounter for antineoplastic immunotherapy: Secondary | ICD-10-CM | POA: Diagnosis not present

## 2022-09-28 DIAGNOSIS — C8294 Follicular lymphoma, unspecified, lymph nodes of axilla and upper limb: Secondary | ICD-10-CM

## 2022-09-28 DIAGNOSIS — Z79899 Other long term (current) drug therapy: Secondary | ICD-10-CM | POA: Diagnosis not present

## 2022-09-28 LAB — CBC WITH DIFFERENTIAL (CANCER CENTER ONLY)
Abs Immature Granulocytes: 0.05 10*3/uL (ref 0.00–0.07)
Basophils Absolute: 0 10*3/uL (ref 0.0–0.1)
Basophils Relative: 0 %
Eosinophils Absolute: 0 10*3/uL (ref 0.0–0.5)
Eosinophils Relative: 0 %
HCT: 33.3 % — ABNORMAL LOW (ref 36.0–46.0)
Hemoglobin: 11.1 g/dL — ABNORMAL LOW (ref 12.0–15.0)
Immature Granulocytes: 0 %
Lymphocytes Relative: 15 %
Lymphs Abs: 1.7 10*3/uL (ref 0.7–4.0)
MCH: 31.4 pg (ref 26.0–34.0)
MCHC: 33.3 g/dL (ref 30.0–36.0)
MCV: 94.1 fL (ref 80.0–100.0)
Monocytes Absolute: 1.2 10*3/uL — ABNORMAL HIGH (ref 0.1–1.0)
Monocytes Relative: 10 %
Neutro Abs: 8.3 10*3/uL — ABNORMAL HIGH (ref 1.7–7.7)
Neutrophils Relative %: 75 %
Platelet Count: 231 10*3/uL (ref 150–400)
RBC: 3.54 MIL/uL — ABNORMAL LOW (ref 3.87–5.11)
RDW: 14.2 % (ref 11.5–15.5)
WBC Count: 11.3 10*3/uL — ABNORMAL HIGH (ref 4.0–10.5)
nRBC: 0 % (ref 0.0–0.2)

## 2022-09-28 LAB — CMP (CANCER CENTER ONLY)
ALT: 18 U/L (ref 0–44)
AST: 17 U/L (ref 15–41)
Albumin: 3.7 g/dL (ref 3.5–5.0)
Alkaline Phosphatase: 46 U/L (ref 38–126)
Anion gap: 6 (ref 5–15)
BUN: 26 mg/dL — ABNORMAL HIGH (ref 8–23)
CO2: 29 mmol/L (ref 22–32)
Calcium: 9.2 mg/dL (ref 8.9–10.3)
Chloride: 105 mmol/L (ref 98–111)
Creatinine: 0.6 mg/dL (ref 0.44–1.00)
GFR, Estimated: >60 mL/min (ref >60)
Glucose, Bld: 126 mg/dL — ABNORMAL HIGH (ref 70–99)
Potassium: 3.4 mmol/L — ABNORMAL LOW (ref 3.5–5.1)
Sodium: 140 mmol/L (ref 135–145)
Total Bilirubin: 0.3 mg/dL (ref 0.3–1.2)
Total Protein: 6.6 g/dL (ref 6.5–8.1)

## 2022-09-28 LAB — LACTATE DEHYDROGENASE: LDH: 154 U/L (ref 98–192)

## 2022-09-28 MED ORDER — CETIRIZINE HCL 10 MG/ML IV SOLN
10.0000 mg | Freq: Once | INTRAVENOUS | Status: AC
  Administered 2022-09-28: 10 mg via INTRAVENOUS
  Filled 2022-09-28: qty 1

## 2022-09-28 MED ORDER — METHYLPREDNISOLONE SODIUM SUCC 125 MG IJ SOLR
125.0000 mg | Freq: Once | INTRAMUSCULAR | Status: AC
  Administered 2022-09-28: 125 mg via INTRAVENOUS

## 2022-09-28 MED ORDER — SODIUM CHLORIDE 0.9 % IV SOLN
375.0000 mg/m2 | Freq: Once | INTRAVENOUS | Status: AC
  Administered 2022-09-28: 700 mg via INTRAVENOUS
  Filled 2022-09-28: qty 50

## 2022-09-28 MED ORDER — FAMOTIDINE IN NACL 20-0.9 MG/50ML-% IV SOLN
20.0000 mg | Freq: Once | INTRAVENOUS | Status: AC
  Administered 2022-09-28: 20 mg via INTRAVENOUS
  Filled 2022-09-28: qty 50

## 2022-09-28 MED ORDER — ACETAMINOPHEN 500 MG PO TABS
1000.0000 mg | ORAL_TABLET | Freq: Once | ORAL | Status: AC
  Administered 2022-09-28: 1000 mg via ORAL
  Filled 2022-09-28: qty 2

## 2022-09-28 MED ORDER — SODIUM CHLORIDE 0.9 % IV SOLN: Freq: Once | INTRAVENOUS | Status: AC

## 2022-09-28 MED ORDER — MONTELUKAST SODIUM 10 MG PO TABS
10.0000 mg | ORAL_TABLET | Freq: Once | ORAL | Status: AC
  Administered 2022-09-28: 10 mg via ORAL
  Filled 2022-09-28: qty 1

## 2022-09-28 NOTE — Progress Notes (Signed)
Patient seen by Dr. Addison Naegeli are within treatment parameters.  Labs reviewed: and are within treatment parameters. Dr Candise Che aware K: 3.4  Per physician team, patient is ready for treatment and there are NO modifications to the treatment plan.

## 2022-09-28 NOTE — Progress Notes (Signed)
HEMATOLOGY/ONCOLOGY CLINIC NOTE  Date of Service: 09/02/22  Patient Care Team: McDiarmid, Leighton Roach, MD as PCP - General (Family Medicine) [name], MD as Consulting Physician (Hematology)  CHIEF COMPLAINTS/PURPOSE OF CONSULTATION:  Evaluation and management of newly-diagnosed follicular lymphoma.  HISTORY OF PRESENTING ILLNESS:   Heather Andrews is a wonderful 82 y.o. female who has been referred to [place] by [name], MD for evaluation and management of newly-diagnosed low-grade follicular lymphoma.   Today, she is accompanied by her daughter. She reports that she has had right LE edema for about 6 months. Upon examination, patient has bilateral LE edema, right more than left.   She also reports having a rash at one point. Patient denies any medication changes, long distance travel, or unusual activity. Patient denies any redness/pain in the area.  Patient denies back pain, recent infections, abdominal pain, change in bowel habits, fever, chills, or night sweats.  She does report that she tripped and suffered a fall in January 2024, causing her to hit the side of her head.  Patient denies any breast discomfort or swelling besides in her LE. She does however sometimes experience tingling in her hands.  Patient does confirm having a silent stroke at some point previously, but is unsure of when it occurred.   Patient does report that she is "slowing down" in general, which she attributes to her age. Patient denies any sudden new fatigue or other major limiting medical issues at this time. Patient does regualrly take vitamin D, but is unsure of how many units she takes. She also takes vitamin K. Patient denies any concern for varicose veins in the past. Patient reports that she will be traveling to [facility] for a week and will leave May 4th.  INTERVAL HISTORY:  Heather Andrews is a 82 y.o. female here for continued evaluation and management of newly-diagnosed  follicular lymphoma. Patient was last seen by me on 09/02/2022 and complained of chronic fatigue and night sweats.   Today, patient is accompanied by her daughter. She notes she is feeling well overall.  She complained of a rash in her posterior neck during treatment that resolved quickly after  Rituxan infusion treatment. She denies any further rashes or chills.  She complained that she did not feel well 2 days after treatment and was advised to stay hydrated. She has since remained hydrated.  She denies mouth sores, fever, chills, new lump/bumps, changes in current lumps/bumps, back pain, abdominal pain. Patient complains of occasional night sweats.She denies any frequent ear or sinus infections after swimming. Patient denies any pool sensitivities.   MEDICAL HISTORY: . 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 Fontaine, OBGYN).   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   Sensorineural hearing loss (SNHL) of both ears    Patient wears biaural hearing aids   Stress incontinence, female    USI    SURGICAL HISTORY: Past Surgical History:  Procedure Laterality Date   BASAL CELL CARCINOMA EXCISION     CATARACT EXTRACTION  Cyst removed from chest     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"    SOCIAL HISTORY: Social History   Socioeconomic History   Marital status: Widowed    Spouse name: Not on file   Number of children: 3   Years of education: 27   Highest education level: Not on file  Occupational History   Occupation: Tourist information centre manager    Comment: Retired  Tobacco Use    Smoking status: Former    Packs/day: 0.50    Years: 45.00    Additional pack years: 0.00    Total pack years: 22.50    Types: Cigarettes    Start date: 04/1956   Smokeless tobacco: Never  Vaping Use   Vaping Use: Never used  Substance and Sexual Activity   Alcohol use: Yes    Comment: occassionally   Drug use: No   Sexual activity: Never    Comment: 1st intercourse 82 yo-Fewer than 5 partners  Other Topics Concern   Not on file  Social History Narrative   Widowed, lives alone though a daughter lives next door.   3 children: [name]; [name], Lawernce Pitts   Activities: reading, interacting with her two cats, playing cards, corresponding with others (email, snail mail), needlework, cooking   Patient indicated on Geri Assessment questionnaire that she would want her daughter, Clelia Croft to make medical decisions on her behalf should she be unable. [phone]).  The patient gives permission to speak to Ms Romnet about issue around the patient's health.      Patient lives in a townhouse.  13 steps to get into home.  Home has two levels.    Assists Devices in home: Grab Bars   Regular exercise: Yoga once a week   Transportation: Patient drives   Social Determinants of Health   Financial Resource Strain: Low Risk  (03/19/2021)   Overall Financial Resource Strain (CARDIA)    Difficulty of Paying Living Expenses: Not hard at all  Food Insecurity: No Food Insecurity (08/04/2022)   Hunger Vital Sign    Worried About Running Out of Food in the Last Year: Never true    Ran Out of Food in the Last Year: Never true  Transportation Needs: No Transportation Needs (08/04/2022)   PRAPARE - Administrator, Civil Service (Medical): No    Lack of Transportation (Non-Medical): No  Physical Activity: Unknown (03/19/2021)   Exercise Vital Sign    Days of Exercise per Week: 0 days    Minutes of Exercise per Session: Not on file  Stress: Not on file  Social Connections:  Moderately Integrated (03/19/2021)   Social Connection and Isolation Panel [NHANES]    Frequency of Communication with Friends and Family: More than three times a week    Frequency of Social Gatherings with Friends and Family: More than three times a week    Attends Religious Services: More than 4 times per year    Active Member of [employer] or Organizations: Yes    Attends Banker Meetings: More than 4 times per year    Marital Status: Widowed  Intimate Partner Violence: Not At Risk (08/04/2022)   Humiliation, Afraid, Rape, and Kick questionnaire    Fear of Current or Ex-Partner: No    Emotionally Abused: No    Physically Abused: No    Sexually Abused: No    FAMILY HISTORY: Family History  Problem Relation Age of Onset   Hypertension Mother  Heart disease Paternal Grandmother    Heart disease Paternal Grandfather    Cancer Brother        Leukemia    ALLERGIES:  is allergic to lisinopril and ephedrine-guaifenesin.  MEDICATIONS:  Current Outpatient Medications  Medication Sig Dispense Refill   albuterol (VENTOLIN HFA) 108 (90 Base) MCG/ACT inhaler Inhale 2 puffs into the lungs every 6 (six) hours as needed for wheezing or shortness of breath. 18 g 5   amLODipine (NORVASC) 5 MG tablet 1 tablet. (Patient not taking: Reported on 08/04/2022)     apixaban (ELIQUIS) 5 MG TABS tablet Take 1 tablet (5 mg total) by mouth 2 (two) times daily. 60 tablet 2   calcium carbonate (OSCAL) 1500 (600 Ca) MG TABS tablet Take 600 mg by mouth 2 (two) times daily.     Cholecalciferol (VITAMIN D3) 125 MCG (5000 UT) CAPS Take 125 mcg by mouth daily.     Cyanocobalamin (VITAMIN B-12 PO) Take 1 tablet by mouth daily.     denosumab (PROLIA) 60 MG/ML SOSY injection Inject 60 mg into the skin every 6 (six) months.     GEMTESA 75 MG TABS Take 1 tablet by mouth daily.     hydrochlorothiazide (HYDRODIURIL) 25 MG tablet Take 1 tablet (25 mg total) by mouth daily. 90 tablet 3   rosuvastatin (CRESTOR) 20  MG tablet Take 1 tablet (20 mg total) by mouth daily. 90 tablet 1   Tiotropium Bromide-Olodaterol (STIOLTO RESPIMAT) 2.5-2.5 MCG/ACT AERS INHALE 2 PUFFS BY MOUTH INTO THE LUNGS DAILY 4 g 11   tiZANidine HCl (ZANAFLEX PO)  (Patient not taking: Reported on 08/04/2022)     triamcinolone cream (KENALOG) 0.1 % Apply 1 Application topically 2 (two) times daily. Rub into right leg redness once a day until redness resolves. (Patient not taking: Reported on 08/04/2022) 45 g 0   No current facility-administered medications for this visit.    REVIEW OF SYSTEMS:    10 Point review of Systems was done is negative except as noted above.  PHYSICAL EXAMINATION: ECOG PERFORMANCE STATUS: 1 - Symptomatic but completely ambulatory  . Vitals:   09/02/22 1154  BP: 139/60  Pulse: 83  Resp: 16  Temp: (!) 97.5 F (36.4 C)  SpO2: 94%    Filed Weights   09/02/22 1154  Weight: 165 lb 14.4 oz (75.3 kg)   .Body mass index is 32.4 kg/m. GENERAL:alert, in no acute distress and comfortable SKIN: no acute rashes, no significant lesions EYES: conjunctiva are pink and non-injected, sclera anicteric OROPHARYNX: MMM, no exudates, no oropharyngeal erythema or ulceration NECK: supple, no JVD LYMPH:  no palpable lymphadenopathy in the cervical, axillary or inguinal regions LUNGS: clear to auscultation b/l with normal respiratory effort HEART: regular rate & rhythm ABDOMEN:  normoactive bowel sounds , non tender, not distended. Extremity: no pedal edema PSYCH: alert & oriented x 3 with fluent speech NEURO: no focal motor/sensory deficits   LABORATORY DATA:  I have reviewed the data as listed .    Latest Ref Rng & Units 09/28/2022    8:41 AM 09/20/2022    7:58 AM 08/04/2022   10:06 AM  CBC  WBC 4.0 - 10.5 K/uL 11.3  7.2  8.7   Hemoglobin 12.0 - 15.0 g/dL 16.1  09.6  04.5   Hematocrit 36.0 - 46.0 % 33.3  34.9  36.1   Platelets 150 - 400 K/uL 231  176  196    .    Latest Ref Rng & Units 09/28/2022  8:41  AM 09/20/2022    7:58 AM 08/04/2022   10:06 AM  CMP  Glucose 70 - 99 mg/dL 161  096  045   BUN 8 - 23 mg/dL 26  24  18    Creatinine 0.44 - 1.00 mg/dL 4.09  8.11  9.14   Sodium 135 - 145 mmol/L 140  140  139   Potassium 3.5 - 5.1 mmol/L 3.4  3.9  4.0   Chloride 98 - 111 mmol/L 105  104  100   CO2 22 - 32 mmol/L 29  28  33   Calcium 8.9 - 10.3 mg/dL 9.2  9.6  78.2   Total Protein 6.5 - 8.1 g/dL 6.6  6.9  7.3   Total Bilirubin 0.3 - 1.2 mg/dL 0.3  0.4  0.4   Alkaline Phos 38 - 126 U/L 46  49  59   AST 15 - 41 U/L 17  17  17    ALT 0 - 44 U/L 18  11  11     . Lab Results  Component Value Date   LDH 170 08/04/2022   Component     Latest Ref Rng 08/04/2022  Vitamin D, 25-Hydroxy     30 - 100 ng/mL 49.94   HIV Screen 4th Generation wRfx     Non Reactive  Non Reactive   Hepatitis B Surface Ag     NON REACTIVE  NON REACTIVE   Hep B Core Total Ab     NON REACTIVE  NON REACTIVE   HCV Ab     NON REACTIVE  NON REACTIVE      Needle core biopsy 07/27/2022:   Breast Ultrasound 07/20/2022:  Ultrasound guided biopsy:  Ultrasound guided biopsy:       RADIOGRAPHIC STUDIES: I have personally reviewed the radiological images as listed and agreed with the findings in the report. NM PET Image Initial (PI) Skull Base To Thigh  Result Date: 08/31/2022 CLINICAL DATA:  Initial treatment strategy for follicular lymphoma. EXAM: NUCLEAR MEDICINE PET SKULL BASE TO THIGH TECHNIQUE: 8.33 mCi F-18 FDG was injected intravenously. Full-ring PET imaging was performed from the skull base to thigh after the radiotracer. CT data was obtained and used for attenuation correction and anatomic localization. Fasting blood glucose: 106 mg/dl COMPARISON:  Abdominopelvic CT 05/10/2021. FINDINGS: Mediastinal blood pool activity: SUV max 2.4 Liver activity: SUV max 3.3 NECK: There are numerous mildly enlarged cervical lymph nodes bilaterally with low level hypermetabolic activity. There is prominent hypermetabolic  activity within the lymphoid tissue of Waldeyer's ring (SUV max 8.4), nonspecific and potentially physiologic. Incidental CT findings: Bilateral carotid atherosclerosis. CHEST: There are multiple mildly to moderately enlarged mediastinal, hilar and axillary lymph nodes bilaterally with low to intermediate level metabolic activity. Representative nodes include a lateral AP window node measuring 2.2 cm on image 61/4 (SUV max 5.0), a right axillary node measuring 1.2 cm on image 55/4 (SUV max 4.3) and a left axillary node measuring 1.7 cm on image 57/4 (SUV max 4.7). No hypermetabolic pulmonary activity or suspicious nodularity. Incidental CT findings: Along the posterior aspect of the right thyroid lobe is a nodule measuring 2.9 x 2.0 cm on image 42/4. This demonstrates no hypermetabolic activity. This could reflect a thyroid nodule or a lymph node without hypermetabolic activity. Atherosclerosis of the aorta, great vessels and coronary arteries. ABDOMEN/PELVIS: There is no hypermetabolic activity within the liver, adrenal glands, spleen or pancreas. There are multiple hypermetabolic retroperitoneal, mesenteric, pelvic and inguinal lymph nodes bilaterally. Representative lesions include  a right common iliac nodal mass measuring 3.3 x 2.7 cm on image 143/4 (SUV max 5.7), pelvic sidewall nodes measuring 2.3 cm short axis on the right on image 159/4 (SUV max 5.5) and 2.3 cm on the left, same image, SUV max 5.7. 5.0 x 2.2 cm right inguinal node on image 169/4 has an SUV max of 5.5. Bowel activity within physiologic limits. Incidental CT findings: Diffuse aortic and branch vessel atherosclerosis without evidence of aneurysm. Moderate diverticular changes in the distal colon. No evidence of bowel or ureteral obstruction. SKELETON: There is no hypermetabolic activity to suggest osseous metastatic disease. Incidental CT findings: Moderate lumbar spondylosis. IMPRESSION: 1. Widespread hypermetabolic lymphadenopathy throughout  the neck, chest, abdomen and pelvis consistent with known lymphoma ([place] 4/5). 2. No evidence of solid organ, osseous or pulmonary involvement. 3. Indeterminate hypermetabolic activity within the lymphoid tissue of Waldeyer's ring. This could be physiologic, although lymphomatous involvement cannot be excluded. 4. 2.9 cm nodule along the posterior aspect of the right thyroid lobe without hypermetabolic activity, potentially a thyroid nodule or a lymph node without hypermetabolic activity. Suggest attention on follow-up CT. If persistent after improvement in adenopathy, this could be further evaluated with thyroid ultrasound. 5.  Aortic Atherosclerosis (ICD10-I70.0). Electronically Signed   By: Carey Bullocks M.D.   On: 08/31/2022 12:15   VAS [place] LOWER EXTREMITY VENOUS (DVT)  Result Date: 08/05/2022  Lower Venous DVT Study Patient Name:  SHEYANN ALONGI [facility]  Date of Exam:   08/05/2022 Medical Rec #: [id number]        Accession #:    [id number] Date of Birth: 1941-04-01         Patient Gender: F Patient Age:   29 years Exam Location:  [facility] Procedure:      VAS [place] LOWER EXTREMITY VENOUS (DVT) Referring Phys: Wyvonnia Lora --------------------------------------------------------------------------------  Indications: Swelling R > L.  Risk Factors: Lymphoma (recently diagnosed). Comparison Study: Previous RLEV on 02/04/22 was negative for DVT Performing Technologist: Ernestene Mention RVT, RDMS  Examination Guidelines: A complete evaluation includes B-mode imaging, spectral Doppler, color Doppler, and power Doppler as needed of all accessible portions of each vessel. Bilateral testing is considered an integral part of a complete examination. Limited examinations for reoccurring indications may be performed as noted. The reflux portion of the exam is performed with the patient in reverse Trendelenburg.  +---------+---------------+---------+-----------+----------+--------------+ RIGHT     CompressibilityPhasicitySpontaneityPropertiesThrombus Aging +---------+---------------+---------+-----------+----------+--------------+ CFV      Full           Yes      Yes                                 +---------+---------------+---------+-----------+----------+--------------+ SFJ      Full                                                        +---------+---------------+---------+-----------+----------+--------------+ FV Prox  Full           Yes      Yes                                 +---------+---------------+---------+-----------+----------+--------------+ FV Mid   Full  Yes      Yes                                 +---------+---------------+---------+-----------+----------+--------------+ FV DistalFull           Yes      Yes                                 +---------+---------------+---------+-----------+----------+--------------+ PFV      Full                                                        +---------+---------------+---------+-----------+----------+--------------+ POP      Full           Yes      Yes                                 +---------+---------------+---------+-----------+----------+--------------+ PTV      Full                                                        +---------+---------------+---------+-----------+----------+--------------+ PERO     Full                                                        +---------+---------------+---------+-----------+----------+--------------+   +---------------+---------------+---------+-----------+----------+------------+ LEFT           CompressibilityPhasicitySpontaneityPropertiesThrombus                                                                 Aging        +---------------+---------------+---------+-----------+----------+------------+ CFV            Full           Yes      Yes                                +---------------+---------------+---------+-----------+----------+------------+ SFJ            Full                                                      +---------------+---------------+---------+-----------+----------+------------+ FV Prox        Full           Yes      Yes                               +---------------+---------------+---------+-----------+----------+------------+  FV Mid         Full           Yes      Yes                               +---------------+---------------+---------+-----------+----------+------------+ FV Distal      Full           Yes      Yes                               +---------------+---------------+---------+-----------+----------+------------+ PFV            Full                                                      +---------------+---------------+---------+-----------+----------+------------+ POP            Full           Yes      Yes                               +---------------+---------------+---------+-----------+----------+------------+ PTV            Full                                                      +---------------+---------------+---------+-----------+----------+------------+ PERO           None           No       No                   Acute        +---------------+---------------+---------+-----------+----------+------------+ Perforator VeinNone           No       No                   Acute        +---------------+---------------+---------+-----------+----------+------------+     Summary: BILATERAL: -No evidence of popliteal cyst, bilaterally. RIGHT: - There is no evidence of deep vein thrombosis in the lower extremity.  - Subcutaneous edema in area of calf and ankle. - Ultrasound characteristics of enlarged lymph nodes are noted in the groin.  LEFT: - Findings consistent with acute deep vein thrombosis involving the left peroneal veins. - Findings consistent with acute superficial vein thrombosis  involving the left varicosities or other superficial veins. - Ultrasound characteristics of enlarged lymph nodes noted in the groin.  *See table(s) above for measurements and observations. Electronically signed by Sherald Hess MD on 08/05/2022 at 11:59:38 AM.    Final     ASSESSMENT & PLAN:   Wonderful 82 y.o. female with:  Newly-diagnosed low grade follicular lymphoma incidentally noted LNadenopathy on routine mammogram. History of asymptomatic stroke  Hypertension  Pure hypercholesterolemia  Contusion of right shoulder region  6. Acute DVT of left peroneal veins  PLAN:   -Discussed lab results on 09/28/2022 in detail with patient. CBC was stable and showed WBC of 11.3K, hemoglobin of  11.1, and platelets of 231K. CMP is stable -WBCs mildly elevated, likely due to steroids -will continue to monitor electrolytes to ensure there is no uncontrollable tumor lysis -Will receive Cycle 2 of Rituxan -Will continue pre-medication of prednisone for 3 days to reduce risk of reactions  -will switch Benadryl to IV Cetirizine  -continue Eliquis 5 mg twice daily for left peroneal DVT -continue to take vitamin D3 4000 IU regularly to boost immune system -Advised patient to drink at least 2L of water a day -Informed patient that pre medication prednisone can reduce reaction to Rituxan -Informed patient of side effects of treatment -Answered all of patient's questions, regarding going to the pool and potential risks of going. -Informed patient she can proceed with Prolia shot scheduled foe July 2024 -Discussed patient of potential scan after completing Rituxan treatment and annual follow-ups .   FOLLOW-UP: C2 of Rituxan today Plz schedule remaining 2 cycles of weekly Rituxan per treatment plan. Labs with each treatment MD visit with C4 of treatment in 2 weeks  The total time spent in the appointment was 32 minutes* .  All of the patient's questions were answered with apparent satisfaction. The  patient knows to call the clinic with any problems, questions or concerns.   Wyvonnia Lora MD MS AAHIVMS [facility] Hematology/Oncology Physician [facility]  .*Total Encounter Time as defined by the Centers for Medicare and Medicaid Services includes, in addition to the face-to-face time of a patient visit (documented in the note above) non-face-to-face time: obtaining and reviewing outside history, ordering and reviewing medications, tests or procedures, care coordination (communications with other health care professionals or caregivers) and documentation in the medical record.   I,Mitra Faeizi,acting as a Neurosurgeon for Wyvonnia Lora, MD.,have documented all relevant documentation on the behalf of Wyvonnia Lora, MD,as directed by  Wyvonnia Lora, MD while in the presence of Wyvonnia Lora, MD.  .I have reviewed the above documentation for accuracy and completeness, and I agree with the above. [name] MD

## 2022-09-28 NOTE — Patient Instructions (Signed)
[facility]  Discharge Instructions: Thank you for choosing [facility] to provide your oncology and hematology care.   If you have a lab appointment with the Cancer Center, please go directly to the Cancer Center and check in at the registration area.   Wear comfortable clothing and clothing appropriate for easy access to any Portacath or PICC line.   We strive to give you quality time with your provider. You may need to reschedule your appointment if you arrive late (15 or more minutes).  Arriving late affects you and other patients whose appointments are after yours.  Also, if you miss three or more appointments without notifying the office, you may be dismissed from the clinic at the provider's discretion.      For prescription refill requests, have your pharmacy contact our office and allow 72 hours for refills to be completed.    Today you received the following chemotherapy and/or immunotherapy agent: Rituximab   To help prevent nausea and vomiting after your treatment, we encourage you to take your nausea medication as directed.  BELOW ARE SYMPTOMS THAT SHOULD BE REPORTED IMMEDIATELY: *FEVER GREATER THAN 100.4 F (38 C) OR HIGHER *CHILLS OR SWEATING *NAUSEA AND VOMITING THAT IS NOT CONTROLLED WITH YOUR NAUSEA MEDICATION *UNUSUAL SHORTNESS OF BREATH *UNUSUAL BRUISING OR BLEEDING *URINARY PROBLEMS (pain or burning when urinating, or frequent urination) *BOWEL PROBLEMS (unusual diarrhea, constipation, pain near the anus) TENDERNESS IN MOUTH AND THROAT WITH OR WITHOUT PRESENCE OF ULCERS (sore throat, sores in mouth, or a toothache) UNUSUAL RASH, SWELLING OR PAIN  UNUSUAL VAGINAL DISCHARGE OR ITCHING   Items with * indicate a potential emergency and should be followed up as soon as possible or go to the Emergency Department if any problems should occur.  Please show the CHEMOTHERAPY ALERT CARD or IMMUNOTHERAPY ALERT CARD at check-in  to the Emergency Department and triage nurse.  Should you have questions after your visit or need to cancel or reschedule your appointment, please contact [facility]  Dept: [phone]  and follow the prompts.  Office hours are 8:00 a.m. to 4:30 p.m. Monday - Friday. Please note that voicemails left after 4:00 p.m. may not be returned until the following business day.  We are closed weekends and major holidays. You have access to a nurse at all times for urgent questions. Please call the main number to the clinic Dept: [phone] and follow the prompts.   For any non-urgent questions, you may also contact your provider using MyChart. We now offer e-Visits for anyone 18 and older to request care online for non-urgent symptoms. For details visit [email or web address].   Also download the MyChart app! Go to the app store, search "MyChart", open the app, select [facility], and log in with your MyChart username and password.  Rituximab Injection What is this medication? RITUXIMAB (ri TUX i mab) treats leukemia and lymphoma. It works by blocking a protein that causes cancer cells to grow and multiply. This helps to slow or stop the spread of cancer cells. It may also be used to treat autoimmune conditions, such as arthritis. It works by slowing down an overactive immune system. It is a monoclonal antibody. This medicine may be used for other purposes; ask your health care provider or pharmacist if you have questions. COMMON BRAND NAME(S): RIABNI, Rituxan, RUXIENCE, truxima What should I tell my care team before I take this medication? They need to know if   you have any of these conditions: Chest pain Heart disease Immune system problems Infection, such as chickenpox, cold sores, hepatitis B, herpes Irregular heartbeat or rhythm Kidney disease Low blood counts, such as low white cells, platelets, red cells Lung disease Recent or upcoming vaccine An unusual  or allergic reaction to rituximab, other medications, foods, dyes, or preservatives Pregnant or trying to get pregnant Breast-feeding How should I use this medication? This medication is injected into a vein. It is given by a care team in a hospital or clinic setting. A special MedGuide will be given to you before each treatment. Be sure to read this information carefully each time. Talk to your care team about the use of this medication in children. While this medication may be prescribed for children as young as 6 months for selected conditions, precautions do apply. Overdosage: If you think you have taken too much of this medicine contact a poison control center or emergency room at once. NOTE: This medicine is only for you. Do not share this medicine with others. What if I miss a dose? Keep appointments for follow-up doses. It is important not to miss your dose. Call your care team if you are unable to keep an appointment. What may interact with this medication? Do not take this medication with any of the following: Live vaccines This medication may also interact with the following: Cisplatin This list may not describe all possible interactions. Give your health care provider a list of all the medicines, herbs, non-prescription drugs, or dietary supplements you use. Also tell them if you smoke, drink alcohol, or use illegal drugs. Some items may interact with your medicine. What should I watch for while using this medication? Your condition will be monitored carefully while you are receiving this medication. You may need blood work while taking this medication. This medication can cause serious infusion reactions. To reduce the risk your care team may give you other medications to take before receiving this one. Be sure to follow the directions from your care team. This medication may increase your risk of getting an infection. Call your care team for advice if you get a fever, chills, sore  throat, or other symptoms of a cold or flu. Do not treat yourself. Try to avoid being around people who are sick. Call your care team if you are around anyone with measles, chickenpox, or if you develop sores or blisters that do not heal properly. Avoid taking medications that contain aspirin, acetaminophen, ibuprofen, naproxen, or ketoprofen unless instructed by your care team. These medications may hide a fever. This medication may cause serious skin reactions. They can happen weeks to months after starting the medication. Contact your care team right away if you notice fevers or flu-like symptoms with a rash. The rash may be red or purple and then turn into blisters or peeling of the skin. You may also notice a red rash with swelling of the face, lips, or lymph nodes in your neck or under your arms. In some patients, this medication may cause a serious brain infection that may cause death. If you have any problems seeing, thinking, speaking, walking, or standing, tell your care team right away. If you cannot reach your care team, urgently seek another source of medical care. Talk to your care team if you may be pregnant. Serious birth defects can occur if you take this medication during pregnancy and for 12 months after the last dose. You will need a negative pregnancy test before starting   this medication. Contraception is recommended while taking this medication and for 12 months after the last dose. Your care team can help you find the option that works for you. Do not breastfeed while taking this medication and for at least 6 months after the last dose. What side effects may I notice from receiving this medication? Side effects that you should report to your care team as soon as possible: Allergic reactions or angioedema--skin rash, itching or hives, swelling of the face, eyes, lips, tongue, arms, or legs, trouble swallowing or breathing Bowel blockage--stomach cramping, unable to have a bowel  movement or pass gas, loss of appetite, vomiting Dizziness, loss of balance or coordination, confusion or trouble speaking Heart attack--pain or tightness in the chest, shoulders, arms, or jaw, nausea, shortness of breath, cold or clammy skin, feeling faint or lightheaded Heart rhythm changes--fast or irregular heartbeat, dizziness, feeling faint or lightheaded, chest pain, trouble breathing Infection--fever, chills, cough, sore throat, wounds that don't heal, pain or trouble when passing urine, general feeling of discomfort or being unwell Infusion reactions--chest pain, shortness of breath or trouble breathing, feeling faint or lightheaded Kidney injury--decrease in the amount of urine, swelling of the ankles, hands, or feet Liver injury--right upper belly pain, loss of appetite, nausea, light-colored stool, dark yellow or brown urine, yellowing skin or eyes, unusual weakness or fatigue Redness, blistering, peeling, or loosening of the skin, including inside the mouth Stomach pain that is severe, does not go away, or gets worse Tumor lysis syndrome (TLS)--nausea, vomiting, diarrhea, decrease in the amount of urine, dark urine, unusual weakness or fatigue, confusion, muscle pain or cramps, fast or irregular heartbeat, joint pain Side effects that usually do not require medical attention (report to your care team if they continue or are bothersome): Headache Joint pain Nausea Runny or stuffy nose Unusual weakness or fatigue This list may not describe all possible side effects. Call your doctor for medical advice about side effects. You may report side effects to FDA at 1-800-FDA-1088. Where should I keep my medication? This medication is given in a hospital or clinic. It will not be stored at home. NOTE: This sheet is a summary. It may not cover all possible information. If you have questions about this medicine, talk to your doctor, pharmacist, or health care provider.  2024 Elsevier/Gold  Standard (2021-08-26 00:00:00)    

## 2022-10-04 ENCOUNTER — Encounter: Payer: Self-pay | Admitting: Hematology

## 2022-10-04 ENCOUNTER — Other Ambulatory Visit: Payer: Self-pay

## 2022-10-04 DIAGNOSIS — C8294 Follicular lymphoma, unspecified, lymph nodes of axilla and upper limb: Secondary | ICD-10-CM

## 2022-10-04 NOTE — Progress Notes (Signed)
Called pt to introduce myself as her Dance movement psychotherapist and to discuss the [employer].  I wasn't able to leave a msg so I will try and speak with her when she comes to her next appt.

## 2022-10-05 ENCOUNTER — Encounter: Payer: Self-pay | Admitting: Hematology

## 2022-10-05 ENCOUNTER — Inpatient Hospital Stay: Payer: Medicare PPO

## 2022-10-05 ENCOUNTER — Other Ambulatory Visit: Payer: Self-pay

## 2022-10-05 VITALS — BP 128/57 | HR 71 | Temp 97.7°F | Resp 18 | Wt 167.5 lb

## 2022-10-05 DIAGNOSIS — Z7189 Other specified counseling: Secondary | ICD-10-CM

## 2022-10-05 DIAGNOSIS — C8294 Follicular lymphoma, unspecified, lymph nodes of axilla and upper limb: Secondary | ICD-10-CM

## 2022-10-05 DIAGNOSIS — Z5112 Encounter for antineoplastic immunotherapy: Secondary | ICD-10-CM | POA: Diagnosis not present

## 2022-10-05 DIAGNOSIS — Z79899 Other long term (current) drug therapy: Secondary | ICD-10-CM | POA: Diagnosis not present

## 2022-10-05 LAB — CMP (CANCER CENTER ONLY)
ALT: 18 U/L (ref 0–44)
AST: 16 U/L (ref 15–41)
Albumin: 3.7 g/dL (ref 3.5–5.0)
Alkaline Phosphatase: 48 U/L (ref 38–126)
Anion gap: 8 (ref 5–15)
BUN: 20 mg/dL (ref 8–23)
CO2: 28 mmol/L (ref 22–32)
Calcium: 9.3 mg/dL (ref 8.9–10.3)
Chloride: 101 mmol/L (ref 98–111)
Creatinine: 0.76 mg/dL (ref 0.44–1.00)
GFR, Estimated: >60 mL/min (ref >60)
Glucose, Bld: 107 mg/dL — ABNORMAL HIGH (ref 70–99)
Potassium: 3.6 mmol/L (ref 3.5–5.1)
Sodium: 137 mmol/L (ref 135–145)
Total Bilirubin: 0.3 mg/dL (ref 0.3–1.2)
Total Protein: 6.1 g/dL — ABNORMAL LOW (ref 6.5–8.1)

## 2022-10-05 LAB — CBC WITH DIFFERENTIAL (CANCER CENTER ONLY)
Abs Immature Granulocytes: 0.09 10*3/uL — ABNORMAL HIGH (ref 0.00–0.07)
Basophils Absolute: 0 10*3/uL (ref 0.0–0.1)
Basophils Relative: 0 %
Eosinophils Absolute: 0.1 10*3/uL (ref 0.0–0.5)
Eosinophils Relative: 1 %
HCT: 35 % — ABNORMAL LOW (ref 36.0–46.0)
Hemoglobin: 11.5 g/dL — ABNORMAL LOW (ref 12.0–15.0)
Immature Granulocytes: 1 %
Lymphocytes Relative: 12 %
Lymphs Abs: 1.4 10*3/uL (ref 0.7–4.0)
MCH: 31.5 pg (ref 26.0–34.0)
MCHC: 32.9 g/dL (ref 30.0–36.0)
MCV: 95.9 fL (ref 80.0–100.0)
Monocytes Absolute: 1.4 10*3/uL — ABNORMAL HIGH (ref 0.1–1.0)
Monocytes Relative: 12 %
Neutro Abs: 8.3 10*3/uL — ABNORMAL HIGH (ref 1.7–7.7)
Neutrophils Relative %: 74 %
Platelet Count: 229 10*3/uL (ref 150–400)
RBC: 3.65 MIL/uL — ABNORMAL LOW (ref 3.87–5.11)
RDW: 14.6 % (ref 11.5–15.5)
WBC Count: 11.3 10*3/uL — ABNORMAL HIGH (ref 4.0–10.5)
nRBC: 0 % (ref 0.0–0.2)

## 2022-10-05 LAB — LACTATE DEHYDROGENASE: LDH: 179 U/L (ref 98–192)

## 2022-10-05 MED ORDER — MONTELUKAST SODIUM 10 MG PO TABS
10.0000 mg | ORAL_TABLET | Freq: Once | ORAL | Status: AC
  Administered 2022-10-05: 10 mg via ORAL
  Filled 2022-10-05: qty 1

## 2022-10-05 MED ORDER — ACETAMINOPHEN 500 MG PO TABS
1000.0000 mg | ORAL_TABLET | Freq: Once | ORAL | Status: AC
  Administered 2022-10-05: 1000 mg via ORAL
  Filled 2022-10-05: qty 2

## 2022-10-05 MED ORDER — METHYLPREDNISOLONE SODIUM SUCC 125 MG IJ SOLR
125.0000 mg | Freq: Once | INTRAMUSCULAR | Status: AC
  Administered 2022-10-05: 125 mg via INTRAVENOUS
  Filled 2022-10-05: qty 2

## 2022-10-05 MED ORDER — SODIUM CHLORIDE 0.9 % IV SOLN
375.0000 mg/m2 | Freq: Once | INTRAVENOUS | Status: AC
  Administered 2022-10-05: 700 mg via INTRAVENOUS
  Filled 2022-10-05: qty 50

## 2022-10-05 MED ORDER — FAMOTIDINE IN NACL 20-0.9 MG/50ML-% IV SOLN
20.0000 mg | Freq: Once | INTRAVENOUS | Status: AC
  Administered 2022-10-05: 20 mg via INTRAVENOUS
  Filled 2022-10-05: qty 50

## 2022-10-05 MED ORDER — CETIRIZINE HCL 10 MG/ML IV SOLN
10.0000 mg | Freq: Once | INTRAVENOUS | Status: AC
  Administered 2022-10-05: 10 mg via INTRAVENOUS
  Filled 2022-10-05: qty 1

## 2022-10-05 MED ORDER — SODIUM CHLORIDE 0.9 % IV SOLN: Freq: Once | INTRAVENOUS | Status: AC

## 2022-10-05 NOTE — Patient Instructions (Signed)
[facility]  Discharge Instructions: Thank you for choosing [facility] to provide your oncology and hematology care.   If you have a lab appointment with the Cancer Center, please go directly to the Cancer Center and check in at the registration area.   Wear comfortable clothing and clothing appropriate for easy access to any Portacath or PICC line.   We strive to give you quality time with your provider. You may need to reschedule your appointment if you arrive late (15 or more minutes).  Arriving late affects you and other patients whose appointments are after yours.  Also, if you miss three or more appointments without notifying the office, you may be dismissed from the clinic at the provider's discretion.      For prescription refill requests, have your pharmacy contact our office and allow 72 hours for refills to be completed.    Today you received the following chemotherapy and/or immunotherapy agent: Rituximab   To help prevent nausea and vomiting after your treatment, we encourage you to take your nausea medication as directed.  BELOW ARE SYMPTOMS THAT SHOULD BE REPORTED IMMEDIATELY: *FEVER GREATER THAN 100.4 F (38 C) OR HIGHER *CHILLS OR SWEATING *NAUSEA AND VOMITING THAT IS NOT CONTROLLED WITH YOUR NAUSEA MEDICATION *UNUSUAL SHORTNESS OF BREATH *UNUSUAL BRUISING OR BLEEDING *URINARY PROBLEMS (pain or burning when urinating, or frequent urination) *BOWEL PROBLEMS (unusual diarrhea, constipation, pain near the anus) TENDERNESS IN MOUTH AND THROAT WITH OR WITHOUT PRESENCE OF ULCERS (sore throat, sores in mouth, or a toothache) UNUSUAL RASH, SWELLING OR PAIN  UNUSUAL VAGINAL DISCHARGE OR ITCHING   Items with * indicate a potential emergency and should be followed up as soon as possible or go to the Emergency Department if any problems should occur.  Please show the CHEMOTHERAPY ALERT CARD or IMMUNOTHERAPY ALERT CARD at check-in  to the Emergency Department and triage nurse.  Should you have questions after your visit or need to cancel or reschedule your appointment, please contact [facility]  Dept: [phone]  and follow the prompts.  Office hours are 8:00 a.m. to 4:30 p.m. Monday - Friday. Please note that voicemails left after 4:00 p.m. may not be returned until the following business day.  We are closed weekends and major holidays. You have access to a nurse at all times for urgent questions. Please call the main number to the clinic Dept: [phone] and follow the prompts.   For any non-urgent questions, you may also contact your provider using MyChart. We now offer e-Visits for anyone 18 and older to request care online for non-urgent symptoms. For details visit [email or web address].   Also download the MyChart app! Go to the app store, search "MyChart", open the app, select [facility], and log in with your MyChart username and password.  Rituximab Injection What is this medication? RITUXIMAB (ri TUX i mab) treats leukemia and lymphoma. It works by blocking a protein that causes cancer cells to grow and multiply. This helps to slow or stop the spread of cancer cells. It may also be used to treat autoimmune conditions, such as arthritis. It works by slowing down an overactive immune system. It is a monoclonal antibody. This medicine may be used for other purposes; ask your health care provider or pharmacist if you have questions. COMMON BRAND NAME(S): RIABNI, Rituxan, RUXIENCE, truxima What should I tell my care team before I take this medication? They need to know if   you have any of these conditions: Chest pain Heart disease Immune system problems Infection, such as chickenpox, cold sores, hepatitis B, herpes Irregular heartbeat or rhythm Kidney disease Low blood counts, such as low white cells, platelets, red cells Lung disease Recent or upcoming vaccine An unusual  or allergic reaction to rituximab, other medications, foods, dyes, or preservatives Pregnant or trying to get pregnant Breast-feeding How should I use this medication? This medication is injected into a vein. It is given by a care team in a hospital or clinic setting. A special MedGuide will be given to you before each treatment. Be sure to read this information carefully each time. Talk to your care team about the use of this medication in children. While this medication may be prescribed for children as young as 6 months for selected conditions, precautions do apply. Overdosage: If you think you have taken too much of this medicine contact a poison control center or emergency room at once. NOTE: This medicine is only for you. Do not share this medicine with others. What if I miss a dose? Keep appointments for follow-up doses. It is important not to miss your dose. Call your care team if you are unable to keep an appointment. What may interact with this medication? Do not take this medication with any of the following: Live vaccines This medication may also interact with the following: Cisplatin This list may not describe all possible interactions. Give your health care provider a list of all the medicines, herbs, non-prescription drugs, or dietary supplements you use. Also tell them if you smoke, drink alcohol, or use illegal drugs. Some items may interact with your medicine. What should I watch for while using this medication? Your condition will be monitored carefully while you are receiving this medication. You may need blood work while taking this medication. This medication can cause serious infusion reactions. To reduce the risk your care team may give you other medications to take before receiving this one. Be sure to follow the directions from your care team. This medication may increase your risk of getting an infection. Call your care team for advice if you get a fever, chills, sore  throat, or other symptoms of a cold or flu. Do not treat yourself. Try to avoid being around people who are sick. Call your care team if you are around anyone with measles, chickenpox, or if you develop sores or blisters that do not heal properly. Avoid taking medications that contain aspirin, acetaminophen, ibuprofen, naproxen, or ketoprofen unless instructed by your care team. These medications may hide a fever. This medication may cause serious skin reactions. They can happen weeks to months after starting the medication. Contact your care team right away if you notice fevers or flu-like symptoms with a rash. The rash may be red or purple and then turn into blisters or peeling of the skin. You may also notice a red rash with swelling of the face, lips, or lymph nodes in your neck or under your arms. In some patients, this medication may cause a serious brain infection that may cause death. If you have any problems seeing, thinking, speaking, walking, or standing, tell your care team right away. If you cannot reach your care team, urgently seek another source of medical care. Talk to your care team if you may be pregnant. Serious birth defects can occur if you take this medication during pregnancy and for 12 months after the last dose. You will need a negative pregnancy test before starting   this medication. Contraception is recommended while taking this medication and for 12 months after the last dose. Your care team can help you find the option that works for you. Do not breastfeed while taking this medication and for at least 6 months after the last dose. What side effects may I notice from receiving this medication? Side effects that you should report to your care team as soon as possible: Allergic reactions or angioedema--skin rash, itching or hives, swelling of the face, eyes, lips, tongue, arms, or legs, trouble swallowing or breathing Bowel blockage--stomach cramping, unable to have a bowel  movement or pass gas, loss of appetite, vomiting Dizziness, loss of balance or coordination, confusion or trouble speaking Heart attack--pain or tightness in the chest, shoulders, arms, or jaw, nausea, shortness of breath, cold or clammy skin, feeling faint or lightheaded Heart rhythm changes--fast or irregular heartbeat, dizziness, feeling faint or lightheaded, chest pain, trouble breathing Infection--fever, chills, cough, sore throat, wounds that don't heal, pain or trouble when passing urine, general feeling of discomfort or being unwell Infusion reactions--chest pain, shortness of breath or trouble breathing, feeling faint or lightheaded Kidney injury--decrease in the amount of urine, swelling of the ankles, hands, or feet Liver injury--right upper belly pain, loss of appetite, nausea, light-colored stool, dark yellow or brown urine, yellowing skin or eyes, unusual weakness or fatigue Redness, blistering, peeling, or loosening of the skin, including inside the mouth Stomach pain that is severe, does not go away, or gets worse Tumor lysis syndrome (TLS)--nausea, vomiting, diarrhea, decrease in the amount of urine, dark urine, unusual weakness or fatigue, confusion, muscle pain or cramps, fast or irregular heartbeat, joint pain Side effects that usually do not require medical attention (report to your care team if they continue or are bothersome): Headache Joint pain Nausea Runny or stuffy nose Unusual weakness or fatigue This list may not describe all possible side effects. Call your doctor for medical advice about side effects. You may report side effects to FDA at 1-800-FDA-1088. Where should I keep my medication? This medication is given in a hospital or clinic. It will not be stored at home. NOTE: This sheet is a summary. It may not cover all possible information. If you have questions about this medicine, talk to your doctor, pharmacist, or health care provider.  2024 Elsevier/Gold  Standard (2021-08-26 00:00:00)    

## 2022-10-05 NOTE — Progress Notes (Signed)
Spoke with pt when she arrived for her appt to introduce myself as her Dance movement psychotherapist and to discuss the [employer].  I went over what the grant covers and gave her the income requirement but she exceeds the income limit so she doesn't qualify for the grant at this time.

## 2022-10-11 ENCOUNTER — Other Ambulatory Visit: Payer: Self-pay

## 2022-10-11 DIAGNOSIS — C8294 Follicular lymphoma, unspecified, lymph nodes of axilla and upper limb: Secondary | ICD-10-CM

## 2022-10-12 ENCOUNTER — Inpatient Hospital Stay: Payer: Medicare PPO | Admitting: Hematology

## 2022-10-12 ENCOUNTER — Inpatient Hospital Stay: Payer: Medicare PPO

## 2022-10-12 ENCOUNTER — Other Ambulatory Visit: Payer: Self-pay

## 2022-10-12 VITALS — BP 140/52 | HR 75 | Temp 98.1°F | Resp 18 | Wt 167.4 lb

## 2022-10-12 VITALS — BP 135/57 | HR 67 | Resp 16

## 2022-10-12 DIAGNOSIS — C8294 Follicular lymphoma, unspecified, lymph nodes of axilla and upper limb: Secondary | ICD-10-CM | POA: Diagnosis not present

## 2022-10-12 DIAGNOSIS — Z7189 Other specified counseling: Secondary | ICD-10-CM | POA: Diagnosis not present

## 2022-10-12 DIAGNOSIS — Z5112 Encounter for antineoplastic immunotherapy: Secondary | ICD-10-CM

## 2022-10-12 DIAGNOSIS — Z79899 Other long term (current) drug therapy: Secondary | ICD-10-CM | POA: Diagnosis not present

## 2022-10-12 LAB — CBC WITH DIFFERENTIAL (CANCER CENTER ONLY)
Abs Immature Granulocytes: 0.09 10*3/uL — ABNORMAL HIGH (ref 0.00–0.07)
Basophils Absolute: 0 10*3/uL (ref 0.0–0.1)
Basophils Relative: 0 %
Eosinophils Absolute: 0.1 10*3/uL (ref 0.0–0.5)
Eosinophils Relative: 1 %
HCT: 33.1 % — ABNORMAL LOW (ref 36.0–46.0)
Hemoglobin: 11 g/dL — ABNORMAL LOW (ref 12.0–15.0)
Immature Granulocytes: 1 %
Lymphocytes Relative: 15 %
Lymphs Abs: 1.6 10*3/uL (ref 0.7–4.0)
MCH: 31.6 pg (ref 26.0–34.0)
MCHC: 33.2 g/dL (ref 30.0–36.0)
MCV: 95.1 fL (ref 80.0–100.0)
Monocytes Absolute: 1.1 10*3/uL — ABNORMAL HIGH (ref 0.1–1.0)
Monocytes Relative: 10 %
Neutro Abs: 8.2 10*3/uL — ABNORMAL HIGH (ref 1.7–7.7)
Neutrophils Relative %: 73 %
Platelet Count: 224 10*3/uL (ref 150–400)
RBC: 3.48 MIL/uL — ABNORMAL LOW (ref 3.87–5.11)
RDW: 14.5 % (ref 11.5–15.5)
WBC Count: 11.1 10*3/uL — ABNORMAL HIGH (ref 4.0–10.5)
nRBC: 0 % (ref 0.0–0.2)

## 2022-10-12 LAB — CMP (CANCER CENTER ONLY)
ALT: 26 U/L (ref 0–44)
AST: 18 U/L (ref 15–41)
Albumin: 3.7 g/dL (ref 3.5–5.0)
Alkaline Phosphatase: 46 U/L (ref 38–126)
Anion gap: 7 (ref 5–15)
BUN: 30 mg/dL — ABNORMAL HIGH (ref 8–23)
CO2: 31 mmol/L (ref 22–32)
Calcium: 9.5 mg/dL (ref 8.9–10.3)
Chloride: 101 mmol/L (ref 98–111)
Creatinine: 0.75 mg/dL (ref 0.44–1.00)
GFR, Estimated: >60 mL/min (ref >60)
Glucose, Bld: 112 mg/dL — ABNORMAL HIGH (ref 70–99)
Potassium: 3.6 mmol/L (ref 3.5–5.1)
Sodium: 139 mmol/L (ref 135–145)
Total Bilirubin: 0.3 mg/dL (ref 0.3–1.2)
Total Protein: 6.6 g/dL (ref 6.5–8.1)

## 2022-10-12 LAB — LACTATE DEHYDROGENASE: LDH: 213 U/L — ABNORMAL HIGH (ref 98–192)

## 2022-10-12 MED ORDER — ACETAMINOPHEN 500 MG PO TABS
1000.0000 mg | ORAL_TABLET | Freq: Once | ORAL | Status: AC
  Administered 2022-10-12: 1000 mg via ORAL
  Filled 2022-10-12: qty 2

## 2022-10-12 MED ORDER — SODIUM CHLORIDE 0.9 % IV SOLN
375.0000 mg/m2 | Freq: Once | INTRAVENOUS | Status: AC
  Administered 2022-10-12: 700 mg via INTRAVENOUS
  Filled 2022-10-12: qty 50

## 2022-10-12 MED ORDER — CETIRIZINE HCL 10 MG/ML IV SOLN
10.0000 mg | Freq: Once | INTRAVENOUS | Status: AC
  Administered 2022-10-12: 10 mg via INTRAVENOUS
  Filled 2022-10-12: qty 1

## 2022-10-12 MED ORDER — MONTELUKAST SODIUM 10 MG PO TABS
10.0000 mg | ORAL_TABLET | Freq: Once | ORAL | Status: AC
  Administered 2022-10-12: 10 mg via ORAL
  Filled 2022-10-12: qty 1

## 2022-10-12 MED ORDER — SODIUM CHLORIDE 0.9 % IV SOLN: Freq: Once | INTRAVENOUS | Status: AC

## 2022-10-12 MED ORDER — METHYLPREDNISOLONE SODIUM SUCC 125 MG IJ SOLR
125.0000 mg | Freq: Once | INTRAMUSCULAR | Status: AC
  Administered 2022-10-12: 125 mg via INTRAVENOUS
  Filled 2022-10-12: qty 2

## 2022-10-12 MED ORDER — FAMOTIDINE IN NACL 20-0.9 MG/50ML-% IV SOLN
20.0000 mg | Freq: Once | INTRAVENOUS | Status: AC
  Administered 2022-10-12: 20 mg via INTRAVENOUS
  Filled 2022-10-12: qty 50

## 2022-10-12 NOTE — Progress Notes (Signed)
HEMATOLOGY/ONCOLOGY CLINIC NOTE  Date of Service: 10/12/22  Patient Care Team: McDiarmid, Leighton Roach, MD as PCP - General (Family Medicine) Heather Maine, MD as Consulting Physician (Hematology)  CHIEF COMPLAINTS/PURPOSE OF CONSULTATION:  Evaluation and management of newly-diagnosed follicular lymphoma.  HISTORY OF PRESENTING ILLNESS:   Heather Andrews is a wonderful 82 y.o. female who has been referred to [place] by Heather Belling, MD for evaluation and management of newly-diagnosed low-grade follicular lymphoma.   Today, she is accompanied by her daughter. She reports that she has had right LE edema for about 6 months. Upon examination, patient has bilateral LE edema, right more than left.   She also reports having a rash at one point. Patient denies any medication changes, long distance travel, or unusual activity. Patient denies any redness/pain in the area.  Patient denies back pain, recent infections, abdominal pain, change in bowel habits, fever, chills, or night sweats.  She does report that she tripped and suffered a fall in January 2024, causing her to hit the side of her head.  Patient denies any breast discomfort or swelling besides in her LE. She does however sometimes experience tingling in her hands.  Patient does confirm having a silent stroke at some point previously, but is unsure of when it occurred.   Patient does report that she is "slowing down" in general, which she attributes to her age. Patient denies any sudden new fatigue or other major limiting medical issues at this time. Patient does regualrly take vitamin D, but is unsure of how many units she takes. She also takes vitamin K. Patient denies any concern for varicose veins in the past. Patient reports that she will be traveling to [facility] for a week and will leave May 4th.  INTERVAL HISTORY:  Heather Andrews is a 82 y.o. female here for continued evaluation and management of newly-diagnosed  follicular lymphoma. Patient was last seen by me on 09/28/2022 and complained of posterior neck rash which had resolved, occasional night sweats, and feeling poorly overall after receiving treatment.   Today, she is scheduled to receive day 1, cycle 4 of Rituxan. Patient is accompanied by her daughter. She reports that she has tolerated Rituxan well with no toxicity issues. Patient denies any rash issues. Her energy levels have been stable and her leg swelling has improved. She denies any fever, chills, night sweats, back pain, or abdominal pain. She reports that Prednisone has increased her appetite.   MEDICAL HISTORY: . 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 Fontaine, OBGYN).   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   Sensorineural hearing loss (SNHL) of both ears    Patient wears biaural hearing aids   Stress incontinence, female    USI    SURGICAL HISTORY: Past Surgical History:  Procedure Laterality Date   BASAL CELL CARCINOMA EXCISION     CATARACT EXTRACTION     Cyst removed from chest     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"    SOCIAL HISTORY: Social  History   Socioeconomic History   Marital status: Widowed    Spouse name: Not on file   Number of children: 3   Years of education: 64   Highest education level: Not on file  Occupational History   Occupation: Tourist information centre manager    Comment: Retired  Tobacco Use   Smoking status: Former    Packs/day: 0.50    Years: 45.00    Additional pack years: 0.00    Total pack years: 22.50    Types: Cigarettes    Start  date: 04/1956   Smokeless tobacco: Never  Vaping Use   Vaping Use: Never used  Substance and Sexual Activity   Alcohol use: Yes    Comment: occassionally   Drug use: No   Sexual activity: Never    Comment: 1st intercourse 82 yo-Fewer than 5 partners  Other Topics Concern   Not on file  Social History Narrative   Widowed, lives alone though a daughter lives next door.   3 children: Heather Andrews; Heather Andrews, Heather Andrews   Activities: reading, interacting with her two cats, playing cards, corresponding with others (email, snail mail), needlework, cooking   Patient indicated on Geri Assessment questionnaire that she would want her daughter, Heather Andrews to make medical decisions on her behalf should she be unable. [phone]).  The patient gives permission to speak to Ms Romnet about issue around the patient's health.      Patient lives in a townhouse.  13 steps to get into home.  Home has two levels.    Assists Devices in home: Grab Bars   Regular exercise: Yoga once a week   Transportation: Patient drives   Social Determinants of Health   Financial Resource Strain: Low Risk  (03/19/2021)   Overall Financial Resource Strain (CARDIA)    Difficulty of Paying Living Expenses: Not hard at all  Food Insecurity: No Food Insecurity (08/04/2022)   Hunger Vital Sign    Worried About Running Out of Food in the Last Year: Never true    Ran Out of Food in the Last Year: Never true  Transportation Needs: No Transportation Needs (08/04/2022)   PRAPARE - Administrator, Civil Service (Medical): No    Lack of Transportation (Non-Medical): No  Physical Activity: Unknown (03/19/2021)   Exercise Vital Sign    Days of Exercise per Week: 0 days    Minutes of Exercise per Session: Not on file  Stress: Not on file  Social Connections: Moderately Integrated (03/19/2021)   Social Connection and Isolation Panel [NHANES]    Frequency of Communication with Friends and Family: More than three  times a week    Frequency of Social Gatherings with Friends and Family: More than three times a week    Attends Religious Services: More than 4 times per year    Active Member of [employer] or Organizations: Yes    Attends Banker Meetings: More than 4 times per year    Marital Status: Widowed  Intimate Partner Violence: Not At Risk (08/04/2022)   Humiliation, Afraid, Rape, and Kick questionnaire    Fear of Current or Ex-Partner: No    Emotionally Abused: No    Physically Abused: No    Sexually Abused: No    FAMILY HISTORY: Family History  Problem Relation Age of Onset   Hypertension Mother    Heart disease Paternal Grandmother    Heart disease Paternal Grandfather    Cancer Brother  Leukemia    ALLERGIES:  is allergic to lisinopril, ephedrine-guaifenesin, and rituximab.  MEDICATIONS:  Current Outpatient Medications  Medication Sig Dispense Refill   albuterol (VENTOLIN HFA) 108 (90 Base) MCG/ACT inhaler Inhale 2 puffs into the lungs every 6 (six) hours as needed for wheezing or shortness of breath. 18 g 5   apixaban (ELIQUIS) 5 MG TABS tablet Take 1 tablet (5 mg total) by mouth 2 (two) times daily. 60 tablet 4   calcium carbonate (OSCAL) 1500 (600 Ca) MG TABS tablet Take 600 mg by mouth 2 (two) times daily.     Cholecalciferol (VITAMIN D3) 125 MCG (5000 UT) CAPS Take 125 mcg by mouth daily.     Cyanocobalamin (VITAMIN B-12 PO) Take 1 tablet by mouth daily.     denosumab (PROLIA) 60 MG/ML SOSY injection Inject 60 mg into the skin every 6 (six) months.     GEMTESA 75 MG TABS Take 1 tablet by mouth daily.     hydrochlorothiazide (HYDRODIURIL) 25 MG tablet Take 1 tablet (25 mg total) by mouth daily. 90 tablet 3   predniSONE (DELTASONE) 20 MG tablet Take 3 tablets (60 mg total) by mouth daily with breakfast. Take For 3 days prior to each Rituxan infusion to reduce risk of reaction 40 tablet 0   rosuvastatin (CRESTOR) 20 MG tablet Take 1 tablet (20 mg total) by mouth  daily. 90 tablet 1   Tiotropium Bromide-Olodaterol (STIOLTO RESPIMAT) 2.5-2.5 MCG/ACT AERS INHALE 2 PUFFS BY MOUTH INTO THE LUNGS DAILY 4 g 11   No current facility-administered medications for this visit.    REVIEW OF SYSTEMS:    10 Point review of Systems was done is negative except as noted above.   PHYSICAL EXAMINATION: ECOG PERFORMANCE STATUS: 1 - Symptomatic but completely ambulatory  . Vitals:   10/12/22 1115  BP: (!) 140/52  Pulse: 75  Resp: 18  Temp: 98.1 F (36.7 C)  SpO2: 97%     Filed Weights   10/12/22 1115  Weight: 167 lb 6.4 oz (75.9 kg)    .Body mass index is 31.63 kg/m.   GENERAL:alert, in no acute distress and comfortable SKIN: no acute rashes, no significant lesions EYES: conjunctiva are pink and non-injected, sclera anicteric OROPHARYNX: MMM, no exudates, no oropharyngeal erythema or ulceration NECK: supple, no JVD LYMPH:  no palpable lymphadenopathy in the cervical, axillary or inguinal regions LUNGS: clear to auscultation b/l with normal respiratory effort HEART: regular rate & rhythm ABDOMEN:  normoactive bowel sounds , non tender, not distended. Extremity: no pedal edema PSYCH: alert & oriented x 3 with fluent speech NEURO: no focal motor/sensory deficits   LABORATORY DATA:  I have reviewed the data as listed .    Latest Ref Rng & Units 10/12/2022    9:55 AM 10/05/2022   12:01 PM 09/28/2022    8:41 AM  CBC  WBC 4.0 - 10.5 K/uL 11.1  11.3  11.3   Hemoglobin 12.0 - 15.0 g/dL 16.1  09.6  04.5   Hematocrit 36.0 - 46.0 % 33.1  35.0  33.3   Platelets 150 - 400 K/uL 224  229  231    .    Latest Ref Rng & Units 10/12/2022    9:55 AM 10/05/2022   12:01 PM 09/28/2022    8:41 AM  CMP  Glucose 70 - 99 mg/dL 409  811  914   BUN 8 - 23 mg/dL 30  20  26    Creatinine 0.44 - 1.00 mg/dL 7.82  9.56  0.60   Sodium 135 - 145 mmol/L 139  137  140   Potassium 3.5 - 5.1 mmol/L 3.6  3.6  3.4   Chloride 98 - 111 mmol/L 101  101  105   CO2 22 - 32  mmol/L 31  28  29    Calcium 8.9 - 10.3 mg/dL 9.5  9.3  9.2   Total Protein 6.5 - 8.1 g/dL 6.6  6.1  6.6   Total Bilirubin 0.3 - 1.2 mg/dL 0.3  0.3  0.3   Alkaline Phos 38 - 126 U/L 46  48  46   AST 15 - 41 U/L 18  16  17    ALT 0 - 44 U/L 26  18  18     . Lab Results  Component Value Date   LDH 179 10/05/2022   Component     Latest Ref Rng 08/04/2022  Vitamin D, 25-Hydroxy     30 - 100 ng/mL 49.94   HIV Screen 4th Generation wRfx     Non Reactive  Non Reactive   Hepatitis B Surface Ag     NON REACTIVE  NON REACTIVE   Hep B Core Total Ab     NON REACTIVE  NON REACTIVE   HCV Ab     NON REACTIVE  NON REACTIVE      Needle core biopsy 07/27/2022:   Breast Ultrasound 07/20/2022:  Ultrasound guided biopsy:  Ultrasound guided biopsy:       RADIOGRAPHIC STUDIES: I have personally reviewed the radiological images as listed and agreed with the findings in the report. No results found.  ASSESSMENT & PLAN:   Wonderful 82 y.o. female with:  Newly-diagnosed low grade follicular lymphoma incidentally noted LNadenopathy on routine mammogram. History of asymptomatic stroke  Hypertension  Pure hypercholesterolemia  Contusion of right shoulder region  6. Acute DVT of left peroneal veins  PLAN:   -Discussed lab results on 10/12/2022 in detail with patient. CBC normal, showed WBC of 11.1K, hemoglobin of 11.0, and platelets of 224K. -CMP normal -LDH in upper limits of normal -Patient has had no major toxicity issues with last Rituxan treatment -proceed with day 1, cycle 4 of Rituxan today -continue Eliquis 5 mg twice daily for left peroneal DVT -continue to take vitamin D3 4000 IU regularly to boost immune system -she can proceed with Prolia shot scheduled foe July 2024 -will order CT scan in 2-3 months -discussed potential option of monitoring disease after completing 4 doses of treatment -discussed option of maintenance Rituxan after scans -reasonable for patient to continue  to take infection precautions and recommend to wear face masks in large crowds -informed patient that Prednisone may cause some mild leg swelling -advised patient to stay hydrated with at least 2L of water daily especially with warmer temperatures -answered all of patient's questions in detail  FOLLOW-UP: CT chest ABd pelvis in 12 weeks RTC with Dr Candise Che with labs in 14 weeks  The total time spent in the appointment was 30 minutes* .  All of the patient's questions were answered with apparent satisfaction. The patient knows to call the clinic with any problems, questions or concerns.   Wyvonnia Lora MD MS AAHIVMS [facility] Hematology/Oncology Physician [facility]  .*Total Encounter Time as defined by the Centers for Medicare and Medicaid Services includes, in addition to the face-to-face time of a patient visit (documented in the note above) non-face-to-face time: obtaining and reviewing outside history, ordering and reviewing medications, tests or procedures, care coordination (communications  with other health care professionals or caregivers) and documentation in the medical record.    I,Mitra Faeizi,acting as a Neurosurgeon for Wyvonnia Lora, MD.,have documented all relevant documentation on the behalf of Wyvonnia Lora, MD,as directed by  Wyvonnia Lora, MD while in the presence of Wyvonnia Lora, MD.  .I have reviewed the above documentation for accuracy and completeness, and I agree with the above. Heather Maine MD

## 2022-10-12 NOTE — Patient Instructions (Signed)
[facility]  Discharge Instructions: Thank you for choosing [facility] to provide your oncology and hematology care.   If you have a lab appointment with the Cancer Center, please go directly to the Cancer Center and check in at the registration area.   Wear comfortable clothing and clothing appropriate for easy access to any Portacath or PICC line.   We strive to give you quality time with your provider. You may need to reschedule your appointment if you arrive late (15 or more minutes).  Arriving late affects you and other patients whose appointments are after yours.  Also, if you miss three or more appointments without notifying the office, you may be dismissed from the clinic at the provider's discretion.      For prescription refill requests, have your pharmacy contact our office and allow 72 hours for refills to be completed.    Today you received the following chemotherapy and/or immunotherapy agents: rituximab      To help prevent nausea and vomiting after your treatment, we encourage you to take your nausea medication as directed.  BELOW ARE SYMPTOMS THAT SHOULD BE REPORTED IMMEDIATELY: *FEVER GREATER THAN 100.4 F (38 C) OR HIGHER *CHILLS OR SWEATING *NAUSEA AND VOMITING THAT IS NOT CONTROLLED WITH YOUR NAUSEA MEDICATION *UNUSUAL SHORTNESS OF BREATH *UNUSUAL BRUISING OR BLEEDING *URINARY PROBLEMS (pain or burning when urinating, or frequent urination) *BOWEL PROBLEMS (unusual diarrhea, constipation, pain near the anus) TENDERNESS IN MOUTH AND THROAT WITH OR WITHOUT PRESENCE OF ULCERS (sore throat, sores in mouth, or a toothache) UNUSUAL RASH, SWELLING OR PAIN  UNUSUAL VAGINAL DISCHARGE OR ITCHING   Items with * indicate a potential emergency and should be followed up as soon as possible or go to the Emergency Department if any problems should occur.  Please show the CHEMOTHERAPY ALERT CARD or IMMUNOTHERAPY ALERT CARD at  check-in to the Emergency Department and triage nurse.  Should you have questions after your visit or need to cancel or reschedule your appointment, please contact [facility]  Dept: [phone]  and follow the prompts.  Office hours are 8:00 a.m. to 4:30 p.m. Monday - Friday. Please note that voicemails left after 4:00 p.m. may not be returned until the following business day.  We are closed weekends and major holidays. You have access to a nurse at all times for urgent questions. Please call the main number to the clinic Dept: [phone] and follow the prompts.   For any non-urgent questions, you may also contact your provider using MyChart. We now offer e-Visits for anyone 18 and older to request care online for non-urgent symptoms. For details visit [email or web address].   Also download the MyChart app! Go to the app store, search "MyChart", open the app, select [facility], and log in with your MyChart username and password.   

## 2022-10-13 ENCOUNTER — Other Ambulatory Visit: Payer: Self-pay

## 2022-10-13 ENCOUNTER — Telehealth: Payer: Self-pay | Admitting: Hematology

## 2022-10-15 ENCOUNTER — Other Ambulatory Visit: Payer: Self-pay

## 2022-10-18 ENCOUNTER — Encounter: Payer: Self-pay | Admitting: Hematology

## 2022-11-02 ENCOUNTER — Telehealth: Payer: Self-pay | Admitting: *Deleted

## 2022-11-02 ENCOUNTER — Ambulatory Visit: Payer: Medicare PPO | Admitting: Family Medicine

## 2022-11-02 ENCOUNTER — Encounter: Payer: Self-pay | Admitting: Family Medicine

## 2022-11-02 VITALS — BP 126/58 | Ht 59.0 in | Wt 165.0 lb

## 2022-11-02 DIAGNOSIS — M810 Age-related osteoporosis without current pathological fracture: Secondary | ICD-10-CM | POA: Diagnosis not present

## 2022-11-02 NOTE — Progress Notes (Signed)
PCP: McDiarmid, Leighton Roach, MD  Subjective:   HPI: Patient is a 82 y.o. female here for osteoporosis.  Patient is here to transfer care from Dr. Cleophas Dunker for her osteoporosis.  She has been on prolia after 5+ years of treatment with zoledronic acid.  Tolerating prolia well without any issues.  Last dose was 04/26/22 so due at this point History of Hip, Spine, or Wrist Fracture: wrist fracture 20 years ago and shoulder fracture 3 years ago Heart disease or stroke: yes remotely Cancer: yes - currently being treated for lymphoma Kidney Disease: no Gastric/Peptic Ulcer: no Gastric bypass surgery: no Severe GERD: no History of seizures: no Age at Menopause: ~56 Calcium intake: yes but uncertain of dose Vitamin D intake: yes but uncertain of dose Hormone replacement therapy: yes Smoking history: former - 40 years Alcohol: none Exercise: none Major dental work in past year: no Parents with hip/spine fracture: no  Past Medical History:  Diagnosis Date   Actinic keratosis 04/26/2021   Cataracts, bilateral    Elevated cholesterol    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 Fontaine, OBGYN).   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   Sensorineural hearing loss (SNHL) of both ears    Patient wears biaural hearing aids   Stress incontinence, female    USI    Current Outpatient Medications on File Prior to Visit  Medication Sig Dispense Refill   albuterol (VENTOLIN HFA) 108 (90 Base) MCG/ACT inhaler Inhale 2 puffs into the lungs every 6 (six) hours as needed for wheezing or shortness of breath. 18 g 5   apixaban (ELIQUIS) 5 MG TABS tablet Take 1 tablet (5 mg total) by mouth 2 (two) times daily. 60 tablet 4    calcium carbonate (OSCAL) 1500 (600 Ca) MG TABS tablet Take 600 mg by mouth 2 (two) times daily.     Cholecalciferol (VITAMIN D3) 125 MCG (5000 UT) CAPS Take 125 mcg by mouth daily.     Cyanocobalamin (VITAMIN B-12 PO) Take 1 tablet by mouth daily.     denosumab (PROLIA) 60 MG/ML SOSY injection Inject 60 mg into the skin every 6 (six) months.     GEMTESA 75 MG TABS Take 1 tablet by mouth daily.     hydrochlorothiazide (HYDRODIURIL) 25 MG tablet Take 1 tablet (25 mg total) by mouth daily. 90 tablet 3   predniSONE (DELTASONE) 20 MG tablet Take 3 tablets (60 mg total) by mouth daily with breakfast. Take For 3 days prior to each Rituxan infusion to reduce risk of reaction 40 tablet 0   rosuvastatin (CRESTOR) 20 MG tablet Take 1 tablet (20 mg total) by mouth daily. 90 tablet 1   Tiotropium Bromide-Olodaterol (STIOLTO RESPIMAT) 2.5-2.5 MCG/ACT AERS INHALE 2 PUFFS BY MOUTH INTO THE LUNGS DAILY 4 g 11   No current facility-administered medications on file prior to visit.    Past Surgical History:  Procedure Laterality Date   BASAL CELL CARCINOMA EXCISION     CATARACT EXTRACTION     Cyst removed from chest     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"    Allergies  Allergen Reactions   Lisinopril Cough  Ephedrine-Guaifenesin Other (See Comments)    Unknown reaction   Rituximab Rash    Approximately half way into infusion pt. developed a red raised rash to right upper arm and right upper back with itching noted.  See progress note 09/20/22 for more information.    BP (!) 126/58   Ht 4\' 11"  (1.499 m)   Wt 165 lb (74.8 kg)   BMI 33.33 kg/m       No data to display              No data to display              Objective:  Physical Exam:  Gen: NAD, comfortable in exam room  Dexa 09/14/22 T scores: R fem neck -1.5, AP spine +0.40 Frax 19% major osteoporotic fracture, hip fracture 4.1% Labs 10/12/22: CBC with Hb 11.0, WBC 11.1 CMP with normal Ca at 9.5.  Glc 112, otherwise  normal 25-OH Vit D 49.94 on 08/04/22  Assessment & Plan:  1. Osteoporosis - high risk with history of wrist and shoulder fractures.  Frax score has improved with reclast treatment and has been doing well on prolia.  Due for next prolia now.  Vitamin D, calcium have been normal.  Continue with her supplementation.    Total visit time including documentation 30 minutes.

## 2022-11-02 NOTE — Telephone Encounter (Addendum)
Prolia VOB initiated via [email or web address]  Last OV: 11/02/22 Next OV:  Last Prolia inj: 04/26/2022 Next Prolia inj DUE: 10/25/2022  Prolia and administration are subject to a $35.00 copay, which contributes to a $3,300.00 out of pocket max ($573.00 met). No deductible or coinsurance applies. Only one copay applies per date of service.  The benefits provided on this Verification of Benefits form are Medical Benefits and are the patient's In-Network benefits for Prolia. If you would like Pharmacy Benefits for Prolia, please call [phone].

## 2022-11-07 ENCOUNTER — Ambulatory Visit: Payer: Medicare PPO | Admitting: Family Medicine

## 2022-11-07 DIAGNOSIS — M810 Age-related osteoporosis without current pathological fracture: Secondary | ICD-10-CM

## 2022-11-07 MED ORDER — DENOSUMAB 60 MG/ML ~~LOC~~ SOSY
60.0000 mg | PREFILLED_SYRINGE | Freq: Once | SUBCUTANEOUS | Status: AC
Start: 2022-11-07 — End: 2022-11-07
  Administered 2022-11-07: 60 mg via SUBCUTANEOUS

## 2022-11-07 NOTE — Telephone Encounter (Addendum)
Prolia PA initiated via CoverMyMeds. Key: [id number].  PA is approved from 09/10/22 to 04/18/23. Ref number: [id number].

## 2022-11-07 NOTE — Progress Notes (Unsigned)
 Patient given [place] prolia injection 60mg /ml in the right arm. Patient tolerated injection well without reaction at the injection site. Patient will schedule next injection, which is 6 months from today.

## 2022-11-09 ENCOUNTER — Other Ambulatory Visit: Payer: Self-pay

## 2022-11-14 ENCOUNTER — Other Ambulatory Visit: Payer: Self-pay | Admitting: Family Medicine

## 2022-11-14 DIAGNOSIS — Z8673 Personal history of transient ischemic attack (TIA), and cerebral infarction without residual deficits: Secondary | ICD-10-CM

## 2022-11-14 DIAGNOSIS — E7800 Pure hypercholesterolemia, unspecified: Secondary | ICD-10-CM

## 2022-12-07 IMAGING — CT CT ABD-PELV W/ CM
2 of 5 series · 15 of 46 positions shown, 17 images · IV contrast (APPLIED)
Comparison: None.

CLINICAL DATA: Abdominal pain

EXAM:
CT ABDOMEN AND PELVIS WITH CONTRAST
TECHNIQUE: Multidetector CT imaging of the abdomen and pelvis was performed
using the standard protocol following bolus administration of
intravenous contrast.

[Series 2: abd pel w · axial · 0.68mm/px · z∈[-512,-142]mm · 12 of 84 slices shown, 14 images]
[im 5/84  soft-tissue]
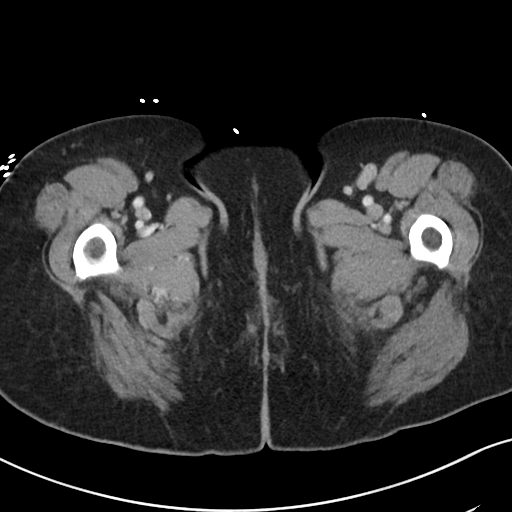
[im 5/84  bone]
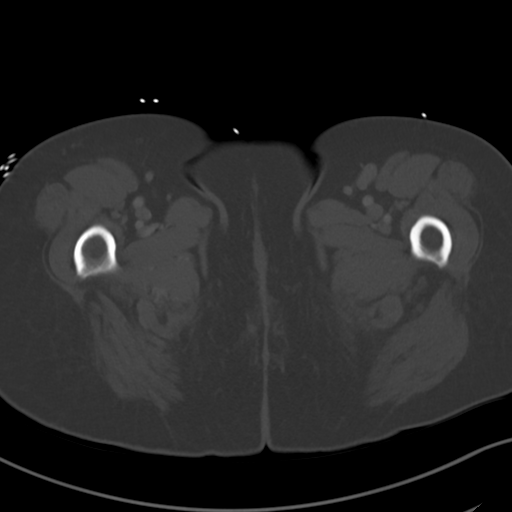
[im 14/84  soft-tissue]
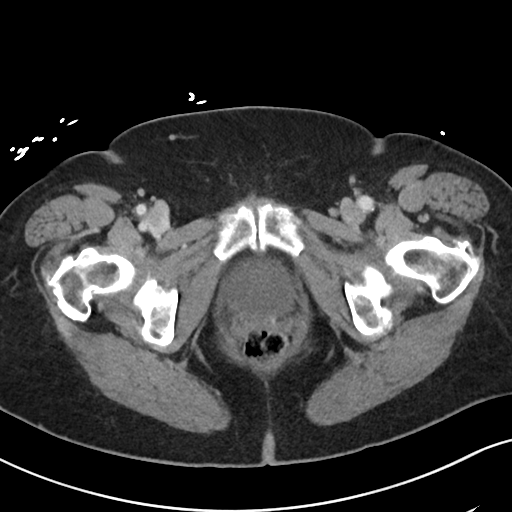
[im 19/84  soft-tissue]
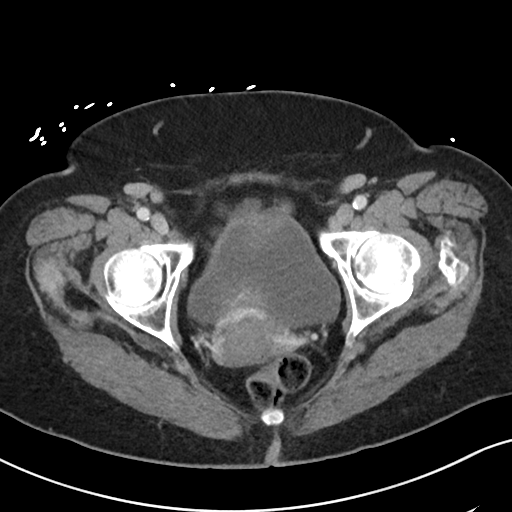
[im 24/84  soft-tissue]
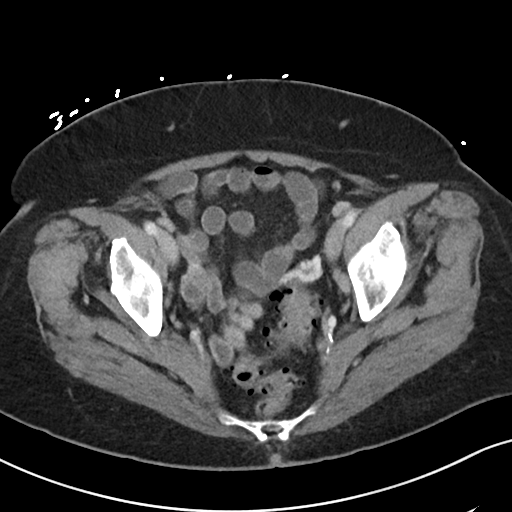
[im 33/84  soft-tissue]
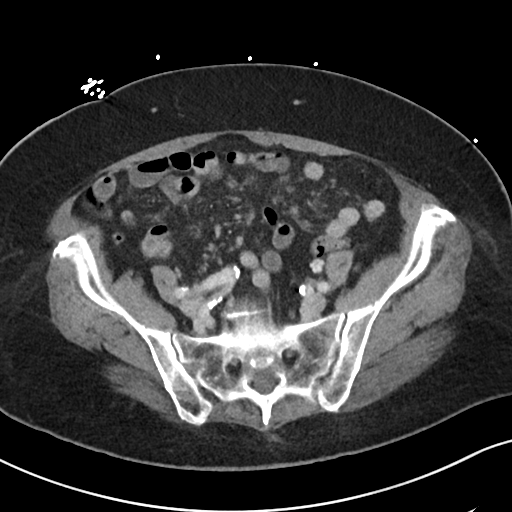
[im 37/84  soft-tissue]
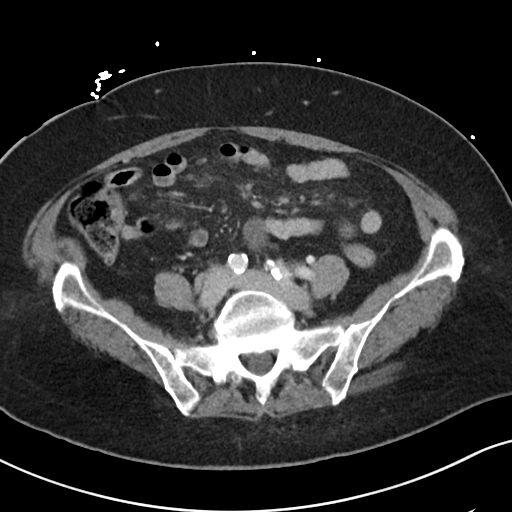
[im 47/84  soft-tissue]
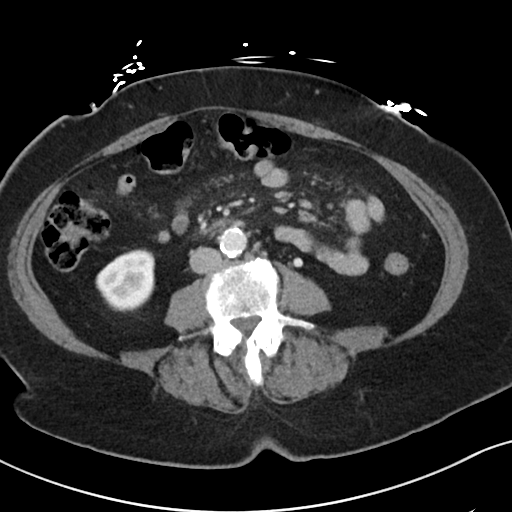
[im 51/84  soft-tissue]
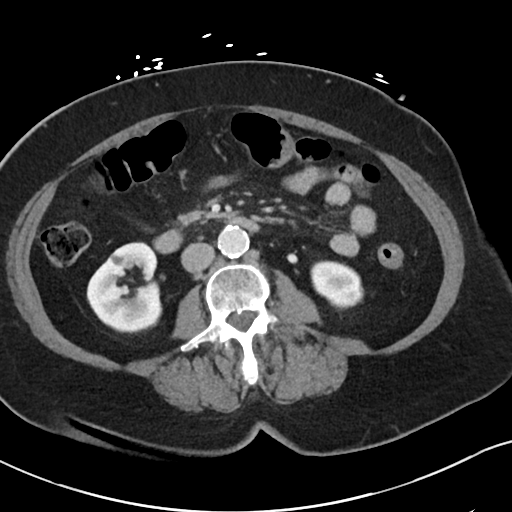
[im 60/84  soft-tissue]
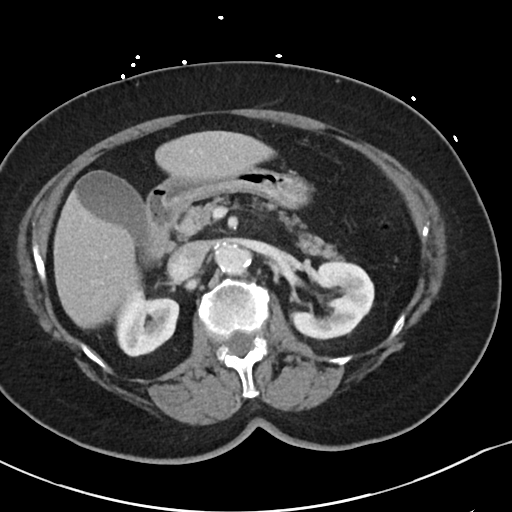
[im 60/84  bone]
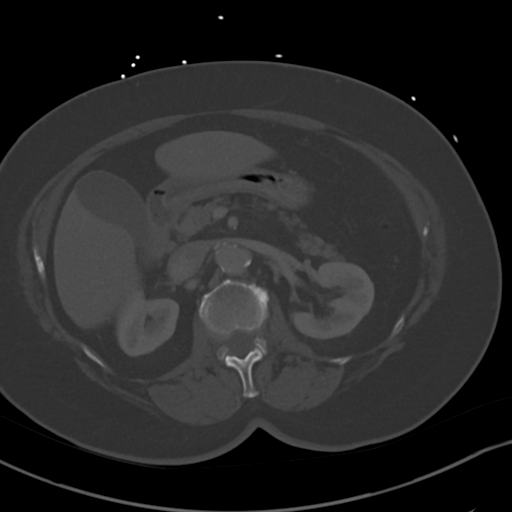
[im 65/84  soft-tissue]
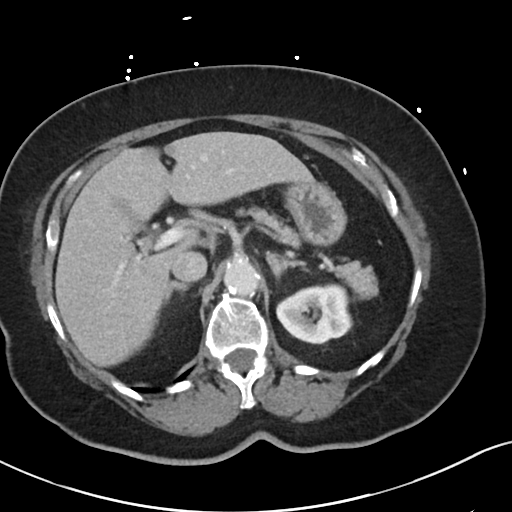
[im 70/84  soft-tissue]
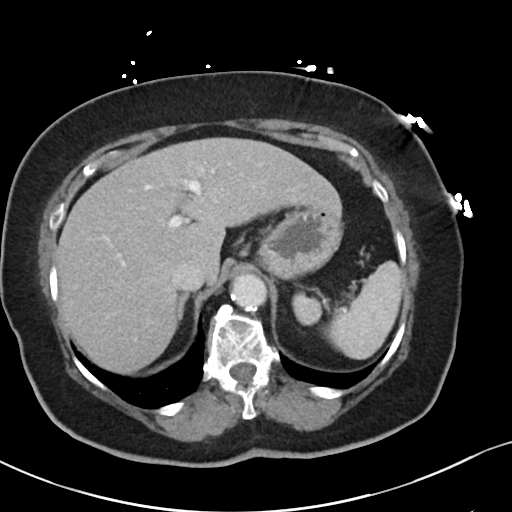
[im 79/84  soft-tissue]
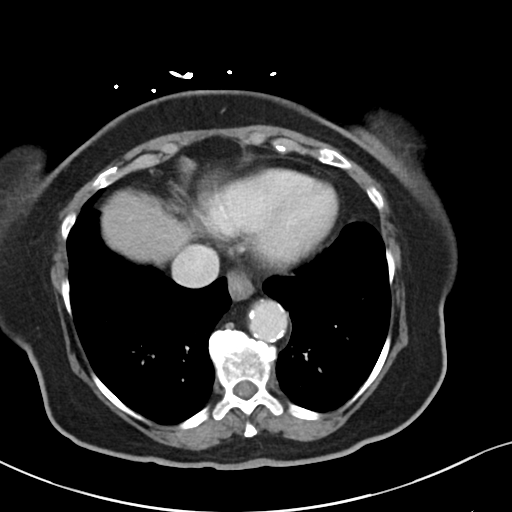

[Series 5: coronal · coronal · 0.76mm/px · 3 of 93 slices shown]
[im 31/93  soft-tissue]
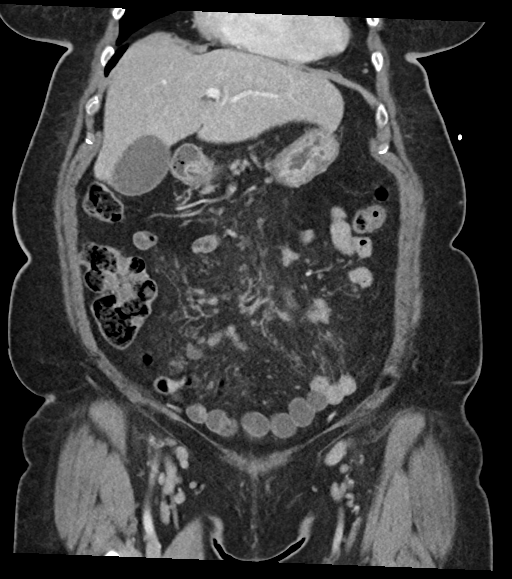
[im 41/93  soft-tissue]
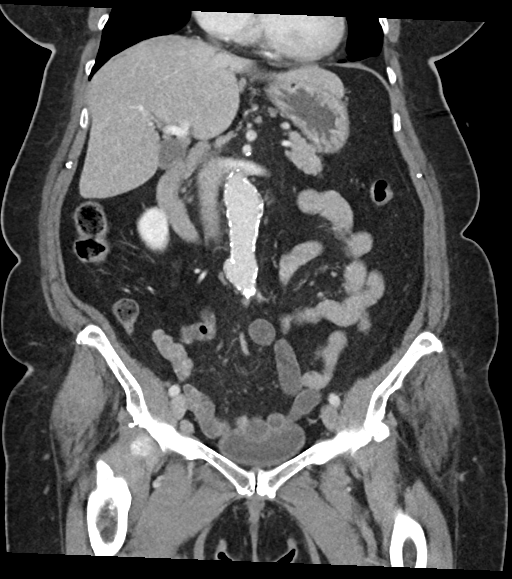
[im 52/93  soft-tissue]
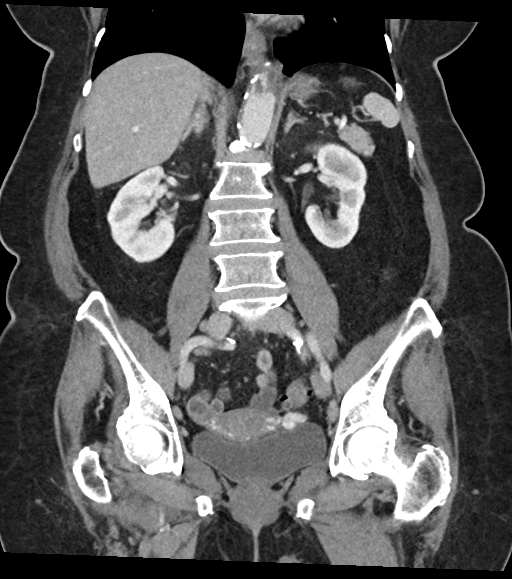

[15 of 46 positions shown; findings below may reference images not displayed]

RADIATION DOSE REDUCTION: This exam was performed according to the
departmental dose-optimization program which includes automated
exposure control, adjustment of the mA and/or kV according to
patient size and/or use of iterative reconstruction technique.

CONTRAST:  85mL OMNIPAQUE IOHEXOL 300 MG/ML  SOLN
FINDINGS: Lower chest: Unremarkable.

Hepatobiliary: There is fatty infiltration in the liver. Gallbladder
is unremarkable. There is no dilation of bile ducts.

Pancreas: There is slight prominence of pancreatic duct. No focal
abnormality is seen.

Spleen: Unremarkable.

Adrenals/Urinary Tract: Left adrenal is slightly more prominent in
size without discrete nodules. There is no hydronephrosis. There are
no renal or ureteral stones. Urinary bladder is not distended. There
are few low-density lesions in the right kidney each measuring less
than 8 mm suggesting possible renal cysts.

Stomach/Bowel: Small hiatal hernia is seen. Small bowel loops are
not dilated. Appendix is not dilated. There is no significant wall
thickening in the colon. Multiple diverticula are seen in the colon
without signs of focal acute diverticulitis.

Vascular/Lymphatic: Marked atherosclerotic changes are noted in the
aorta and its major branches. There is 3 cm aneurysm in the
infrarenal aorta. There is ectasia of infrarenal aorta closer to
bifurcation measuring 2.5 cm. There is stranding in the fat planes
in the mesentery without any loculated fluid collections. There are
subcentimeter mesenteric lymph nodes.

Reproductive: There is mild ectasia of vessels in the left adnexal
region. Left gonadal vein is patent.

Other: There is no ascites or pneumoperitoneum. Umbilical hernia
containing fat is seen. Small bilateral inguinal hernias containing
fat are noted.

Musculoskeletal: There is mild anterolisthesis at L4-L5 level.
Degenerative changes are noted with spinal stenosis and encroachment
of neural foramina at multiple levels, more so at L4-L5 level.
IMPRESSION: There is no evidence of intestinal obstruction or pneumoperitoneum.
There is no hydronephrosis. Appendix is not dilated.

There is stranding in the fat planes in the mesentery which may
suggest chronic nonspecific inflammation. There are subcentimeter
mesenteric lymph nodes.

Marked atherosclerotic changes are noted in the aorta. There is 3 cm
aneurysm in the infrarenal aorta.

Small hiatal hernia. Diverticulosis of colon. Fatty liver. Lumbar
spondylosis with spinal stenosis and encroachment of neural
foramina, particularly at L4-L5 level.

Other chronic findings as described in the body of the report.

## 2022-12-30 ENCOUNTER — Ambulatory Visit (INDEPENDENT_AMBULATORY_CARE_PROVIDER_SITE_OTHER): Payer: Medicare PPO

## 2022-12-30 VITALS — Ht 59.0 in | Wt 165.0 lb

## 2022-12-30 DIAGNOSIS — Z0000 Encounter for general adult medical examination without abnormal findings: Secondary | ICD-10-CM | POA: Diagnosis not present

## 2022-12-30 NOTE — Progress Notes (Signed)
Subjective:   Heather Andrews is a 82 y.o. female who presents for Medicare Annual (Subsequent) preventive examination.  Visit Complete: Virtual  I connected with  Heather Andrews on 12/30/22 by a audio enabled telemedicine application and verified that I am speaking with the correct person using two identifiers.  Patient Location: Home  Provider Location: Home Office  I discussed the limitations of evaluation and management by telemedicine. The patient expressed understanding and agreed to proceed.  Vital Signs: Because this visit was a virtual/telehealth visit, some criteria may be missing or patient reported. Any vitals not documented were not able to be obtained and vitals that have been documented are patient reported.   Cardiac Risk Factors include: advanced age (>8men, >71 women)     Objective:    Today's Vitals   12/30/22 1516  Weight: 165 lb (74.8 kg)  Height: 4\' 11"  (1.499 m)   Body mass index is 33.33 kg/m.     12/30/2022    3:24 PM 09/08/2022    9:50 AM 06/09/2022    9:38 AM 05/06/2022    3:10 PM 04/25/2022    7:49 PM 02/03/2022    3:28 PM 10/07/2021    8:57 AM  Advanced Directives  Does Patient Have a Medical Advance Directive? Yes No Yes Yes No No No  Type of Estate agent of [place];Living will  Living will Healthcare Power of [place];Living will     Does patient want to make changes to medical advance directive? No - Patient declined        Copy of Healthcare Power of Attorney in Chart? Yes - validated most recent copy scanned in chart (See row information)  Yes - validated most recent copy scanned in chart (See row information) Yes - validated most recent copy scanned in chart (See row information)     Would patient like information on creating a medical advance directive?      No - Patient declined No - Patient declined    Current Medications (verified) Outpatient Encounter Medications as of 12/30/2022  Medication Sig   albuterol  (VENTOLIN HFA) 108 (90 Base) MCG/ACT inhaler Inhale 2 puffs into the lungs every 6 (six) hours as needed for wheezing or shortness of breath.   apixaban (ELIQUIS) 5 MG TABS tablet Take 1 tablet (5 mg total) by mouth 2 (two) times daily.   calcium carbonate (OSCAL) 1500 (600 Ca) MG TABS tablet Take 600 mg by mouth 2 (two) times daily.   Cholecalciferol (VITAMIN D3) 125 MCG (5000 UT) CAPS Take 125 mcg by mouth daily.   Cyanocobalamin (VITAMIN B-12 PO) Take 1 tablet by mouth daily.   denosumab (PROLIA) 60 MG/ML SOSY injection Inject 60 mg into the skin every 6 (six) months.   GEMTESA 75 MG TABS Take 1 tablet by mouth daily.   hydrochlorothiazide (HYDRODIURIL) 25 MG tablet Take 1 tablet (25 mg total) by mouth daily.   predniSONE (DELTASONE) 20 MG tablet Take 3 tablets (60 mg total) by mouth daily with breakfast. Take For 3 days prior to each Rituxan infusion to reduce risk of reaction   rosuvastatin (CRESTOR) 20 MG tablet TAKE 1 TABLET BY MOUTH EVERY DAY   Tiotropium Bromide-Olodaterol (STIOLTO RESPIMAT) 2.5-2.5 MCG/ACT AERS INHALE 2 PUFFS BY MOUTH INTO THE LUNGS DAILY   No facility-administered encounter medications on file as of 12/30/2022.    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 Fontaine, OBGYN).   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   Sensorineural hearing loss (SNHL) of both ears    Patient wears biaural hearing aids   Stress incontinence, female    USI   Past Surgical History:  Procedure  Laterality Date   BASAL CELL CARCINOMA EXCISION     CATARACT EXTRACTION     Cyst removed from chest     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"   Family History  Problem Relation Age of Onset   Hypertension Mother    Heart disease Paternal Grandmother    Heart disease Paternal Grandfather    Cancer Brother        Leukemia   Social History   Socioeconomic History   Marital status: Widowed    Spouse name: Not on file   Number of children: 3   Years of education: 16   Highest education level: Not on file  Occupational History   Occupation: Tourist information centre manager    Comment: Retired  Tobacco Use   Smoking status: Former    Current packs/day: 0.50    Average packs/day: 0.5 packs/day for 66.7 years (33.3 ttl pk-yrs)    Types: Cigarettes    Start date: 04/1956   Smokeless tobacco: Never  Vaping Use   Vaping status: Never Used  Substance and Sexual Activity   Alcohol use: Yes    Comment: occassionally   Drug use: No   Sexual activity: Never    Comment: 1st intercourse 82 yo-Fewer than 5 partners  Other Topics Concern   Not on file  Social History Narrative   Widowed, lives alone though a daughter lives next door.   3 children: Heather Andrews; Heather Andrews, Heather Andrews   Activities: reading, interacting with her two cats, playing cards, corresponding with others (email, snail mail), needlework, cooking   Patient indicated on Geri Assessment questionnaire that she would want her daughter, Heather Andrews to make medical decisions on her behalf should she be unable. [phone]).  The patient gives permission to speak to Ms Romnet about issue around the patient's health.      Patient lives in a townhouse.  13 steps to get into home.  Home has two levels.    Assists Devices in home: Grab Bars   Regular exercise: Yoga once a week   Transportation: Patient drives   Social Determinants of Health   Financial Resource Strain: Low Risk  (12/30/2022)   Overall Financial Resource  Strain (CARDIA)    Difficulty of Paying Living Expenses: Not hard at all  Food Insecurity: No Food Insecurity (12/30/2022)   Hunger Vital Sign    Worried About Running Out of Food in the Last Year: Never true    Ran Out of Food in the Last Year: Never true  Transportation Needs: No Transportation Needs (12/30/2022)   PRAPARE - Administrator, Civil Service (Medical): No    Lack of Transportation (Non-Medical): No  Physical Activity: Insufficiently Active (12/30/2022)   Exercise Vital Sign    Days of Exercise per Week: 3  days    Minutes of Exercise per Session: 30 min  Stress: No Stress Concern Present (12/30/2022)   [employer] of Occupational Health - Occupational Stress Questionnaire    Feeling of Stress : Not at all  Social Connections: Moderately Integrated (12/30/2022)   Social Connection and Isolation Panel [NHANES]    Frequency of Communication with Friends and Family: More than three times a week    Frequency of Social Gatherings with Friends and Family: Three times a week    Attends Religious Services: More than 4 times per year    Active Member of Clubs or Organizations: Yes    Attends Banker Meetings: More than 4 times per year    Marital Status: Widowed    Tobacco Counseling Counseling given: Not Answered   Clinical Intake:  Pre-visit preparation completed: Yes  Pain : No/denies pain     Diabetes: No  How often do you need to have someone help you when you read instructions, pamphlets, or other written materials from your doctor or pharmacy?: 1 - Never  Interpreter Needed?: No  Information entered by :: Kandis Fantasia LPN   Activities of Daily Living    12/30/2022    3:24 PM  In your present state of health, do you have any difficulty performing the following activities:  Hearing? 0  Vision? 0  Difficulty concentrating or making decisions? 0  Walking or climbing stairs? 0  Dressing or bathing? 0  Doing errands, shopping? 0   Preparing Food and eating ? N  Using the Toilet? N  In the past six months, have you accidently leaked urine? N  Do you have problems with loss of bowel control? N  Managing your Medications? N  Managing your Finances? N  Housekeeping or managing your Housekeeping? N    Patient Care Team: McDiarmid, Leighton Roach, MD as PCP - General (Family Medicine) [name], MD as Consulting Physician (Hematology)  Indicate any recent Medical Services you may have received from other than [facility] providers in the past year (date may be approximate).     Assessment:   This is a routine wellness examination for [place].  Hearing/Vision screen Hearing Screening - Comments:: Hearing loss  Vision Screening - Comments:: up to date with routine eye exams with [facility]    Goals Addressed             This Visit's Progress    Remain active and independent        Depression Screen    12/30/2022    3:23 PM 09/08/2022    9:49 AM 08/04/2022    9:52 AM 06/09/2022    9:39 AM 05/06/2022    3:10 PM 02/03/2022    3:29 PM 10/07/2021    8:57 AM  PHQ 2/9 Scores  PHQ - 2 Score 0 0 0 0 0 0 0  PHQ- 9 Score  4  1 3 1  0    Fall Risk    12/30/2022    3:24 PM 09/08/2022    9:49 AM 06/09/2022    9:39 AM 05/06/2022    3:10 PM 02/03/2022    3:28 PM  Fall Risk   Falls in the past year? 0 0 1 1 0  Number falls in past yr: 0 0 0 0 0  Injury with Fall? 0 0 1 1 0  Risk for fall due to : No Fall Risks      Follow up Falls prevention discussed;Education provided;Falls evaluation completed  MEDICARE RISK AT HOME: Medicare Risk at Home Any stairs in or around the home?: Yes If so, are there any without handrails?: No Home free of loose throw rugs in walkways, pet beds, electrical cords, etc?: Yes Adequate lighting in your home to reduce risk of falls?: Yes Life alert?: No Use of a cane, walker or w/c?: No Grab bars in the bathroom?: Yes Shower chair or bench in shower?: No Elevated toilet  seat or a handicapped toilet?: Yes  TIMED UP AND GO:  Was the test performed?  No    Cognitive Function:      03/19/2021   12:54 PM  Montreal Cognitive Assessment   Visuospatial/ Executive (0/5) 5  Naming (0/3) 3  Attention: Read list of digits (0/2) 2  Attention: Read list of letters (0/1) 1  Attention: Serial 7 subtraction starting at 100 (0/3) 3  Language: Repeat phrase (0/2) 2  Language : Fluency (0/1) 0  Abstraction (0/2) 2  Delayed Recall (0/5) 5  Orientation (0/6) 6  Total 29  Adjusted Score (based on education) 29      12/30/2022    3:24 PM  6CIT Screen  What Year? 0 points  What month? 0 points  What time? 0 points  Count back from 20 0 points  Months in reverse 2 points  Repeat phrase 2 points  Total Score 4 points    Immunizations Immunization History  Administered Date(s) Administered   Fluad Quad(high Dose 65+) 03/18/2021, 02/03/2022   Influenza Split 01/17/2013   Influenza,inj,Quad PF,6+ Mos 02/26/2016, 04/03/2017, 04/25/2018, 01/25/2019   Influenza,inj,quad, With Preservative 01/09/2014, 02/19/2015, 01/16/2017   PFIZER(Purple Top)SARS-COV-2 Vaccination 06/16/2019, 07/10/2019, 02/01/2020   Pfizer Covid-19 Vaccine Bivalent Booster 76yrs & up 03/18/2021   Pneumococcal Conjugate-13 02/13/2014   Pneumococcal Polysaccharide-23 07/27/2006   Td 05/12/2016   Zoster Recombinant(Shingrix) 09/17/2021    TDAP status: Up to date  Flu Vaccine status: Due, Education has been provided regarding the importance of this vaccine. Advised may receive this vaccine at local pharmacy or Health Dept. Aware to provide a copy of the vaccination record if obtained from local pharmacy or Health Dept. Verbalized acceptance and understanding.  Pneumococcal vaccine status: Up to date  Covid-19 vaccine status: Information provided on how to obtain vaccines.   Qualifies for Shingles Vaccine? Yes   Zostavax completed No   Shingrix Completed?: No.    Education has been provided  regarding the importance of this vaccine. Patient has been advised to call insurance company to determine out of pocket expense if they have not yet received this vaccine. Advised may also receive vaccine at local pharmacy or Health Dept. Verbalized acceptance and understanding.  Screening Tests Health Maintenance  Topic Date Due   Zoster Vaccines- Shingrix (2 of 2) 11/12/2021   INFLUENZA VACCINE  11/17/2022   COVID-19 Vaccine (5 - 2023-24 season) 12/18/2022   Medicare Annual Wellness (AWV)  12/30/2023   DTaP/Tdap/Td (2 - Tdap) 05/12/2026   Pneumonia Vaccine 61+ Years old  Completed   DEXA SCAN  Completed   HPV VACCINES  Aged Out   Hepatitis C Screening  Discontinued    Health Maintenance  Health Maintenance Due  Topic Date Due   Zoster Vaccines- Shingrix (2 of 2) 11/12/2021   INFLUENZA VACCINE  11/17/2022   COVID-19 Vaccine (5 - 2023-24 season) 12/18/2022    Colorectal cancer screening: No longer required.   Mammogram status: No longer required due to age and preference.  Bone Density status: Completed 09/14/22. Results reflect: Bone density  results: OSTEOPENIA. Repeat every 2 years.  Lung Cancer Screening: (Low Dose CT Chest recommended if Age 54-80 years, 20 pack-year currently smoking OR have quit w/in 15years.) does not qualify.   Lung Cancer Screening Referral: n/a  Additional Screening:  Hepatitis C Screening: does not qualify  Vision Screening: Recommended annual ophthalmology exams for early detection of glaucoma and other disorders of the eye. Is the patient up to date with their annual eye exam?  Yes  Who is the provider or what is the name of the office in which the patient attends annual eye exams? [facility]  If pt is not established with a provider, would they like to be referred to a provider to establish care? No .   Dental Screening: Recommended annual dental exams for proper oral hygiene  Community Resource Referral / Chronic Care Management: CRR  required this visit?  No   CCM required this visit?  No     Plan:     I have personally reviewed and noted the following in the patient's chart:   Medical and social history Use of alcohol, tobacco or illicit drugs  Current medications and supplements including opioid prescriptions. Patient is not currently taking opioid prescriptions. Functional ability and status Nutritional status Physical activity Advanced directives List of other physicians Hospitalizations, surgeries, and ER visits in previous 12 months Vitals Screenings to include cognitive, depression, and falls Referrals and appointments  In addition, I have reviewed and discussed with patient certain preventive protocols, quality metrics, and best practice recommendations. A written personalized care plan for preventive services as well as general preventive health recommendations were provided to patient.     Kandis Fantasia [place], [place]   1/61/0960   After Visit Summary: (Mail) Due to this being a telephonic visit, the after visit summary with patients personalized plan was offered to patient via mail   Nurse Notes: No concerns at this time

## 2022-12-30 NOTE — Patient Instructions (Signed)
Heather Andrews , Thank you for taking time to come for your Medicare Wellness Visit. I appreciate your ongoing commitment to your health goals. Please review the following plan we discussed and let me know if I can assist you in the future.   Referrals/Orders/Follow-Ups/Clinician Recommendations: Aim for 30 minutes of exercise or brisk walking, 6-8 glasses of water, and 5 servings of fruits and vegetables each day.  This is a list of the screening recommended for you and due dates:  Health Maintenance  Topic Date Due   Zoster (Shingles) Vaccine (2 of 2) 11/12/2021   Flu Shot  11/17/2022   COVID-19 Vaccine (5 - 2023-24 season) 12/18/2022   Medicare Annual Wellness Visit  12/30/2023   DTaP/Tdap/Td vaccine (2 - Tdap) 05/12/2026   Pneumonia Vaccine  Completed   DEXA scan (bone density measurement)  Completed   HPV Vaccine  Aged Out   Hepatitis C Screening  Discontinued    Advanced directives: (In Chart) A copy of your advanced directives are scanned into your chart should your provider ever need it.  Next Medicare Annual Wellness Visit scheduled for next year: Yes

## 2023-01-07 ENCOUNTER — Other Ambulatory Visit: Payer: Self-pay | Admitting: Family Medicine

## 2023-01-10 ENCOUNTER — Ambulatory Visit (HOSPITAL_COMMUNITY)
Admission: RE | Admit: 2023-01-10 | Discharge: 2023-01-10 | Disposition: A | Discharge status: Home / Self Care | Payer: Medicare PPO | Source: Ambulatory Visit | Attending: Hematology | Admitting: Hematology

## 2023-01-10 DIAGNOSIS — C8290 Follicular lymphoma, unspecified, unspecified site: Secondary | ICD-10-CM | POA: Diagnosis not present

## 2023-01-10 DIAGNOSIS — C8294 Follicular lymphoma, unspecified, lymph nodes of axilla and upper limb: Secondary | ICD-10-CM | POA: Insufficient documentation

## 2023-01-10 DIAGNOSIS — I700 Atherosclerosis of aorta: Secondary | ICD-10-CM | POA: Diagnosis not present

## 2023-01-10 MED ORDER — IOHEXOL 300 MG/ML  SOLN
100.0000 mL | Freq: Once | INTRAMUSCULAR | Status: AC | PRN
  Administered 2023-01-10: 100 mL via INTRAVENOUS

## 2023-01-10 MED ORDER — IOHEXOL 9 MG/ML PO SOLN
ORAL | Status: AC
  Filled 2023-01-10: qty 1000

## 2023-01-10 MED ORDER — IOHEXOL 9 MG/ML PO SOLN
1000.0000 mL | ORAL | Status: AC
  Administered 2023-01-10: 1000 mL via ORAL

## 2023-01-12 ENCOUNTER — Ambulatory Visit: Payer: Medicare PPO | Admitting: Family Medicine

## 2023-01-12 ENCOUNTER — Encounter: Payer: Self-pay | Admitting: Family Medicine

## 2023-01-12 VITALS — BP 122/56 | HR 83 | Ht 59.0 in | Wt 170.4 lb

## 2023-01-12 DIAGNOSIS — I10 Essential (primary) hypertension: Secondary | ICD-10-CM

## 2023-01-12 DIAGNOSIS — E7800 Pure hypercholesterolemia, unspecified: Secondary | ICD-10-CM | POA: Diagnosis not present

## 2023-01-12 DIAGNOSIS — J4489 Other specified chronic obstructive pulmonary disease: Secondary | ICD-10-CM | POA: Diagnosis not present

## 2023-01-12 DIAGNOSIS — R7303 Prediabetes: Secondary | ICD-10-CM

## 2023-01-12 DIAGNOSIS — R739 Hyperglycemia, unspecified: Secondary | ICD-10-CM

## 2023-01-12 NOTE — Progress Notes (Signed)
Heather Andrews is alone Sources of clinical information for visit is/are patient and past medical records. Nursing assessment for this office visit was reviewed with the patient for accuracy and revision.     Previous Report(s) Reviewed: Review of last SM visit for Prolia, review last office visit with DR Candise Che (Onc)     01/12/2023    8:31 AM  Depression screen PHQ 2/9  Decreased Interest 0  Down, Depressed, Hopeless 0  PHQ - 2 Score 0  Altered sleeping 0  Tired, decreased energy 0  Change in appetite 0  Feeling bad or failure about yourself  0  Trouble concentrating 0  Moving slowly or fidgety/restless 0  Suicidal thoughts 0  PHQ-9 Score 0   Flowsheet Row Office Visit from 01/12/2023 in [facility] Office Visit from 09/08/2022 in [facility] Office Visit from 06/09/2022 in [facility]  Thoughts that you would be better off dead, or of hurting yourself in some way Not at all Not at all Not at all  PHQ-9 Total Score 0 4 1          01/12/2023    8:31 AM 12/30/2022    3:24 PM 09/08/2022    9:49 AM 06/09/2022    9:39 AM 05/06/2022    3:10 PM  Fall Risk   Falls in the past year? 0 0 0 1 1  Number falls in past yr: 0 0 0 0 0  Injury with Fall? 0 0 0 1 1  Risk for fall due to :  No Fall Risks     Follow up  Falls prevention discussed;Education provided;Falls evaluation completed          01/12/2023    8:31 AM 12/30/2022    3:23 PM 09/08/2022    9:49 AM  PHQ9 SCORE ONLY  PHQ-9 Total Score 0 0 4    There are no preventive care reminders to display for this patient.  Health Maintenance Due  Topic Date Due   Zoster Vaccines- Shingrix (2 of 2) 11/12/2021      History/P.E. limitations: none  There are no preventive care reminders to display for this patient. There are no preventive care reminders to display for this patient.  Health Maintenance Due  Topic Date Due   Zoster Vaccines- Shingrix (2 of 2) 11/12/2021      No chief complaint on file.    --------------------------------------------------------------------------------------------------------------------------------------------- Visit Problem List with A/P  No problem-specific Assessment & Plan notes found for this encounter.

## 2023-01-12 NOTE — Assessment & Plan Note (Addendum)
Established problem Tolerating rosuvastatin 20 mg daily.  No myalgias, no jaundice/RUQ pian Today LDL-D is 69, at goal of < 70. Recommend continuation of rosuvastatin 20 mg daily

## 2023-01-12 NOTE — Assessment & Plan Note (Signed)
Adequate symptom control. Tolerating Stiolto and albuterol medication. Continue current medication regiment.

## 2023-01-12 NOTE — Patient Instructions (Signed)
Your Blood pressure is under good control.  Keep taking your hydrochlorothiazide 25 mg daily.  We are checking your Cholesterol today to see if the rosuvastatin 20 mg daily tablet is decreasing the bad LDL cholesterol enough.   We are also checking your glycosylated hemoglobin (A1c) to she how well your body is handling sugar control .    If there has been a good reduction in the cholesterol, he will send your a message through your MyChart.  Otherwise he will contact you to discuss the results.      Please consider getting your Shingles vaccination  SHINGLES VACCINATION  Chickenpox and shingles are related because they are caused by the same virus (varicella-zoster virus). After a person recovers from chickenpox, the virus stays dormant (inactive) in the body. It can reactivate years later and cause shingles.  CDC recommends that adults 50 years and older get two doses of the shingles vaccine called Shingrix (recombinant zoster vaccine) to prevent shingles and the complications from the disease.  Shingrix vaccination provides strong protection against shingles. In adults 50 years Shingrix is more than 90% effective at preventing shingles.  Studies show that Shingrix is safe. The vaccine helps your body create a strong defense against shingles. As a result, you are likely to have temporary side effects from getting the shots. Some people feel they are having a mild flu without the head cold symptoms.  The side effects might affect your ability to do normal daily activities for 2 to 3 days.  Getting the shot when you have a couple days without commitments is a good idea, in case you do feel under the weather.  Medicare prescription drug plans (Part D) usually cover all available vaccines needed to prevent illness, like the shingles (Shingrix) shot. Contact your Medicare drug plan If you are uncertain if they will cover the shingles vaccination.   Your pharmacist can give you Shingrix as a  shot in your upper arm. You will need two Shingrix vaccinations to complete your inoculation against shingles.  The second injection is given at least 2 months after the first Shingrix vaccination was given.

## 2023-01-12 NOTE — Assessment & Plan Note (Addendum)
Established problem Weight up slightly.  BMI 33-34%. Intermittent use of prednisone with rituximab cycles Await A1c.

## 2023-01-12 NOTE — Assessment & Plan Note (Signed)
Established problem Well Controlled. Patient is at goal of SBP < 130. No signs of complications, medication side effects, or red flags. Continue current medications hydrochlorothiazide 25 mg daily

## 2023-01-13 LAB — LDL CHOLESTEROL, DIRECT: LDL Direct: 69 mg/dL (ref 0–99)

## 2023-01-16 ENCOUNTER — Other Ambulatory Visit: Payer: Self-pay

## 2023-01-16 DIAGNOSIS — C8294 Follicular lymphoma, unspecified, lymph nodes of axilla and upper limb: Secondary | ICD-10-CM

## 2023-01-17 ENCOUNTER — Inpatient Hospital Stay: Payer: Medicare PPO | Admitting: Hematology

## 2023-01-17 ENCOUNTER — Inpatient Hospital Stay: Payer: Medicare PPO | Attending: Hematology

## 2023-01-17 VITALS — BP 124/48 | HR 83 | Temp 97.5°F | Wt 170.5 lb

## 2023-01-17 DIAGNOSIS — C8294 Follicular lymphoma, unspecified, lymph nodes of axilla and upper limb: Secondary | ICD-10-CM | POA: Diagnosis not present

## 2023-01-17 DIAGNOSIS — Z79899 Other long term (current) drug therapy: Secondary | ICD-10-CM | POA: Insufficient documentation

## 2023-01-17 LAB — CMP (CANCER CENTER ONLY)
ALT: 11 U/L (ref 0–44)
AST: 16 U/L (ref 15–41)
Albumin: 4.2 g/dL (ref 3.5–5.0)
Alkaline Phosphatase: 40 U/L (ref 38–126)
Anion gap: 8 (ref 5–15)
BUN: 30 mg/dL — ABNORMAL HIGH (ref 8–23)
CO2: 30 mmol/L (ref 22–32)
Calcium: 9.6 mg/dL (ref 8.9–10.3)
Chloride: 102 mmol/L (ref 98–111)
Creatinine: 0.94 mg/dL (ref 0.44–1.00)
GFR, Estimated: >60 mL/min (ref >60)
Glucose, Bld: 131 mg/dL — ABNORMAL HIGH (ref 70–99)
Potassium: 3.7 mmol/L (ref 3.5–5.1)
Sodium: 140 mmol/L (ref 135–145)
Total Bilirubin: 0.4 mg/dL (ref 0.3–1.2)
Total Protein: 6.8 g/dL (ref 6.5–8.1)

## 2023-01-17 LAB — CBC WITH DIFFERENTIAL (CANCER CENTER ONLY)
Abs Immature Granulocytes: 0.04 10*3/uL (ref 0.00–0.07)
Basophils Absolute: 0.1 10*3/uL (ref 0.0–0.1)
Basophils Relative: 1 %
Eosinophils Absolute: 0.2 10*3/uL (ref 0.0–0.5)
Eosinophils Relative: 3 %
HCT: 37.2 % (ref 36.0–46.0)
Hemoglobin: 12.2 g/dL (ref 12.0–15.0)
Immature Granulocytes: 1 %
Lymphocytes Relative: 9 %
Lymphs Abs: 0.6 10*3/uL — ABNORMAL LOW (ref 0.7–4.0)
MCH: 31 pg (ref 26.0–34.0)
MCHC: 32.8 g/dL (ref 30.0–36.0)
MCV: 94.4 fL (ref 80.0–100.0)
Monocytes Absolute: 0.7 10*3/uL (ref 0.1–1.0)
Monocytes Relative: 11 %
Neutro Abs: 4.8 10*3/uL (ref 1.7–7.7)
Neutrophils Relative %: 75 %
Platelet Count: 226 10*3/uL (ref 150–400)
RBC: 3.94 MIL/uL (ref 3.87–5.11)
RDW: 13.9 % (ref 11.5–15.5)
WBC Count: 6.3 10*3/uL (ref 4.0–10.5)
nRBC: 0 % (ref 0.0–0.2)

## 2023-01-17 LAB — LACTATE DEHYDROGENASE: LDH: 180 U/L (ref 98–192)

## 2023-01-17 NOTE — Progress Notes (Signed)
HEMATOLOGY/ONCOLOGY CLINIC NOTE  Date of Service: 01/17/23  Patient Care Team: McDiarmid, Leighton Roach, MD as PCP - General (Family Medicine) [name], MD as Consulting Physician (Hematology)  CHIEF COMPLAINTS/PURPOSE OF CONSULTATION:  Evaluation and management of newly-diagnosed follicular lymphoma.  HISTORY OF PRESENTING ILLNESS:   Heather Andrews is a wonderful 82 y.o. female who has been referred to [place] by [name], MD for evaluation and management of newly-diagnosed low-grade follicular lymphoma.   Today, she is accompanied by her daughter. She reports that she has had right LE edema for about 6 months. Upon examination, patient has bilateral LE edema, right more than left.   She also reports having a rash at one point. Patient denies any medication changes, long distance travel, or unusual activity. Patient denies any redness/pain in the area.  Patient denies back pain, recent infections, abdominal pain, change in bowel habits, fever, chills, or night sweats.  She does report that she tripped and suffered a fall in January 2024, causing her to hit the side of her head.  Patient denies any breast discomfort or swelling besides in her LE. She does however sometimes experience tingling in her hands.  Patient does confirm having a silent stroke at some point previously, but is unsure of when it occurred.   Patient does report that she is "slowing down" in general, which she attributes to her age. Patient denies any sudden new fatigue or other major limiting medical issues at this time. Patient does regualrly take vitamin D, but is unsure of how many units she takes. She also takes vitamin K. Patient denies any concern for varicose veins in the past. Patient reports that she will be traveling to [facility] for a week and will leave May 4th.  INTERVAL HISTORY:  Heather Andrews is a 82 y.o. female here for continued evaluation and management of newly-diagnosed  follicular lymphoma.  Patient was last seen by me on 10/12/2022 and she was doing well overall.   Patient is accompanied by her daughter during this visit. She notes she has been doing well overall since our last visit. She complains of itchiness throughout her body. She denies any new detergent or any new changes.   Patient also complains of chronic fatigue, which has improved significantly since our last visit.   She denies any new infection issues, fever, chills, night sweats, unexpected weight loss, new lumps/bumps, chest pain, abdominal pain, back pain, She does report intermittent occasional leg swelling.   She regularly takes Vitamin-D supplement and B-Complex supplement. She is complaint with all of her medications.   MEDICAL HISTORY: . 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 Fontaine, OBGYN).   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   Sensorineural hearing loss (SNHL) of both ears    Patient wears biaural hearing aids   Stress incontinence, female    USI    SURGICAL HISTORY: Past Surgical History:  Procedure Laterality Date  BASAL CELL CARCINOMA EXCISION     CATARACT EXTRACTION     Cyst removed from chest     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"    SOCIAL HISTORY: Social History   Socioeconomic History   Marital status: Widowed    Spouse name: Not on file   Number of children: 3   Years of education: 40   Highest education level: Master's degree (e.g., MA, MS, MEng, MEd,  MSW, MBA)  Occupational History   Occupation: Tourist information centre manager    Comment: Retired  Tobacco Use   Smoking status: Former    Current packs/day: 0.50    Average packs/day: 0.5 packs/day for 66.7 years (33.4 ttl pk-yrs)    Types: Cigarettes    Start date: 04/1956   Smokeless tobacco: Never  Vaping Use   Vaping status: Never Used  Substance and Sexual Activity   Alcohol use: Yes    Comment: occassionally   Drug use: No   Sexual activity: Never    Comment: 1st intercourse 82 yo-Fewer than 5 partners  Other Topics Concern   Not on file  Social History Narrative   Widowed, lives alone though a daughter lives next door.   3 children: [name]; [name], Lawernce Pitts   Activities: reading, interacting with her two cats, playing cards, corresponding with others (email, snail mail), needlework, cooking   Patient indicated on Geri Assessment questionnaire that she would want her daughter, Clelia Croft to make medical decisions on her behalf should she be unable. [phone]).  The patient gives permission to speak to Ms Romnet about issue around the patient's health.      Patient lives in a townhouse.  13 steps to get into home.  Home has two levels.    Assists Devices in home: Grab Bars   Regular exercise: Yoga once a week   Transportation: Patient drives   Social Determinants of Health   Financial Resource Strain: Low Risk  (01/11/2023)   Overall Financial Resource Strain (CARDIA)    Difficulty of Paying Living Expenses: Not hard at all  Food Insecurity: No Food Insecurity (01/11/2023)   Hunger Vital Sign    Worried About Running Out of Food in the Last Year: Never true    Ran Out of Food in the Last Year: Never true  Transportation Needs: No Transportation Needs (01/11/2023)   PRAPARE - Administrator, Civil Service (Medical): No    Lack of Transportation (Non-Medical): No  Physical Activity: Insufficiently Active (01/11/2023)   Exercise Vital Sign     Days of Exercise per Week: 4 days    Minutes of Exercise per Session: 30 min  Stress: No Stress Concern Present (01/11/2023)   [employer] of Occupational Health - Occupational Stress Questionnaire    Feeling of Stress : Not at all  Social Connections: Moderately Integrated (01/11/2023)   Social Connection and Isolation Panel [NHANES]    Frequency of Communication with Friends and Family: More than three times a week    Frequency of Social Gatherings with Friends and Family: Twice a week    Attends Religious Services: More than 4 times per year    Active Member of [employer] or Organizations: Yes    Attends Banker Meetings: More than 4 times per year    Marital Status: Widowed  Intimate Partner Violence: Not At Risk (12/30/2022)   Humiliation, Afraid, Rape, and Kick questionnaire    Fear of Current or Ex-Partner: No  Emotionally Abused: No    Physically Abused: No    Sexually Abused: No    FAMILY HISTORY: Family History  Problem Relation Age of Onset   Hypertension Mother    Heart disease Paternal Grandmother    Heart disease Paternal Grandfather    Cancer Brother        Leukemia    ALLERGIES:  is allergic to lisinopril, ephedrine-guaifenesin, and rituximab.  MEDICATIONS:  Current Outpatient Medications  Medication Sig Dispense Refill   albuterol (VENTOLIN HFA) 108 (90 Base) MCG/ACT inhaler Inhale 2 puffs into the lungs every 6 (six) hours as needed for wheezing or shortness of breath. 18 g 5   apixaban (ELIQUIS) 5 MG TABS tablet Take 1 tablet (5 mg total) by mouth 2 (two) times daily. 60 tablet 4   calcium carbonate (OSCAL) 1500 (600 Ca) MG TABS tablet Take 600 mg by mouth 2 (two) times daily.     Cholecalciferol (VITAMIN D3) 125 MCG (5000 UT) CAPS Take 125 mcg by mouth daily.     Cyanocobalamin (VITAMIN B-12 PO) Take 1 tablet by mouth daily.     denosumab (PROLIA) 60 MG/ML SOSY injection Inject 60 mg into the skin every 6 (six) months.     GEMTESA 75 MG TABS  Take 1 tablet by mouth daily.     hydrochlorothiazide (HYDRODIURIL) 25 MG tablet Take 1 tablet (25 mg total) by mouth daily. 90 tablet 3   rosuvastatin (CRESTOR) 20 MG tablet TAKE 1 TABLET BY MOUTH EVERY DAY 90 tablet 3   Tiotropium Bromide-Olodaterol (STIOLTO RESPIMAT) 2.5-2.5 MCG/ACT AERS INHALE 2 PUFFS BY MOUTH INTO THE LUNGS DAILY 4 g 11   No current facility-administered medications for this visit.    REVIEW OF SYSTEMS:    10 Point review of Systems was done is negative except as noted above.   PHYSICAL EXAMINATION: ECOG PERFORMANCE STATUS: 1 - Symptomatic but completely ambulatory  . Vitals:   01/17/23 0928  BP: (!) 124/48  Pulse: 83  Temp: (!) 97.5 F (36.4 C)  SpO2: 94%   Filed Weights   01/17/23 0928  Weight: 170 lb 8 oz (77.3 kg)  .Body mass index is 34.44 kg/m.  GENERAL:alert, in no acute distress and comfortable SKIN: no acute rashes, no significant lesions EYES: conjunctiva are pink and non-injected, sclera anicteric OROPHARYNX: MMM, no exudates, no oropharyngeal erythema or ulceration NECK: supple, no JVD LYMPH:  no palpable lymphadenopathy in the cervical, axillary or inguinal regions LUNGS: clear to auscultation b/l with normal respiratory effort HEART: regular rate & rhythm ABDOMEN:  normoactive bowel sounds , non tender, not distended. Extremity: no pedal edema PSYCH: alert & oriented x 3 with fluent speech NEURO: no focal motor/sensory deficits   LABORATORY DATA:  I have reviewed the data as listed .    Latest Ref Rng & Units 01/17/2023    8:42 AM 10/12/2022    9:55 AM 10/05/2022   12:01 PM  CBC  WBC 4.0 - 10.5 K/uL 6.3  11.1  11.3   Hemoglobin 12.0 - 15.0 g/dL 78.2  95.6  21.3   Hematocrit 36.0 - 46.0 % 37.2  33.1  35.0   Platelets 150 - 400 K/uL 226  224  229    .    Latest Ref Rng & Units 01/17/2023    8:42 AM 10/12/2022    9:55 AM 10/05/2022   12:01 PM  CMP  Glucose 70 - 99 mg/dL 086  578  469   BUN 8 - 23 mg/dL 30  30  20    Creatinine 0.44 - 1.00 mg/dL 1.61  0.96  0.45   Sodium 135 - 145 mmol/L 140  139  137   Potassium 3.5 - 5.1 mmol/L 3.7  3.6  3.6   Chloride 98 - 111 mmol/L 102  101  101   CO2 22 - 32 mmol/L 30  31  28    Calcium 8.9 - 10.3 mg/dL 9.6  9.5  9.3   Total Protein 6.5 - 8.1 g/dL 6.8  6.6  6.1   Total Bilirubin 0.3 - 1.2 mg/dL 0.4  0.3  0.3   Alkaline Phos 38 - 126 U/L 40  46  48   AST 15 - 41 U/L 16  18  16    ALT 0 - 44 U/L 11  26  18     . Lab Results  Component Value Date   LDH 180 01/17/2023   Component     Latest Ref Rng 08/04/2022  Vitamin D, 25-Hydroxy     30 - 100 ng/mL 49.94   HIV Screen 4th Generation wRfx     Non Reactive  Non Reactive   Hepatitis B Surface Ag     NON REACTIVE  NON REACTIVE   Hep B Core Total Ab     NON REACTIVE  NON REACTIVE   HCV Ab     NON REACTIVE  NON REACTIVE      Needle core biopsy 07/27/2022:   Breast Ultrasound 07/20/2022:  Ultrasound guided biopsy:  Ultrasound guided biopsy:       RADIOGRAPHIC STUDIES: I have personally reviewed the radiological images as listed and agreed with the findings in the report. No results found.  ASSESSMENT & PLAN:   Wonderful 82 y.o. female with:  Recently-diagnosed low grade follicular lymphoma incidentally noted LNadenopathy on routine mammogram. History of asymptomatic stroke  Hypertension  Pure hypercholesterolemia  Contusion of right shoulder region  6. Acute DVT of left peroneal veins  PLAN:  -Discussed lab results from today, 01/17/2023, in detail with the patient. CBC is stable. CMP shows elevated glucose level of 131 and elevated BUN of 30. -CT Chest abdomen results  from 01/10/2023 showed  Minimal if any response to therapy within the chest, abdomen, or pelvis. Although some nodes measure smaller (anterior mediastinum and abdominal retroperitoneum), others are minimally enlarged (right axilla and small-bowel mesentery). Results were pending at the clinic visit and were available later at  10/3 due to delayed radiology reading. CT images were reviewed with the patient. -Discussed that the next plan is either going to be 1. Just continue to observe if lymph nodes are shrunk in size. or 2. Maintenance Rituxan treatment. We will decide the next plan after the official CT scan results.  -given that the LN have had limited response and she has relatively bulky and extensive involvement will give her the option for maintenance Rituxan. -Recommend influenza vaccine, COVID-19 Booster, RSV vaccine, and other age related vaccines.   -Patient has had no major toxicity issues with last Rituxan treatment -continue Eliquis 5 mg twice daily for left peroneal DVT -continue to take vitamin D3 4000 IU regularly to boost immune system -advised patient to stay hydrated with at least 2L of water daily especially with warmer temperatures -Answered all of patient's questions.   FOLLOW-UP: RTC with Dr Candise Che with labs in 4 months  The total time spent in the appointment was 30 minutes* .  All of the patient's questions were answered with apparent satisfaction. The patient knows to  call the clinic with any problems, questions or concerns.   Wyvonnia Lora MD MS AAHIVMS [facility] Hematology/Oncology Physician [facility]  .*Total Encounter Time as defined by the Centers for Medicare and Medicaid Services includes, in addition to the face-to-face time of a patient visit (documented in the note above) non-face-to-face time: obtaining and reviewing outside history, ordering and reviewing medications, tests or procedures, care coordination (communications with other health care professionals or caregivers) and documentation in the medical record.   I,Param Shah,acting as a Neurosurgeon for Wyvonnia Lora, MD.,have documented all relevant documentation on the behalf of Wyvonnia Lora, MD,as directed by  Wyvonnia Lora, MD while in the presence of Wyvonnia Lora, MD.  .I have reviewed the above documentation for  accuracy and completeness, and I agree with the above. [name] MD

## 2023-01-19 ENCOUNTER — Telehealth: Payer: Self-pay | Admitting: Hematology

## 2023-01-19 NOTE — Telephone Encounter (Signed)
Per 10/1 los left patient a message in regards to next schedule appointment times/dates

## 2023-01-20 ENCOUNTER — Other Ambulatory Visit: Payer: Self-pay

## 2023-01-22 ENCOUNTER — Encounter: Payer: Self-pay | Admitting: Hematology

## 2023-02-07 DIAGNOSIS — R350 Frequency of micturition: Secondary | ICD-10-CM | POA: Diagnosis not present

## 2023-02-07 DIAGNOSIS — N3946 Mixed incontinence: Secondary | ICD-10-CM | POA: Diagnosis not present

## 2023-02-20 NOTE — Progress Notes (Signed)
Was able to reach pt this time to let pt know per Dr Candise Che: her CT is stable to minimal response to the LN's. No significant new lesions/progression. WOuld give her option for maintenance Rituxan as discussed. Alternatively we can monitor her blood counts and symptoms.   Pt will monitor for now and discuss further with Dr Candise Che at next appt.

## 2023-02-28 NOTE — Telephone Encounter (Signed)
Last Prolia inj 11/07/22 Next Prolia inj due 05/11/23

## 2023-03-03 ENCOUNTER — Telehealth: Payer: Self-pay

## 2023-03-03 NOTE — Telephone Encounter (Signed)
Patient calls nurse line requesting a PT referral for balance.   She reports she would like to do PT for balance issues at [facility]. She reports a friend of hers is doing it and she feels this would be a good idea for her.  Patient advised insurance will require an office visit.   Patient scheduled with PCP for 11/21.

## 2023-03-03 NOTE — Telephone Encounter (Signed)
Reviewed and agree.

## 2023-03-09 ENCOUNTER — Ambulatory Visit: Payer: Medicare PPO | Admitting: Family Medicine

## 2023-03-09 ENCOUNTER — Encounter: Payer: Self-pay | Admitting: Family Medicine

## 2023-03-09 VITALS — BP 121/89 | HR 78 | Ht 59.0 in | Wt 169.2 lb

## 2023-03-09 DIAGNOSIS — R2689 Other abnormalities of gait and mobility: Secondary | ICD-10-CM

## 2023-03-09 NOTE — Patient Instructions (Signed)
A referral was sent to [facility] to work on your balance.  If you have not heard from Physical Therapy within five business day, please let my office know.

## 2023-03-09 NOTE — Progress Notes (Signed)
Heather Andrews is alone Sources of clinical information for visit is/are patient. Nursing assessment for this office visit was reviewed with the patient for accuracy and revision.     Previous Report(s) Reviewed: historical medical records Reviewed ED visit 06/24/20 and 04/25/22 where patient fell    03/09/2023    9:04 AM  Depression screen PHQ 2/9  Decreased Interest 0  Down, Depressed, Hopeless 0  PHQ - 2 Score 0  Altered sleeping 1  Tired, decreased energy 1  Change in appetite 1  Feeling bad or failure about yourself  0  Trouble concentrating 0  Moving slowly or fidgety/restless 0  Suicidal thoughts 0  PHQ-9 Score 3  Difficult doing work/chores Not difficult at all   [employer] Office Visit from 03/09/2023 in [facility] - A Dept Of [place]. [facility] Office Visit from 01/12/2023 in [facility] - A Dept Of [facility]. [facility] Office Visit from 09/08/2022 in [facility] - A Dept Of [facility]. [facility]  Thoughts that you would be better off dead, or of hurting yourself in some way Not at all Not at all Not at all  PHQ-9 Total Score 3 0 4          03/09/2023    9:04 AM 01/12/2023    8:31 AM 12/30/2022    3:24 PM 09/08/2022    9:49 AM 06/09/2022    9:39 AM  Fall Risk   Falls in the past year? 1 0 0 0 1  Number falls in past yr: 0 0 0 0 0  Injury with Fall? 1 0 0 0 1  Risk for fall due to :   No Fall Risks    Follow up   Falls prevention discussed;Education provided;Falls evaluation completed         03/09/2023    9:04 AM 01/12/2023    8:31 AM 12/30/2022    3:23 PM  PHQ9 SCORE ONLY  PHQ-9 Total Score 3 0 0    There are no preventive care reminders to display for this patient.  Health Maintenance Due  Topic Date Due   Zoster Vaccines- Shingrix (2 of 2) 11/12/2021      History/P.E. limitations: none  There are no preventive care reminders to display for this patient. There are no  preventive care reminders to display for this patient.  Health Maintenance Due  Topic Date Due   Zoster Vaccines- Shingrix (2 of 2) 11/12/2021     Chief Complaint  Patient presents with   Fall     --------------------------------------------------------------------------------------------------------------------------------------------- Visit Problem List with A/P  No problem-specific Assessment & Plan notes found for this encounter.

## 2023-03-09 NOTE — Progress Notes (Signed)
Maizie Upthegrove Greear is alone Sources of clinical information for visit is/are patient and past medical records. Nursing assessment for this office visit was reviewed with the patient for accuracy and revision.     Previous Report(s) Reviewed: ER records  06/24/20 and 04/25/22 involving falls    03/09/2023    9:04 AM  Depression screen PHQ 2/9  Decreased Interest 0  Down, Depressed, Hopeless 0  PHQ - 2 Score 0  Altered sleeping 1  Tired, decreased energy 1  Change in appetite 1  Feeling bad or failure about yourself  0  Trouble concentrating 0  Moving slowly or fidgety/restless 0  Suicidal thoughts 0  PHQ-9 Score 3  Difficult doing work/chores Not difficult at all   [employer] Office Visit from 03/09/2023 in [facility] - A Dept Of [place]. [facility] Office Visit from 01/12/2023 in [facility] - A Dept Of [facility]. [facility] Office Visit from 09/08/2022 in [facility] - A Dept Of [facility]. [facility]  Thoughts that you would be better off dead, or of hurting yourself in some way Not at all Not at all Not at all  PHQ-9 Total Score 3 0 4          03/09/2023    9:04 AM 01/12/2023    8:31 AM 12/30/2022    3:24 PM 09/08/2022    9:49 AM 06/09/2022    9:39 AM  Fall Risk   Falls in the past year? 1 0 0 0 1  Number falls in past yr: 0 0 0 0 0  Injury with Fall? 1 0 0 0 1  Risk for fall due to :   No Fall Risks    Follow up   Falls prevention discussed;Education provided;Falls evaluation completed         03/09/2023    9:04 AM 01/12/2023    8:31 AM 12/30/2022    3:23 PM  PHQ9 SCORE ONLY  PHQ-9 Total Score 3 0 0    There are no preventive care reminders to display for this patient.  Health Maintenance Due  Topic Date Due   Zoster Vaccines- Shingrix (2 of 2) 11/12/2021      History/P.E. limitations: none  There are no preventive care reminders to display for this patient. There are no preventive  care reminders to display for this patient.  Health Maintenance Due  Topic Date Due   Zoster Vaccines- Shingrix (2 of 2) 11/12/2021     Chief Complaint  Patient presents with   Fall     --------------------------------------------------------------------------------------------------------------------------------------------- Visit Problem List with A/P  Impairment of balance Established problem worsened.  Two episodes of falls in last 2 years, both with injuries.  2022 was injury to right shoulder and 04/25/22 with head strikning coffee table and injury to right shoulder and left thumb.    Ms Goughnour does worry about falling.   On 3-stance test Ms Birch failed the tandem stand. She had significant swaying with semi-tandem stance but did not lose balance.   Patient is not at goal of safe ambulation  Referral to Physical Therapy (Ortho) for impaired balance evaluation and treatment

## 2023-03-10 ENCOUNTER — Encounter: Payer: Self-pay | Admitting: Family Medicine

## 2023-03-10 DIAGNOSIS — R2689 Other abnormalities of gait and mobility: Secondary | ICD-10-CM | POA: Insufficient documentation

## 2023-03-10 NOTE — Assessment & Plan Note (Signed)
Established problem worsened.  Two episodes of falls in last 2 years, both with injuries.  2022 was injury to right shoulder and 04/25/22 with head strikning coffee table and injury to right shoulder and left thumb.    Heather Andrews does worry about falling.   On 3-stance test Heather Andrews failed the tandem stand. She had significant swaying with semi-tandem stance but did not lose balance.   Patient is not at goal of safe ambulation  Referral to Physical Therapy (Ortho) for impaired balance evaluation and treatment

## 2023-03-15 ENCOUNTER — Other Ambulatory Visit: Payer: Self-pay | Admitting: Family Medicine

## 2023-03-15 DIAGNOSIS — J4489 Other specified chronic obstructive pulmonary disease: Secondary | ICD-10-CM

## 2023-04-20 ENCOUNTER — Other Ambulatory Visit: Payer: Self-pay | Admitting: Hematology

## 2023-04-21 ENCOUNTER — Other Ambulatory Visit: Payer: Self-pay

## 2023-04-22 ENCOUNTER — Other Ambulatory Visit: Payer: Self-pay | Admitting: Family Medicine

## 2023-04-22 DIAGNOSIS — I10 Essential (primary) hypertension: Secondary | ICD-10-CM

## 2023-04-22 DIAGNOSIS — I639 Cerebral infarction, unspecified: Secondary | ICD-10-CM

## 2023-04-22 NOTE — Telephone Encounter (Signed)
 Prolia VOB initiated via [email or web address]  Next Prolia inj DUE: 05/11/23

## 2023-04-24 ENCOUNTER — Other Ambulatory Visit: Payer: Self-pay

## 2023-04-24 ENCOUNTER — Ambulatory Visit: Payer: Medicare PPO | Attending: Family Medicine | Admitting: Physical Therapy

## 2023-04-24 ENCOUNTER — Encounter: Payer: Self-pay | Admitting: Physical Therapy

## 2023-04-24 DIAGNOSIS — R2689 Other abnormalities of gait and mobility: Secondary | ICD-10-CM | POA: Insufficient documentation

## 2023-04-24 DIAGNOSIS — M6281 Muscle weakness (generalized): Secondary | ICD-10-CM | POA: Diagnosis not present

## 2023-04-24 DIAGNOSIS — R2681 Unsteadiness on feet: Secondary | ICD-10-CM | POA: Diagnosis not present

## 2023-04-24 NOTE — Therapy (Signed)
 OUTPATIENT PHYSICAL THERAPY NEURO EVALUATION   Patient Name: Heather Andrews MRN: [id number] DOB:26-Sep-1940, 83 y.o., female Today's Date: 04/24/2023   PCP: McDiarmid, Krystal BIRCH, MD  REFERRING PROVIDER: McDiarmid, Krystal BIRCH, MD  END OF SESSION:  PT End of Session - 04/24/23 0811     Visit Number 1    Number of Visits 9    Date for PT Re-Evaluation 05/26/23    Authorization Type [employer]    PT Start Time 0808    PT Stop Time 0850    PT Time Calculation (min) 42 min    Equipment Utilized During Treatment Gait belt    Activity Tolerance Patient tolerated treatment well    Behavior During Therapy WFL for tasks assessed/performed             Past Medical History:  Diagnosis Date   Actinic keratosis 04/26/2021   Acute deep vein thrombosis (DVT) of distal end of left lower extremity (HCC) 09/08/2022   Cataracts, bilateral    Elevated cholesterol    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 Fontaine, OBGYN).   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) 09/08/2022   Counseling regarding advance care planning and goals of care 09/08/2022   History of asymptomatic stroke  04/29/2022   Stress incontinence, female 04/04/2022   Osteoporosis 04/04/2022   Actinic keratosis 04/04/2022   COPD with asthma (HCC) 04/27/2021   Overactive bladder 04/26/2021   Hearing impairment 04/26/2021   Allergic rhinitis 04/26/2021   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  Have had two falls in past several years-tripped over my cat, and fell and broke shoulder.  Other fall was tripping on change of flooring and hit head.  Reach out to touch walls, furniture sometimes, very cautious going up and down curbs. Pt accompanied by: self  PERTINENT HISTORY: 2 falls in past 2 years; lymphoma, COPD; other PMH see above  PAIN:  Are you having pain? No  PRECAUTIONS: Fall  RED FLAGS: None   WEIGHT BEARING RESTRICTIONS: No  FALLS: Has patient fallen in last 6 months? No  LIVING ENVIRONMENT: Lives with: lives alone and daughter lives next door Lives in: House/apartment Stairs: Yes: Internal: 13 steps; can reach both Has following equipment at home:  walking sticks, walker from her mother  PLOF: Independent and Leisure: did exercises at [employer] prior to Covid  PATIENT GOALS: To address balance.  OBJECTIVE:  Note:  Objective measures were completed at Evaluation unless otherwise noted.  DIAGNOSTIC FINDINGS: NA  COGNITION: Overall cognitive status: Within functional limits for tasks assessed   SENSATION: Light touch: WFL  POSTURE: rounded shoulders  LOWER EXTREMITY ROM:   Active ROM WFL  LOWER EXTREMITY MMT:    MMT Right Eval Left Eval  Hip flexion 4 4  Hip extension    Hip abduction 4+ 4+  Hip adduction 4+ 4+  Hip internal rotation    Hip external rotation    Knee flexion 5 5  Knee extension 5 5  Ankle dorsiflexion 3+ 3+  Ankle plantarflexion    Ankle inversion    Ankle eversion    (Blank rows = not tested)   TRANSFERS: Assistive device utilized: None  Sit to stand: Modified independence Stand to sit: Modified independence  STAIRS: Level of Assistance: Modified independence Stair Negotiation Technique: Alternating Pattern  with Bilateral Rails Number of Stairs: 2-3  Height of Stairs: 4-6  Comments: Reports at home she sometimes does step-to pattern  GAIT: Gait pattern: step through pattern, trendelenburg, and narrow BOS Distance walked: 50 ft  Assistive device utilized: None Level of assistance: Modified independence   FUNCTIONAL TESTS:  5 times sit to stand: 13.5 sec Timed up and go (TUG): 13.65 sec 10 meter walk test: 13.03 sec = 2.51 ft/sec Functional gait assessment: 19/30 (Scores <22/30 indicate increased fall risk)   [facility] PT Assessment - 04/24/23 0823       Functional Gait  Assessment   Gait assessed  Yes    Gait Level Surface Walks 20 ft in less than 7 sec but greater than 5.5 sec, uses assistive device, slower speed, mild gait deviations, or deviates 6-10 in outside of the 12 in walkway width.   7.78 sec   Change in Gait Speed Able to smoothly change walking speed without loss of balance or gait deviation. Deviate no more than 6 in outside of the 12 in walkway width.    Gait with Horizontal Head Turns Performs head turns smoothly with slight change in  gait velocity (eg, minor disruption to smooth gait path), deviates 6-10 in outside 12 in walkway width, or uses an assistive device.   9.09   Gait with Vertical Head Turns Performs head turns with no change in gait. Deviates no more than 6 in outside 12 in walkway width.    Gait and Pivot Turn Pivot turns safely in greater than 3 sec and stops with no loss of balance, or pivot turns safely within 3 sec and  stops with mild imbalance, requires small steps to catch balance.    Step Over Obstacle Is able to step over one shoe box (4.5 in total height) without changing gait speed. No evidence of imbalance.    Gait with Narrow Base of Support Ambulates less than 4 steps heel to toe or cannot perform without assistance.    Gait with Eyes Closed Walks 20 ft, slow speed, abnormal gait pattern, evidence for imbalance, deviates 10-15 in outside 12 in walkway width. Requires more than 9 sec to ambulate 20 ft.   10.28   Ambulating Backwards Walks 20 ft, uses assistive device, slower speed, mild gait deviations, deviates 6-10 in outside 12 in walkway width.   24 sec   Steps Alternating feet, must use rail.    Total Score 19    FGA comment: Scores <22/30 indicate increased fall risk             M-CTSIB  Condition 1: Firm Surface, EO 30 Sec, Normal Sway  Condition 2: Firm Surface, EC 30 Sec, Moderate Sway  Condition 3: Foam Surface, EO 30 Sec, Mild Sway  Condition 4: Foam Surface, EC 30 Sec, Moderate and high guard arms  Sway                                                                                                                                TREATMENT DATE: 04/24/2023    PATIENT EDUCATION: Education details: Eval results, POC; initial HEP Person educated: Patient Education method: Explanation, Demonstration, and Handouts Education comprehension: verbalized understanding, returned demonstration, and needs further education  HOME EXERCISE PROGRAM: Access Code: [email or web address] URL:  [email or web address] Date: 04/24/2023 Prepared by: [facility] - Outpatient  Rehab - [facility]  Exercises - Narrow Stance with Counter Support  - 1 x daily - 7 x weekly - 1 sets - 3 reps - 30 sec hold - Heel Toe Raises with Counter Support  - 1 x daily - 7 x weekly - 1-2 sets - 10 reps  GOALS: Goals reviewed with patient? Yes  SHORT TERM GOALS: = LTGs   LONG TERM GOALS: Target date: 05/26/2023  Pt will be independent with HEP for improved balance and gait. Baseline:  Goal status: INITIAL  2.  Pt will improve FGA score to at least 22/30 to decrease fall risk. Baseline:  Goal status: INITIAL  3.  Pt will complete Conditions 2, 4 on MCTSIB with mild sway or less for improved balance. Baseline: Mod sway, high guard arms Goal status: INITIAL  4.  Pt will verbalize plans for continued community fitness upon d/c from PT. Baseline:  Goal status: INITIAL  ASSESSMENT:  CLINICAL IMPRESSION: Patient is an 83 y.o. female who was seen today for physical therapy evaluation and treatment for balance impairment.  She has history of 2 falls in the past 2 years, but none recently.  She reports overall caution to prevent falls.  She presents to [facility] today with  decreased balance, decreased strength, abnormal gait pattern; she is at increased fall risk per FGA score.  She would benefit from skilled PT to address the above deficits to decrease fall risk and improve overall functional mobility.  OBJECTIVE IMPAIRMENTS: Abnormal gait, decreased balance, decreased mobility, difficulty walking, decreased strength, and postural dysfunction.   ACTIVITY LIMITATIONS: standing, stairs, transfers, and locomotion level  PARTICIPATION LIMITATIONS: shopping, community activity, and travel  PERSONAL FACTORS: 3+ comorbidities: PMH above  are also affecting patient's functional outcome.   REHAB POTENTIAL: Good  CLINICAL DECISION MAKING: Evolving/moderate complexity  EVALUATION COMPLEXITY:  Moderate  PLAN:  PT FREQUENCY: 2x/week  PT DURATION: 4 weeks plus eval  PLANNED INTERVENTIONS: 97110-Therapeutic exercises, 97530- Therapeutic activity, 97112- Neuromuscular re-education, 97535- Self Care, 02859- Manual therapy, [phone]- Gait training, Patient/Family education, Balance training, and Stair training  PLAN FOR NEXT SESSION: Review initial HEP and continue to progress balance exercises-standing 4-way hip kicks for SLS, step taps/step ups, corner balance/compliant surfaces   Michele Judy W., PT 04/24/2023, 8:58 AM  [facility] at [facility] [address] [place], [place] [address] Phone # [phone] Fax # [phone]   Referring diagnosis? R26.89 (ICD-10-CM) - Impairment of balance  Treatment diagnosis? (if different than referring diagnosis) R26.81, R26.89, M62.81 What was this (referring dx) caused by? []  Surgery []  Fall [x]  Ongoing issue []  Arthritis []  Other: ____________  Laterality: []  Rt []  Lt [x]  Both  Check all possible CPT codes:  *CHOOSE 10 OR LESS*    See Planned Interventions listed in the Plan section of the Evaluation.

## 2023-05-01 ENCOUNTER — Encounter: Payer: Self-pay | Admitting: Physical Therapy

## 2023-05-01 ENCOUNTER — Ambulatory Visit: Payer: Medicare PPO | Admitting: Physical Therapy

## 2023-05-01 DIAGNOSIS — R2689 Other abnormalities of gait and mobility: Secondary | ICD-10-CM

## 2023-05-01 DIAGNOSIS — H353121 Nonexudative age-related macular degeneration, left eye, early dry stage: Secondary | ICD-10-CM | POA: Diagnosis not present

## 2023-05-01 DIAGNOSIS — H43811 Vitreous degeneration, right eye: Secondary | ICD-10-CM | POA: Diagnosis not present

## 2023-05-01 DIAGNOSIS — H35371 Puckering of macula, right eye: Secondary | ICD-10-CM | POA: Diagnosis not present

## 2023-05-01 DIAGNOSIS — H353211 Exudative age-related macular degeneration, right eye, with active choroidal neovascularization: Secondary | ICD-10-CM | POA: Diagnosis not present

## 2023-05-01 DIAGNOSIS — R2681 Unsteadiness on feet: Secondary | ICD-10-CM | POA: Diagnosis not present

## 2023-05-01 DIAGNOSIS — M6281 Muscle weakness (generalized): Secondary | ICD-10-CM

## 2023-05-01 DIAGNOSIS — Z961 Presence of intraocular lens: Secondary | ICD-10-CM | POA: Diagnosis not present

## 2023-05-01 NOTE — Therapy (Signed)
 OUTPATIENT PHYSICAL THERAPY NEURO TREATMENT NOTE   Patient Name: Heather Andrews MRN: [id number] DOB:01/07/1941, 83 y.o., female Today's Date: 05/01/2023   PCP: McDiarmid, Krystal BIRCH, MD  REFERRING PROVIDER: McDiarmid, Krystal BIRCH, MD  END OF SESSION:  PT End of Session - 05/01/23 0804     Visit Number 2    Number of Visits 9    Date for PT Re-Evaluation 05/26/23    Authorization Type [employer]    Authorization Time Period 05/01/2023-05/26/2023    Authorization - Visit Number 1    Authorization - Number of Visits 8    PT Start Time 0805    PT Stop Time 0844    PT Time Calculation (min) 39 min    Equipment Utilized During Treatment Gait belt    Activity Tolerance Patient tolerated treatment well    Behavior During Therapy WFL for tasks assessed/performed              Past Medical History:  Diagnosis Date   Actinic keratosis 04/26/2021   Acute deep vein thrombosis (DVT) of distal end of left lower extremity (HCC) 09/08/2022   Cataracts, bilateral    Elevated cholesterol    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 Fontaine, OBGYN).   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   Sensorineural hearing loss (SNHL) of both ears    Patient wears biaural hearing aids   Stress incontinence, female    USI   Past Surgical History:  Procedure Laterality Date   BASAL CELL CARCINOMA EXCISION     CATARACT EXTRACTION     Cyst removed from chest     EYE SURGERY     TO CORRECT LAZY EYE   Patient Active Problem List   Diagnosis Date Noted   Impairment of balance 03/10/2023   Follicular lymphoma of lymph nodes of  axilla (HCC) 09/08/2022   Counseling regarding advance care planning and goals of care 09/08/2022   History of asymptomatic stroke 04/29/2022   Stress incontinence, female 04/04/2022   Osteoporosis 04/04/2022   Actinic keratosis 04/04/2022   COPD with asthma (HCC) 04/27/2021   Overactive bladder 04/26/2021   Hearing impairment 04/26/2021   Allergic rhinitis 04/26/2021   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 03/19/2021   Prediabetes 03/18/2021   Vitamin D  deficiency 03/18/2021   Gastroesophageal reflux 03/18/2021   Primary osteoarthritis of left knee 07/03/2017   Essential hypertension    Pure hypercholesterolemia     ONSET DATE: 03/09/2023  REFERRING DIAG: R26.89 (ICD-10-CM) - Impairment of balance   THERAPY DIAG:  Unsteadiness on feet  Other abnormalities of gait and mobility  Muscle weakness (generalized)  Rationale for Evaluation and Treatment: Rehabilitation  SUBJECTIVE:  SUBJECTIVE STATEMENT: No changes, no pain.  Pt accompanied by: self  PERTINENT HISTORY: 2 falls in past 2 years; lymphoma, COPD; other PMH see above  PAIN:  Are you having pain? No  PRECAUTIONS: Fall  RED FLAGS: None   WEIGHT BEARING RESTRICTIONS: No  FALLS: Has patient fallen in last 6 months? No  LIVING ENVIRONMENT: Lives with: lives alone and daughter lives next door Lives in: House/apartment Stairs: Yes: Internal: 13 steps; can reach both Has following equipment at home:  walking sticks, walker from her mother  PLOF: Independent and Leisure: did exercises at [employer] prior to Covid  PATIENT GOALS: To address balance.  OBJECTIVE:   TODAY'S TREATMENT: 05/01/2023 Activity Comments  Reviewed HEP Good return demo-progressed BUE>1UE support  Romberg stance EC head  turns/nods x 5 Light BUE support  Standing hip abduction  Standing hip extension Standing marching 2 x 10 reps  Minisquats to up on toes x 10   Resisted sidestepping red band x 5 reps Cues for foot clearance  Forward/back walking along counter x 5 reps Narrow BOS  NuStep, Level 3, 4 extremities x 8 minutes for leg strengthening Last minute, BLEs only      Access Code: [email or web address] URL: [email or web address] Date: 05/01/2023 Prepared by: [facility] - Outpatient  Rehab - [facility]  Exercises - Narrow Stance with Counter Support  - 1 x daily - 7 x weekly - 1 sets - 3 reps - 30 sec hold (added head turns/nods EC) - Heel Toe Raises with Counter Support  - 1 x daily - 7 x weekly - 1-2 sets - 10 reps - Standing Hip Abduction with Counter Support  - 1 x daily - 5 x weekly - 3 sets - 10 reps - 3 sec hold - Standing Hip Extension with Counter Support  - 1 x daily - 5 x weekly - 3 sets - 10 reps - 3 sec hold - March in Place  - 1 x daily - 5 x weekly - 3 sets - 10 reps - Side Stepping with Resistance at Ankles and Counter Support  - 1 x daily - 5 x weekly - 1 sets - 5 reps  PATIENT EDUCATION: Education details: HEP additions Person educated: Patient Education method: Programmer, Multimedia, Demonstration, Verbal cues, and Handouts Education comprehension: verbalized understanding, returned demonstration, and needs further education  ----------------------------------------------------------- Note: Objective measures were completed at Evaluation unless otherwise noted.  DIAGNOSTIC FINDINGS: NA  COGNITION: Overall cognitive status: Within functional limits for tasks assessed   SENSATION: Light touch: WFL  POSTURE: rounded shoulders  LOWER EXTREMITY ROM:   Active ROM WFL  LOWER EXTREMITY MMT:    MMT Right Eval Left Eval  Hip flexion 4 4  Hip extension    Hip abduction 4+ 4+  Hip adduction 4+ 4+  Hip internal rotation    Hip external rotation    Knee flexion 5 5  Knee  extension 5 5  Ankle dorsiflexion 3+ 3+  Ankle plantarflexion    Ankle inversion    Ankle eversion    (Blank rows = not tested)   TRANSFERS: Assistive device utilized: None  Sit to stand: Modified independence Stand to sit: Modified independence  STAIRS: Level of Assistance: Modified independence Stair Negotiation Technique: Alternating Pattern  with Bilateral Rails Number of Stairs: 2-3  Height of Stairs: 4-6  Comments: Reports at home she sometimes does step-to pattern  GAIT: Gait pattern: step through pattern, trendelenburg, and narrow BOS Distance walked: 50 ft  Assistive device utilized:  None Level of assistance: Modified independence   FUNCTIONAL TESTS:  5 times sit to stand: 13.5 sec Timed up and go (TUG): 13.65 sec 10 meter walk test: 13.03 sec = 2.51 ft/sec Functional gait assessment: 19/30 (Scores <22/30 indicate increased fall risk)     M-CTSIB  Condition 1: Firm Surface, EO 30 Sec, Normal Sway  Condition 2: Firm Surface, EC 30 Sec, Moderate Sway  Condition 3: Foam Surface, EO 30 Sec, Mild Sway  Condition 4: Foam Surface, EC 30 Sec, Moderate and high guard arms  Sway                                                                                                                                TREATMENT DATE: 04/24/2023    PATIENT EDUCATION: Education details: Eval results, POC; initial HEP Person educated: Patient Education method: Explanation, Demonstration, and Handouts Education comprehension: verbalized understanding, returned demonstration, and needs further education  HOME EXERCISE PROGRAM: Access Code: [email or web address] URL: [email or web address] Date: 04/24/2023 Prepared by: [facility] - Outpatient  Rehab - [facility]  Exercises - Narrow Stance with Counter Support  - 1 x daily - 7 x weekly - 1 sets - 3 reps - 30 sec hold - Heel Toe Raises with Counter Support  - 1 x daily - 7 x weekly - 1-2 sets - 10 reps  GOALS: Goals  reviewed with patient? Yes  SHORT TERM GOALS: = LTGs   LONG TERM GOALS: Target date: 05/26/2023  Pt will be independent with HEP for improved balance and gait. Baseline:  Goal status: INITIAL  2.  Pt will improve FGA score to at least 22/30 to decrease fall risk. Baseline:  Goal status: INITIAL  3.  Pt will complete Conditions 2, 4 on MCTSIB with mild sway or less for improved balance. Baseline: Mod sway, high guard arms Goal status: INITIAL  4.  Pt will verbalize plans for continued community fitness upon d/c from PT. Baseline:  Goal status: INITIAL  ASSESSMENT:  CLINICAL IMPRESSION: Pt presents today without complaints; reports she did her exercises at home.  Skilled PT session focused on reviewing HEP and progressing exercises for balance and strength.  Pt needs UE support for balance exercises, increased sway with transition from BUE to 1 UE support.  She tolerates resisted sidestepping well.  Pt will continue to benefit from skilled PT towards goals for improved functional mobility and decreased fall risk.   OBJECTIVE IMPAIRMENTS: Abnormal gait, decreased balance, decreased mobility, difficulty walking, decreased strength, and postural dysfunction.   ACTIVITY LIMITATIONS: standing, stairs, transfers, and locomotion level  PARTICIPATION LIMITATIONS: shopping, community activity, and travel  PERSONAL FACTORS: 3+ comorbidities: PMH above  are also affecting patient's functional outcome.   REHAB POTENTIAL: Good  CLINICAL DECISION MAKING: Evolving/moderate complexity  EVALUATION COMPLEXITY: Moderate  PLAN:  PT FREQUENCY: 2x/week  PT DURATION: 4 weeks plus eval  PLANNED INTERVENTIONS: 97110-Therapeutic exercises, 97530- Therapeutic activity, [id number]- Neuromuscular re-education, 97535- Self Care,  02859- Manual therapy, [phone]- Gait training, Patient/Family education, Balance training, and Stair training  PLAN FOR NEXT SESSION:  Review additions to HEP and continue to  progress balance exercises-step taps/step ups, corner balance/compliant surfaces.  Consider adding resistance to standing hip exercises.   STARLET GREIG ORN., PT 05/01/2023, 8:49 AM  [facility] Outpatient Rehab at [facility] [address] [place], [address] [place], [place] [address] Phone # [phone] Fax # [phone]   Referring diagnosis? R26.89 (ICD-10-CM) - Impairment of balance  Treatment diagnosis? (if different than referring diagnosis) R26.81, R26.89, M62.81 What was this (referring dx) caused by? []  Surgery []  Fall [x]  Ongoing issue []  Arthritis []  Other: ____________  Laterality: []  Rt []  Lt [x]  Both  Check all possible CPT codes:  *CHOOSE 10 OR LESS*    See Planned Interventions listed in the Plan section of the Evaluation.

## 2023-05-02 ENCOUNTER — Other Ambulatory Visit: Payer: Self-pay

## 2023-05-03 ENCOUNTER — Ambulatory Visit: Payer: Medicare PPO | Admitting: Physical Therapy

## 2023-05-03 ENCOUNTER — Encounter: Payer: Self-pay | Admitting: Physical Therapy

## 2023-05-03 DIAGNOSIS — R2681 Unsteadiness on feet: Secondary | ICD-10-CM | POA: Diagnosis not present

## 2023-05-03 DIAGNOSIS — R2689 Other abnormalities of gait and mobility: Secondary | ICD-10-CM | POA: Diagnosis not present

## 2023-05-03 DIAGNOSIS — M6281 Muscle weakness (generalized): Secondary | ICD-10-CM | POA: Diagnosis not present

## 2023-05-03 NOTE — Therapy (Signed)
 OUTPATIENT PHYSICAL THERAPY NEURO TREATMENT NOTE   Patient Name: Heather Andrews MRN: [id number] DOB:09-14-1940, 83 y.o., female Today's Date: 05/03/2023   PCP: McDiarmid, Demetra Filter, MD  REFERRING PROVIDER: McDiarmid, Demetra Filter, MD  END OF SESSION:  PT End of Session - 05/03/23 0851     Visit Number 3    Number of Visits 9    Date for PT Re-Evaluation 05/26/23    Authorization Type [employer]    Authorization Time Period 05/01/2023-05/26/2023    Authorization - Visit Number 3    Authorization - Number of Visits 8    PT Start Time 0851    PT Stop Time 0931    PT Time Calculation (min) 40 min    Equipment Utilized During Treatment Gait belt    Activity Tolerance Patient tolerated treatment well    Behavior During Therapy [facility] for tasks assessed/performed               Past Medical History:  Diagnosis Date   Actinic keratosis 04/26/2021   Acute deep vein thrombosis (DVT) of distal end of left lower extremity (HCC) 09/08/2022   Cataracts, bilateral    Elevated cholesterol    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 Fontaine, OBGYN).   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) 09/08/2022   Counseling regarding advance care planning and goals of care 09/08/2022   History of asymptomatic stroke 04/29/2022   Stress incontinence, female 04/04/2022   Osteoporosis 04/04/2022   Actinic keratosis 04/04/2022   COPD with asthma (HCC) 04/27/2021   Overactive bladder 04/26/2021   Hearing impairment 04/26/2021   Allergic rhinitis 04/26/2021   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  Go to retina specialist tomorrow; may have to get shots.  Pt accompanied by: self  PERTINENT HISTORY: 2 falls in past 2 years; lymphoma, COPD; other PMH see above  PAIN:  Are you having pain? No  PRECAUTIONS: Fall  RED FLAGS: None   WEIGHT BEARING RESTRICTIONS: No  FALLS: Has patient fallen in last 6 months? No  LIVING ENVIRONMENT: Lives with: lives alone and daughter lives next door Lives in: House/apartment Stairs: Yes: Internal: 13 steps; can reach both Has following equipment at home:  walking sticks, walker from her mother  PLOF: Independent and Leisure: did exercises at [employer] prior to Covid  PATIENT GOALS: To address balance.  OBJECTIVE:    TODAY'S TREATMENT:  05/03/2023 Activity Comments  Reviewed HEP: Hip abduction, hip extension, marching, resisted sidestepping Good return demo  Resistance added to: Hip abduction x 10 Hip extension x 10 Marching x 10 Red band  Standing on Airex: Feet apart/feet together EO/EC head turns/nods Tandem stance 2 x 10 sec, no support, min guard 2 sets; at counter BUE support>1 UE support  Forward/back walking x 5 reps Tandem gait Tandem march Monster walk UE support, cues to slow pace and for foot clearance  SLS activities:   Step taps to cones Foot propped on 4" block, no UE support 2 x 10 reps R foot tends to knock over cones           Access Code: [email or web address] URL: [email or web address] Date: 05/01/2023>05/03/2023 (added resisted band to kicks, marching) Prepared by: [facility] - Outpatient  Rehab - [facility]  Exercises - Narrow Stance with Counter Support  - 1 x daily - 7 x weekly - 1 sets - 3 reps - 30 sec hold (added head turns/nods EC) - Heel Toe Raises with Counter Support  - 1 x daily - 7 x weekly - 1-2 sets - 10 reps - Standing Hip Abduction with Counter Support  - 1 x daily - 5 x weekly - 3 sets - 10 reps - 3 sec hold - Standing Hip Extension with Counter Support  - 1 x daily - 5 x weekly - 3 sets - 10 reps - 3 sec hold - March in Place  - 1 x daily - 5 x weekly - 3 sets - 10 reps - Side Stepping with Resistance at Ankles and Counter Support  - 1 x daily - 5 x weekly - 1 sets - 5 reps  PATIENT EDUCATION: Education details: HEP addition of bands Person educated: Patient Education method: Programmer, multimedia, Demonstration, Verbal cues, and Handouts Education comprehension: verbalized understanding, returned demonstration, and needs further education  ----------------------------------------------------------- Note: Objective measures were completed at Evaluation unless otherwise noted.  DIAGNOSTIC FINDINGS: NA  COGNITION: Overall cognitive status: Within functional limits for  tasks assessed   SENSATION: Light touch: WFL  POSTURE: rounded shoulders  LOWER EXTREMITY ROM:   Active ROM WFL  LOWER EXTREMITY MMT:    MMT Right Eval Left Eval  Hip flexion 4 4  Hip extension    Hip abduction 4+ 4+  Hip adduction 4+ 4+  Hip internal rotation    Hip external rotation    Knee flexion 5 5  Knee extension 5 5  Ankle dorsiflexion 3+ 3+  Ankle plantarflexion    Ankle inversion    Ankle eversion    (Blank rows = not tested)   TRANSFERS: Assistive device utilized: None  Sit to stand: Modified  independence Stand to sit: Modified independence  STAIRS: Level of Assistance: Modified independence Stair Negotiation Technique: Alternating Pattern  with Bilateral Rails Number of Stairs: 2-3  Height of Stairs: 4-6"  Comments: Reports at home she sometimes does step-to pattern  GAIT: Gait pattern: step through pattern, trendelenburg, and narrow BOS Distance walked: 50 ft  Assistive device utilized: None Level of assistance: Modified independence   FUNCTIONAL TESTS:  5 times sit to stand: 13.5 sec Timed up and go (TUG): 13.65 sec 10 meter walk test: 13.03 sec = 2.51 ft/sec Functional gait assessment: 19/30 (Scores <22/30 indicate increased fall risk)     M-CTSIB  Condition 1: Firm Surface, EO 30 Sec, Normal Sway  Condition 2: Firm Surface, EC 30 Sec, Moderate Sway  Condition 3: Foam Surface, EO 30 Sec, Mild Sway  Condition 4: Foam Surface, EC 30 Sec, Moderate and high guard arms  Sway                                                                                                                                TREATMENT DATE: 04/24/2023    PATIENT EDUCATION: Education details: Eval results, POC; initial HEP Person educated: Patient Education method: Explanation, Demonstration, and Handouts Education comprehension: verbalized understanding, returned demonstration, and needs further education  HOME EXERCISE PROGRAM: Access Code: [email or web address] URL:  [email or web address] Date: 04/24/2023 Prepared by: [facility] - Outpatient  Rehab - [facility]  Exercises - Narrow Stance with Counter Support  - 1 x daily - 7 x weekly - 1 sets - 3 reps - 30 sec hold - Heel Toe Raises with Counter Support  - 1 x daily - 7 x weekly - 1-2 sets - 10 reps  GOALS: Goals reviewed with patient? Yes  SHORT TERM GOALS: = LTGs   LONG TERM GOALS: Target date: 05/26/2023  Pt will be independent with HEP for improved balance and gait. Baseline:  Goal status: IN PROGRESS  2.  Pt will improve FGA score to at least 22/30 to decrease fall risk. Baseline:  Goal status: IN PROGRESS  3.  Pt will complete Conditions 2, 4 on MCTSIB with mild sway or less for improved balance. Baseline: Mod sway, high guard arms Goal status: IN PROGRESS  4.  Pt will verbalize plans for continued community fitness upon d/c from PT. Baseline:  Goal status: IN PROGRESS  ASSESSMENT:  CLINICAL IMPRESSION: Pt presents today with return review of HEP and pt demo good understanding. Skilled PT session focused on adding resisted band for hip stability exercises.  Also, worked on compliant surface, SLS, and dynamic balance. Pt needs UE support and tends to have decreased foot clearance and step length, especially with RLE.  Pt will continue to benefit from skilled PT towards goals for improved functional mobility and decreased fall risk.   OBJECTIVE IMPAIRMENTS: Abnormal gait, decreased balance, decreased mobility, difficulty walking, decreased strength, and postural dysfunction.   ACTIVITY LIMITATIONS: standing, stairs, transfers, and locomotion  level  PARTICIPATION LIMITATIONS: shopping, community activity, and travel  PERSONAL FACTORS: 3+ comorbidities: PMH above  are also affecting patient's functional outcome.   REHAB POTENTIAL: Good  CLINICAL DECISION MAKING: Evolving/moderate complexity  EVALUATION COMPLEXITY: Moderate  PLAN:  PT FREQUENCY: 2x/week  PT  DURATION: 4 weeks plus eval  PLANNED INTERVENTIONS: 97110-Therapeutic exercises, 97530- Therapeutic activity, 97112- Neuromuscular re-education, 97535- Self Care, 28315- Manual therapy, [phone]- Gait training, Patient/Family education, Balance training, and Stair training  PLAN FOR NEXT SESSION:  Work on standing hip strengthening with weights in clinic.  Continue to progress balance exercises-step taps/step ups, corner balance/compliant surfaces.     Kelsey Patricia., PT 05/03/2023, 9:35 AM  [facility] at [facility] [address] [place], [address] [place], [place] [address] Phone # [phone] Fax # [phone]   Referring diagnosis? R26.89 (ICD-10-CM) - Impairment of balance  Treatment diagnosis? (if different than referring diagnosis) R26.81, R26.89, M62.81 What was this (referring dx) caused by? []  Surgery []  Fall [x]  Ongoing issue []  Arthritis []  Other: ____________  Laterality: []  Rt []  Lt [x]  Both  Check all possible CPT codes:  *CHOOSE 10 OR LESS*    See Planned Interventions listed in the Plan section of the Evaluation.

## 2023-05-04 DIAGNOSIS — H353122 Nonexudative age-related macular degeneration, left eye, intermediate dry stage: Secondary | ICD-10-CM | POA: Diagnosis not present

## 2023-05-04 DIAGNOSIS — Z961 Presence of intraocular lens: Secondary | ICD-10-CM | POA: Diagnosis not present

## 2023-05-04 DIAGNOSIS — H43813 Vitreous degeneration, bilateral: Secondary | ICD-10-CM | POA: Diagnosis not present

## 2023-05-04 DIAGNOSIS — H31091 Other chorioretinal scars, right eye: Secondary | ICD-10-CM | POA: Diagnosis not present

## 2023-05-04 DIAGNOSIS — H353211 Exudative age-related macular degeneration, right eye, with active choroidal neovascularization: Secondary | ICD-10-CM | POA: Diagnosis not present

## 2023-05-04 DIAGNOSIS — H53032 Strabismic amblyopia, left eye: Secondary | ICD-10-CM | POA: Diagnosis not present

## 2023-05-04 NOTE — Telephone Encounter (Addendum)
Ready for scheduling on 05/11/23.  I called [employer]:   Prolia and administration are subject to a $35.00 copay, which contributes to a $3,300.00 out of pocket max ($0 met). No deductible or coinsurance applies. Only one copay applies per date of service.  The benefits provided on this Verification of Benefits form are Medical Benefits and are the patient's In-Network benefits for Prolia. PA initiated via CoverMyMeds. Key: [id number] [employer] PA is approved and valid from 09/09/21 to 04/17/24. Ref #: [id number].

## 2023-05-08 ENCOUNTER — Ambulatory Visit: Payer: Medicare PPO | Admitting: Physical Therapy

## 2023-05-08 ENCOUNTER — Encounter: Payer: Self-pay | Admitting: Physical Therapy

## 2023-05-08 DIAGNOSIS — R2681 Unsteadiness on feet: Secondary | ICD-10-CM | POA: Diagnosis not present

## 2023-05-08 DIAGNOSIS — R2689 Other abnormalities of gait and mobility: Secondary | ICD-10-CM | POA: Diagnosis not present

## 2023-05-08 DIAGNOSIS — M6281 Muscle weakness (generalized): Secondary | ICD-10-CM | POA: Diagnosis not present

## 2023-05-08 NOTE — Therapy (Signed)
OUTPATIENT PHYSICAL THERAPY NEURO TREATMENT NOTE   Patient Name: Heather Andrews MRN: [id number] DOB:03/23/1941, 83 y.o., female Today's Date: 05/08/2023   PCP: McDiarmid, Leighton Roach, MD  REFERRING PROVIDER: McDiarmid, Leighton Roach, MD  END OF SESSION:  PT End of Session - 05/08/23 0850     Visit Number 4    Number of Visits 9    Date for PT Re-Evaluation 05/26/23    Authorization Type [employer]    Authorization Time Period 05/01/2023-05/26/2023    Authorization - Visit Number 4    Authorization - Number of Visits 8    PT Start Time 0849    PT Stop Time 0930    PT Time Calculation (min) 41 min    Equipment Utilized During Treatment Gait belt    Activity Tolerance Patient tolerated treatment well    Behavior During Therapy WFL for tasks assessed/performed                Past Medical History:  Diagnosis Date   Actinic keratosis 04/26/2021   Acute deep vein thrombosis (DVT) of distal end of left lower extremity (HCC) 09/08/2022   Cataracts, bilateral    Elevated cholesterol    Frozen right shoulder 04/26/2021   History of shingles    Hypertension    Lymph node enlargement groins, bilaterally 07/18/2022   Found on DVT US 07/2022     Osteopenia 02/2017   T score -1.6 FRAX 17% / 3.3% stable from prior DEXA   Osteoporosis    DEXA 04/30/19: OSTEOPENIA DEXA 2018 T score -1.6 started on Prolia due to increased FRAX and subsequently switched to Reclast x5 years (T. Fontaine, OBGYN).   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) 09/08/2022   Counseling regarding advance care planning and goals of care 09/08/2022   History of asymptomatic stroke 04/29/2022   Stress incontinence, female 04/04/2022   Osteoporosis 04/04/2022   Actinic keratosis 04/04/2022   COPD with asthma (HCC) 04/27/2021   Overactive bladder 04/26/2021   Hearing impairment 04/26/2021   Allergic rhinitis 04/26/2021   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  Was a little stiff after last session.  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  Pt accompanied by: self  PERTINENT HISTORY: 2 falls in past 2 years; lymphoma, COPD; other PMH see above  PAIN:  Are you having pain? No  PRECAUTIONS: Fall  RED FLAGS: None   WEIGHT BEARING RESTRICTIONS: No  FALLS: Has patient fallen in last 6 months? No  LIVING ENVIRONMENT: Lives with: lives alone and daughter lives next door Lives in: House/apartment Stairs: Yes: Internal: 13 steps; can reach both Has following equipment at home:  walking sticks, walker from her mother  PLOF: Independent and Leisure: did exercises at [employer] prior to Covid  PATIENT GOALS: To address balance.  OBJECTIVE:    TODAY'S TREATMENT:  05/08/2023 Activity Comments  Hip abduction Hip extension Marching in place 2 x 10, 2#  Sidestepping 2 x 10, 2#  Heel/toe raises 2 x 10   SLS, 3 x 10" Tandem stance 2 x 15" Light UE support  Forward/back Tandem gait Tandem march Monster walk 3 reps, at [employer] on Airex: Feet apart/feet together EO/EC head turns/nods Tandem stance 2 x 15 sec Side step off/on Airex x 10 Forward step off/on Airex x 10 Light UE support throughout           Access Code: [email or web address] URL: [email or web address] Date: 05/08/2023 Prepared by: [facility] - Outpatient  Rehab - [facility]  Exercises - Narrow Stance with Counter Support  - 1 x daily - 7 x weekly - 1 sets - 3 reps - 30 sec hold - Heel Toe Raises with Counter Support  - 1 x daily - 7 x weekly - 1-2 sets - 10 reps - Standing Hip Abduction with Counter Support  - 1 x daily - 5 x weekly - 3 sets - 10 reps - 3 sec hold - Standing Hip Extension with Counter Support  - 1 x daily - 5 x weekly - 3 sets - 10 reps - 3 sec hold - March in Place  - 1 x daily - 5 x weekly - 3 sets - 10 reps  05/03/2023 (added resisted band to kicks, marching) - Side Stepping with Resistance at Ankles and Counter Support  - 1 x daily - 5 x weekly - 1 sets - 5 reps - Romberg Stance on Foam Pad  - 1 x daily - 7 x weekly - 3 sets - 5 reps - Romberg Stance Eyes Closed on Foam Pad  - 1 x daily - 7 x weekly - 3 sets - 5 reps   PATIENT EDUCATION: Education details: HEP addition -see above Person educated: Patient Education method: Explanation, Demonstration, Verbal cues, and Handouts Education comprehension: verbalized understanding, returned demonstration, and needs further education  ----------------------------------------------------------- Note: Objective measures were completed at Evaluation unless otherwise noted.  DIAGNOSTIC FINDINGS: NA  COGNITION: Overall cognitive status: Within functional limits for tasks assessed   SENSATION: Light  touch: WFL  POSTURE: rounded shoulders  LOWER EXTREMITY ROM:   Active ROM WFL  LOWER EXTREMITY MMT:    MMT Right Eval Left Eval  Hip flexion 4 4  Hip extension    Hip abduction 4+ 4+  Hip adduction 4+ 4+  Hip internal rotation    Hip external rotation    Knee flexion 5 5  Knee extension 5 5  Ankle dorsiflexion 3+ 3+  Ankle plantarflexion    Ankle inversion    Ankle eversion    (  Blank rows = not tested)   TRANSFERS: Assistive device utilized: None  Sit to stand: Modified independence Stand to sit: Modified independence  STAIRS: Level of Assistance: Modified independence Stair Negotiation Technique: Alternating Pattern  with Bilateral Rails Number of Stairs: 2-3  Height of Stairs: 4-6"  Comments: Reports at home she sometimes does step-to pattern  GAIT: Gait pattern: step through pattern, trendelenburg, and narrow BOS Distance walked: 50 ft  Assistive device utilized: None Level of assistance: Modified independence   FUNCTIONAL TESTS:  5 times sit to stand: 13.5 sec Timed up and go (TUG): 13.65 sec 10 meter walk test: 13.03 sec = 2.51 ft/sec Functional gait assessment: 19/30 (Scores <22/30 indicate increased fall risk)     M-CTSIB  Condition 1: Firm Surface, EO 30 Sec, Normal Sway  Condition 2: Firm Surface, EC 30 Sec, Moderate Sway  Condition 3: Foam Surface, EO 30 Sec, Mild Sway  Condition 4: Foam Surface, EC 30 Sec, Moderate and high guard arms  Sway                                                                                                                                TREATMENT DATE: 04/24/2023    PATIENT EDUCATION: Education details: Eval results, POC; initial HEP Person educated: Patient Education method: Explanation, Demonstration, and Handouts Education comprehension: verbalized understanding, returned demonstration, and needs further education  HOME EXERCISE PROGRAM: Access Code: [email or web address] URL:  [email or web address] Date: 04/24/2023 Prepared by: [facility] - Outpatient  Rehab - [facility]  Exercises - Narrow Stance with Counter Support  - 1 x daily - 7 x weekly - 1 sets - 3 reps - 30 sec hold - Heel Toe Raises with Counter Support  - 1 x daily - 7 x weekly - 1-2 sets - 10 reps  GOALS: Goals reviewed with patient? Yes  SHORT TERM GOALS: = LTGs   LONG TERM GOALS: Target date: 05/26/2023  Pt will be independent with HEP for improved balance and gait. Baseline:  Goal status: IN PROGRESS  2.  Pt will improve FGA score to at least 22/30 to decrease fall risk. Baseline:  Goal status: IN PROGRESS  3.  Pt will complete Conditions 2, 4 on MCTSIB with mild sway or less for improved balance. Baseline: Mod sway, high guard arms Goal status: IN PROGRESS  4.  Pt will verbalize plans for continued community fitness upon d/c from PT. Baseline:  Goal status: IN PROGRESS  ASSESSMENT:  CLINICAL IMPRESSION: Pt presents today with no new complaints; she does report that she has been diagnosed by retina specialist with wet macular degneration. Skilled PT session focused on use of weights (2#) for standing hip exercises; compared to bands, pt feels that she likes the weights, but 2# feels somewhat easy.  Also worked on compliant surface exercises and updated HEP to add.  Pt needs UE with compliant surface exercises. Pt will continue to benefit from skilled PT towards goals  for improved functional mobility and decreased fall risk.   OBJECTIVE IMPAIRMENTS: Abnormal gait, decreased balance, decreased mobility, difficulty walking, decreased strength, and postural dysfunction.   ACTIVITY LIMITATIONS: standing, stairs, transfers, and locomotion level  PARTICIPATION LIMITATIONS: shopping, community activity, and travel  PERSONAL FACTORS: 3+ comorbidities: PMH above  are also affecting patient's functional outcome.   REHAB POTENTIAL: Good  CLINICAL DECISION MAKING:  Evolving/moderate complexity  EVALUATION COMPLEXITY: Moderate  PLAN:  PT FREQUENCY: 2x/week  PT DURATION: 4 weeks plus eval  PLANNED INTERVENTIONS: 97110-Therapeutic exercises, 97530- Therapeutic activity, [id number]- Neuromuscular re-education, 97535- Self Care, [id number]- Manual therapy, [phone]- Gait training, Patient/Family education, Balance training, and Stair training  PLAN FOR NEXT SESSION:  Review HEP additions; Work on standing hip strengthening with progression of weights in clinic.  Continue to progress balance exercises-step taps/step ups, corner balance/compliant surfaces; dynamic gait/balance with head motions, change of speed.     Gean Maidens., PT 05/08/2023, 9:39 AM  [facility] Rehab at [facility] [address] [place], [address] [place], [place] [address] Phone # [phone] Fax # [phone]   Referring diagnosis? R26.89 (ICD-10-CM) - Impairment of balance  Treatment diagnosis? (if different than referring diagnosis) R26.81, R26.89, M62.81 What was this (referring dx) caused by? []  Surgery []  Fall [x]  Ongoing issue []  Arthritis []  Other: ____________  Laterality: []  Rt []  Lt [x]  Both  Check all possible CPT codes:  *CHOOSE 10 OR LESS*    See Planned Interventions listed in the Plan section of the Evaluation.

## 2023-05-10 ENCOUNTER — Ambulatory Visit: Payer: Medicare PPO | Admitting: Physical Therapy

## 2023-05-10 ENCOUNTER — Encounter: Payer: Self-pay | Admitting: Physical Therapy

## 2023-05-10 DIAGNOSIS — M6281 Muscle weakness (generalized): Secondary | ICD-10-CM | POA: Diagnosis not present

## 2023-05-10 DIAGNOSIS — R2681 Unsteadiness on feet: Secondary | ICD-10-CM

## 2023-05-10 DIAGNOSIS — R2689 Other abnormalities of gait and mobility: Secondary | ICD-10-CM

## 2023-05-10 NOTE — Therapy (Signed)
OUTPATIENT PHYSICAL THERAPY NEURO TREATMENT NOTE   Patient Name: Heather Andrews MRN: [id number] DOB:[birth year], 83 y.o., female Today's Date: 05/10/2023   PCP: McDiarmid, Leighton Roach, MD  REFERRING PROVIDER: McDiarmid, Leighton Roach, MD  END OF SESSION:  PT End of Session - 05/10/23 0845     Visit Number 5    Number of Visits 9    Date for PT Re-Evaluation 05/26/23    Authorization Type [employer]    Authorization Time Period 05/01/2023-05/26/2023    Authorization - Visit Number 5    Authorization - Number of Visits 8    PT Start Time 0847    PT Stop Time 0927    PT Time Calculation (min) 40 min    Equipment Utilized During Treatment Gait belt    Activity Tolerance Patient tolerated treatment well    Behavior During Therapy [facility] for tasks assessed/performed                 Past Medical History:  Diagnosis Date   Actinic keratosis 04/26/2021   Acute deep vein thrombosis (DVT) of distal end of left lower extremity (HCC) 09/08/2022   Cataracts, bilateral    Elevated cholesterol    Frozen right shoulder 04/26/2021   History of shingles    Hypertension    Lymph node enlargement groins, bilaterally 07/18/2022   Found on DVT US 07/2022     Osteopenia 02/2017   T score -1.6 FRAX 17% / 3.3% stable from prior DEXA   Osteoporosis    DEXA 04/30/19: OSTEOPENIA DEXA 2018 T score -1.6 started on Prolia due to increased FRAX and subsequently switched to Reclast x5 years (T. Fontaine, OBGYN).   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) 09/08/2022   Counseling regarding advance care planning and goals of care 09/08/2022   History of asymptomatic stroke 04/29/2022   Stress incontinence, female 04/04/2022   Osteoporosis 04/04/2022   Actinic keratosis 04/04/2022   COPD with asthma (HCC) 04/27/2021   Overactive bladder 04/26/2021   Hearing impairment 04/26/2021   Allergic rhinitis 04/26/2021   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    Pt accompanied by: self  PERTINENT HISTORY: 2 falls in past 2 years; lymphoma, COPD; other PMH see above  PAIN:  Are you having pain? No  PRECAUTIONS: Fall  RED FLAGS: None   WEIGHT BEARING RESTRICTIONS: No  FALLS: Has patient fallen in last 6 months? No  LIVING ENVIRONMENT: Lives with: lives alone and daughter lives next door Lives in: House/apartment Stairs: Yes: Internal: 13 steps; can reach both Has following equipment at home:  walking sticks, walker from her mother  PLOF: Independent and Leisure: did exercises at [employer] prior to Covid  PATIENT GOALS: To address balance.  OBJECTIVE:    TODAY'S TREATMENT: 05/10/2023 Activity Comments  Review of HEP: Feet together EO and EC head turns/nods  Good form, progressed from BUE>1 UE  Standing on Airex: Tandem stance 2 x 15 sec Side step off/on Airex x 10 Forward step off/on Airex x 10 Back step off/on Airex x 10 BUE support, cues for foot clearance  2 sets, 2nd set with 3#  Forward step and weightshift over obstacle 2 x 10 reps 3#  Sidestep along counter x 2 min 3#, cues for foot placement to avoid external rotation in RLE  Standing on Airex:  alt step taps to 6" step 2 x 10 Heel/toe raises 2 x 10 3#  Forward/back walking 20 ft, 4 reps Min guard and cues for increased foot clearance      Access Code: [email or web address] URL: [email or web address] Date: 05/08/2023 Prepared by: [facility] - Outpatient  Rehab - [facility]  Exercises - Narrow Stance with Counter Support  - 1 x daily - 7 x weekly - 1 sets - 3 reps - 30 sec hold - Heel Toe Raises with Counter Support  - 1 x daily - 7 x weekly - 1-2 sets - 10 reps - Standing Hip Abduction with Counter Support  - 1 x daily - 5 x weekly - 3 sets - 10 reps - 3 sec hold - Standing Hip Extension with Counter Support  - 1 x daily - 5 x weekly - 3 sets - 10 reps - 3 sec hold - March in Place  - 1 x daily - 5 x weekly - 3 sets - 10 reps  05/03/2023 (added resisted band to kicks, marching) - Side Stepping with Resistance at Ankles and Counter Support  - 1 x daily - 5 x weekly - 1 sets - 5 reps - Romberg Stance on Foam Pad  - 1 x daily - 7 x weekly - 3 sets - 5 reps - Romberg Stance Eyes Closed on Foam Pad  - 1 x daily - 7 x weekly - 3 sets - 5 reps   PATIENT EDUCATION: Education details: Continue current HEP  Person educated: Patient Education method: Explanation, Demonstration, Verbal cues, and Handouts Education comprehension: verbalized understanding, returned demonstration, and needs further education  ----------------------------------------------------------- Note: Objective measures were completed at Evaluation unless otherwise noted.  DIAGNOSTIC FINDINGS:  NA  COGNITION: Overall cognitive status: Within functional limits for tasks assessed   SENSATION: Light touch: WFL  POSTURE: rounded shoulders  LOWER EXTREMITY ROM:   Active ROM WFL  LOWER EXTREMITY MMT:    MMT Right Eval Left Eval  Hip flexion 4 4  Hip extension    Hip abduction 4+ 4+  Hip adduction 4+ 4+  Hip internal rotation    Hip external rotation    Knee flexion 5 5  Knee  extension 5 5  Ankle dorsiflexion 3+ 3+  Ankle plantarflexion    Ankle inversion    Ankle eversion    (Blank rows = not tested)   TRANSFERS: Assistive device utilized: None  Sit to stand: Modified independence Stand to sit: Modified independence  STAIRS: Level of Assistance: Modified independence Stair Negotiation Technique: Alternating Pattern  with Bilateral Rails Number of Stairs: 2-3  Height of Stairs: 4-6"  Comments: Reports at home she sometimes does step-to pattern  GAIT: Gait pattern: step through pattern, trendelenburg, and narrow BOS Distance walked: 50 ft  Assistive device utilized: None Level of assistance: Modified independence   FUNCTIONAL TESTS:  5 times sit to stand: 13.5 sec Timed up and go (TUG): 13.65 sec 10 meter walk test: 13.03 sec = 2.51 ft/sec Functional gait assessment: 19/30 (Scores <22/30 indicate increased fall risk)     M-CTSIB  Condition 1: Firm Surface, EO 30 Sec, Normal Sway  Condition 2: Firm Surface, EC 30 Sec, Moderate Sway  Condition 3: Foam Surface, EO 30 Sec, Mild Sway  Condition 4: Foam Surface, EC 30 Sec, Moderate and high guard arms  Sway                                                                                                                                TREATMENT DATE: 04/24/2023    PATIENT EDUCATION: Education details: Eval results, POC; initial HEP Person educated: Patient Education method: Explanation, Demonstration, and Handouts Education comprehension: verbalized understanding, returned demonstration, and needs  further education  HOME EXERCISE PROGRAM: Access Code: [email or web address] URL: [email or web address] Date: 04/24/2023 Prepared by: [facility] - Outpatient  Rehab - [facility]  Exercises - Narrow Stance with Counter Support  - 1 x daily - 7 x weekly - 1 sets - 3 reps - 30 sec hold - Heel Toe Raises with Counter Support  - 1 x daily - 7 x weekly - 1-2 sets - 10 reps  GOALS: Goals reviewed with patient? Yes  SHORT TERM GOALS: = LTGs   LONG TERM GOALS: Target date: 05/26/2023  Pt will be independent with HEP for improved balance and gait. Baseline:  Goal status: IN PROGRESS  2.  Pt will improve FGA score to at least 22/30 to decrease fall risk. Baseline:  Goal status: IN PROGRESS  3.  Pt will complete Conditions 2, 4 on MCTSIB with mild sway or less for improved balance. Baseline: Mod sway, high guard arms Goal status: IN PROGRESS  4.  Pt will verbalize plans for continued community fitness upon d/c from PT. Baseline:  Goal status: IN PROGRESS  ASSESSMENT:  CLINICAL IMPRESSION: Pt presents today with no new complaints.  She reports she ordered weights for home.  She does report noticing improved awareness of her balance.  Skilled PT session focused on dynamic standing balance, foot clearance, SLS activities; utilized 3# ankle weight for most standing exercises today.  Pt needs 1-2 UE support with standing exercises.  She does continue to have some episodes of decreased foot clearance on unlevel surface and with step taps. She will continue to benefit from skilled PT towards goals for improved functional mobility and decreased fall risk.    OBJECTIVE IMPAIRMENTS: Abnormal gait, decreased balance, decreased mobility, difficulty walking, decreased strength, and postural dysfunction.   ACTIVITY LIMITATIONS: standing, stairs, transfers, and locomotion level  PARTICIPATION LIMITATIONS: shopping, community activity, and travel  PERSONAL FACTORS: 3+ comorbidities: PMH above   are also affecting patient's functional outcome.   REHAB POTENTIAL: Good  CLINICAL DECISION MAKING: Evolving/moderate complexity  EVALUATION COMPLEXITY: Moderate  PLAN:  PT FREQUENCY: 2x/week  PT DURATION: 4 weeks plus eval  PLANNED INTERVENTIONS: 97110-Therapeutic exercises, 97530- Therapeutic activity, 97112- Neuromuscular re-education, 97535- Self Care, 30865- Manual therapy, [phone]- Gait training, Patient/Family education, Balance training, and Stair training  PLAN FOR NEXT SESSION:  Continue to work on standing hip strengthening with progression of weights in clinic.  Continue to progress balance exercises-step taps/step ups, corner balance/compliant surfaces; dynamic gait/balance with head motions, change of speed.     Gean Maidens., PT 05/10/2023, 10:37 AM  [facility] at [facility] [address] [place], [address] [place], [place] [address] Phone # [phone] Fax # [phone]   Referring diagnosis? R26.89 (ICD-10-CM) - Impairment of balance  Treatment diagnosis? (if different than referring diagnosis) R26.81, R26.89, M62.81 What was this (referring dx) caused by? []  Surgery []  Fall [x]  Ongoing issue []  Arthritis []  Other: ____________  Laterality: []  Rt []  Lt [x]  Both  Check all possible CPT codes:  *CHOOSE 10 OR LESS*    See Planned Interventions listed in the Plan section of the Evaluation.

## 2023-05-11 ENCOUNTER — Ambulatory Visit: Payer: Medicare PPO | Admitting: Family Medicine

## 2023-05-11 ENCOUNTER — Other Ambulatory Visit: Payer: Self-pay | Admitting: *Deleted

## 2023-05-11 DIAGNOSIS — M810 Age-related osteoporosis without current pathological fracture: Secondary | ICD-10-CM

## 2023-05-12 ENCOUNTER — Encounter: Payer: Self-pay | Admitting: Hematology

## 2023-05-12 ENCOUNTER — Other Ambulatory Visit: Payer: Self-pay | Admitting: *Deleted

## 2023-05-12 ENCOUNTER — Ambulatory Visit: Payer: Medicare PPO | Admitting: Family Medicine

## 2023-05-12 VITALS — Ht 59.0 in

## 2023-05-12 DIAGNOSIS — M810 Age-related osteoporosis without current pathological fracture: Secondary | ICD-10-CM | POA: Diagnosis not present

## 2023-05-12 LAB — BASIC METABOLIC PANEL
BUN/Creatinine Ratio: 23 (ref 12–28)
BUN: 23 mg/dL (ref 8–27)
CO2: 26 mmol/L (ref 20–29)
Calcium: 9.4 mg/dL (ref 8.7–10.3)
Chloride: 102 mmol/L (ref 96–106)
Creatinine, Ser: 0.99 mg/dL (ref 0.57–1.00)
Glucose: 105 mg/dL — ABNORMAL HIGH (ref 70–99)
Potassium: 4.1 mmol/L (ref 3.5–5.2)
Sodium: 144 mmol/L (ref 134–144)
eGFR: 57 mL/min/{1.73_m2} — ABNORMAL LOW (ref >59)

## 2023-05-12 LAB — VITAMIN D 25 HYDROXY (VIT D DEFICIENCY, FRACTURES): Vit D, 25-Hydroxy: 44.2 ng/mL (ref 30.0–100.0)

## 2023-05-12 MED ORDER — DENOSUMAB 60 MG/ML ~~LOC~~ SOSY
60.0000 mg | PREFILLED_SYRINGE | Freq: Once | SUBCUTANEOUS | Status: AC
  Administered 2023-05-12: 60 mg via SUBCUTANEOUS

## 2023-05-12 NOTE — Progress Notes (Signed)
Patient given [place] prolia injection 60mg /ml in the left arm. Patient tolerated injection well without reaction at the injection site. Patient will schedule next injection, which is 6 months from today.

## 2023-05-16 ENCOUNTER — Encounter: Payer: Self-pay | Admitting: Physical Therapy

## 2023-05-16 ENCOUNTER — Ambulatory Visit: Payer: Medicare PPO | Admitting: Physical Therapy

## 2023-05-16 DIAGNOSIS — M6281 Muscle weakness (generalized): Secondary | ICD-10-CM | POA: Diagnosis not present

## 2023-05-16 DIAGNOSIS — R2681 Unsteadiness on feet: Secondary | ICD-10-CM | POA: Diagnosis not present

## 2023-05-16 DIAGNOSIS — R2689 Other abnormalities of gait and mobility: Secondary | ICD-10-CM

## 2023-05-16 NOTE — Therapy (Signed)
OUTPATIENT PHYSICAL THERAPY NEURO TREATMENT NOTE   Patient Name: Heather Andrews MRN: [id number] DOB:08-18-1940, 83 y.o., female Today's Date: 05/16/2023   PCP: McDiarmid, Leighton Roach, MD  REFERRING PROVIDER: McDiarmid, Leighton Roach, MD  END OF SESSION:         Past Medical History:  Diagnosis Date   Actinic keratosis 04/26/2021   Acute deep vein thrombosis (DVT) of distal end of left lower extremity (HCC) 09/08/2022   Cataracts, bilateral    Elevated cholesterol    Frozen right shoulder 04/26/2021   History of shingles    Hypertension    Lymph node enlargement groins, bilaterally 07/18/2022   Found on DVT US 07/2022     Osteopenia 02/2017   T score -1.6 FRAX 17% / 3.3% stable from prior DEXA   Osteoporosis    DEXA 04/30/19: OSTEOPENIA DEXA 2018 T score -1.6 started on Prolia due to increased FRAX and subsequently switched to Reclast x5 years (T. Fontaine, OBGYN).   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) 09/08/2022   Counseling regarding advance care planning and goals of care 09/08/2022   History of asymptomatic stroke 04/29/2022   Stress incontinence, female 04/04/2022   Osteoporosis 04/04/2022   Actinic keratosis 04/04/2022   COPD with asthma (HCC) 04/27/2021   Overactive bladder 04/26/2021   Hearing impairment 04/26/2021   Allergic rhinitis 04/26/2021   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  Rationale for Evaluation and Treatment: Rehabilitation  SUBJECTIVE:                                                                                                                                                                                             SUBJECTIVE STATEMENT: Been lazy; got the  weights, but haven't done anything with the pillow.   Pt accompanied by: self  PERTINENT HISTORY: 2 falls in past 2 years; lymphoma, COPD; other PMH see above  PAIN:  Are you having pain? No  PRECAUTIONS: Fall  RED FLAGS: None   WEIGHT BEARING RESTRICTIONS: No  FALLS: Has patient fallen in last 6 months? No  LIVING ENVIRONMENT: Lives with: lives alone and daughter lives next door Lives in: House/apartment Stairs: Yes: Internal: 13 steps; can reach both Has following equipment at home:  walking sticks, walker from her mother  PLOF: Independent and Leisure: did exercises at [employer] prior to Covid  PATIENT GOALS: To address balance.  OBJECTIVE:     TODAY'S TREATMENT: 05/18/23 Activity Comments                       TODAY'S TREATMENT: 05/16/2023 Activity Comments  Review of HEP-see below Used 3# weights; good return demo  Partial tandem stance: EO head turns/nods x 5 EC head steady 15 sec  Mild unsteadiness  Dynamic gait activities: Fast/slow gait Forward/back gait Gait with head turns Gait with head nods Good stability  Tandem gait  Difficulty without UE support  Tandem gait forward/back and then tandem march at counter More stability at counter        Exercises - Narrow Stance with Counter Support  - 1 x daily - 7 x weekly - 1 sets - 3 reps - 30 sec hold - Heel Toe Raises with Counter Support  - 1 x  daily - 7 x weekly - 1-2 sets - 10 reps - Standing Hip Abduction with Counter Support  - 1 x daily - 5 x weekly - 3 sets - 10 reps - 3 sec hold - Standing Hip Extension with Counter Support  - 1 x daily - 5 x weekly - 3 sets - 10 reps - 3 sec hold - March in Place  - 1 x daily - 5 x weekly - 3 sets - 10 reps  05/03/2023 (added resisted band to kicks, marching) - Side Stepping with Resistance at Ankles and Counter Support  - 1 x daily - 5 x weekly - 1 sets - 5 reps - Romberg Stance on Foam Pad  - 1 x daily - 7 x weekly - 3 sets - 5 reps - Romberg Stance Eyes Closed on Foam Pad  - 1 x daily - 7 x weekly - 3 sets - 5 reps  PATIENT EDUCATION: Education details: Continued community fitness options-[employer], [employer], pool options at [employer] Person educated: Patient Education method: Explanation and Verbal cues Education comprehension: verbalized understanding    ----------------------------------------------------------- Note: Objective measures were completed at Evaluation unless otherwise noted.  DIAGNOSTIC FINDINGS: NA  COGNITION: Overall cognitive status: Within functional limits for tasks assessed   SENSATION: Light touch: WFL  POSTURE: rounded shoulders  LOWER EXTREMITY ROM:   Active ROM WFL  LOWER EXTREMITY MMT:    MMT Right Eval Left Eval  Hip flexion 4 4  Hip extension    Hip abduction 4+ 4+  Hip adduction 4+ 4+  Hip internal rotation    Hip external rotation    Knee flexion 5 5  Knee extension 5 5  Ankle dorsiflexion 3+ 3+  Ankle plantarflexion    Ankle inversion    Ankle eversion    (Blank rows = not tested)   TRANSFERS: Assistive device utilized: None  Sit to stand: Modified independence Stand to sit: Modified independence  STAIRS:  Level of Assistance: Modified independence Stair Negotiation Technique: Alternating Pattern  with Bilateral Rails Number of Stairs: 2-3  Height of Stairs: 4-6"  Comments: Reports at home she sometimes does step-to  pattern  GAIT: Gait pattern: step through pattern, trendelenburg, and narrow BOS Distance walked: 50 ft  Assistive device utilized: None Level of assistance: Modified independence   FUNCTIONAL TESTS:  5 times sit to stand: 13.5 sec Timed up and go (TUG): 13.65 sec 10 meter walk test: 13.03 sec = 2.51 ft/sec Functional gait assessment: 19/30 (Scores <22/30 indicate increased fall risk)     M-CTSIB  Condition 1: Firm Surface, EO 30 Sec, Normal Sway  Condition 2: Firm Surface, EC 30 Sec, Moderate Sway  Condition 3: Foam Surface, EO 30 Sec, Mild Sway  Condition 4: Foam Surface, EC 30 Sec, Moderate and high guard arms  Sway                                                                                                                                TREATMENT DATE: 04/24/2023    PATIENT EDUCATION: Education details: Eval results, POC; initial HEP Person educated: Patient Education method: Explanation, Demonstration, and Handouts Education comprehension: verbalized understanding, returned demonstration, and needs further education  HOME EXERCISE PROGRAM: Access Code: [email or web address] URL: [email or web address] Date: 04/24/2023 Prepared by: [facility] - Outpatient  Rehab - [facility]  Exercises - Narrow Stance with Counter Support  - 1 x daily - 7 x weekly - 1 sets - 3 reps - 30 sec hold - Heel Toe Raises with Counter Support  - 1 x daily - 7 x weekly - 1-2 sets - 10 reps  GOALS: Goals reviewed with patient? Yes  SHORT TERM GOALS: = LTGs   LONG TERM GOALS: Target date: 05/26/2023  Pt will be independent with HEP for improved balance and gait. Baseline:  Goal status: MET, 05/16/2023  2.  Pt will improve FGA score to at least 22/30 to decrease fall risk. Baseline:  Goal status: IN PROGRESS  3.  Pt will complete Conditions 2, 4 on MCTSIB with mild sway or less for improved balance. Baseline: Mod sway, high guard arms Goal status: IN PROGRESS  4.  Pt will  verbalize plans for continued community fitness upon d/c from PT. Baseline:  Goal status: IN PROGRESS  ASSESSMENT:  CLINICAL IMPRESSION: Pt presents today without complaints; she reports she has more confidence and awareness about her balance.  She does report she feels good about likely plans for discharge later this week.  Skilled PT session focused on review of full HEP; pt is independent with HEP.  Worked on dynamic balance activities and overall, pt seems to have less trunk sway.  She does have difficulty with tandem gait with no support.  Pt needs UE support for improved stability with tandem gait.  She has met LTG 1 and anticipate that she is on target towards remaining goals.  Pt will continue to  benefit from skilled PT towards goals for improved functional mobility and decreased fall risk.    OBJECTIVE IMPAIRMENTS: Abnormal gait, decreased balance, decreased mobility, difficulty walking, decreased strength, and postural dysfunction.   ACTIVITY LIMITATIONS: standing, stairs, transfers, and locomotion level  PARTICIPATION LIMITATIONS: shopping, community activity, and travel  PERSONAL FACTORS: 3+ comorbidities: PMH above  are also affecting patient's functional outcome.   REHAB POTENTIAL: Good  CLINICAL DECISION MAKING: Evolving/moderate complexity  EVALUATION COMPLEXITY: Moderate  PLAN:  PT FREQUENCY: 2x/week  PT DURATION: 4 weeks plus eval  PLANNED INTERVENTIONS: 97110-Therapeutic exercises, 97530- Therapeutic activity, 97112- Neuromuscular re-education, 97535- Self Care, 16109- Manual therapy, [id number]- Gait training, Patient/Family education, Balance training, and Stair training  PLAN FOR NEXT SESSION:  Check remaining LTGs and discuss d/c (pt has last appt scheduled for 1/30 and feels she may be ready for d/c then).  Possibly add tandem gait at counter (to HEP).

## 2023-05-16 NOTE — Telephone Encounter (Signed)
Last Prolia inj 05/12/23 Next Prolia inj due 11/10/23

## 2023-05-16 NOTE — Therapy (Signed)
OUTPATIENT PHYSICAL THERAPY NEURO TREATMENT NOTE   Patient Name: Heather Andrews MRN: [id number] DOB:1940-06-05, 83 y.o., female Today's Date: 05/16/2023   PCP: McDiarmid, Leighton Roach, MD  REFERRING PROVIDER: McDiarmid, Leighton Roach, MD  END OF SESSION:  PT End of Session - 05/16/23 0851     Visit Number 6    Number of Visits 9    Date for PT Re-Evaluation 05/26/23    Authorization Type [employer]    Authorization Time Period 05/01/2023-05/26/2023    Authorization - Visit Number 6    Authorization - Number of Visits 8    PT Start Time 0850    PT Stop Time 0928    PT Time Calculation (min) 38 min    Equipment Utilized During Treatment Gait belt    Activity Tolerance Patient tolerated treatment well    Behavior During Therapy [facility] for tasks assessed/performed                  Past Medical History:  Diagnosis Date   Actinic keratosis 04/26/2021   Acute deep vein thrombosis (DVT) of distal end of left lower extremity (HCC) 09/08/2022   Cataracts, bilateral    Elevated cholesterol    Frozen right shoulder 04/26/2021   History of shingles    Hypertension    Lymph node enlargement groins, bilaterally 07/18/2022   Found on DVT US 07/2022     Osteopenia 02/2017   T score -1.6 FRAX 17% / 3.3% stable from prior DEXA   Osteoporosis    DEXA 04/30/19: OSTEOPENIA DEXA 2018 T score -1.6 started on Prolia due to increased FRAX and subsequently switched to Reclast x5 years (T. Fontaine, OBGYN).   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) 09/08/2022   Counseling regarding advance care planning and goals of care 09/08/2022   History of asymptomatic stroke 04/29/2022   Stress incontinence, female 04/04/2022   Osteoporosis 04/04/2022   Actinic keratosis 04/04/2022   COPD with asthma (HCC) 04/27/2021   Overactive bladder 04/26/2021   Hearing impairment 04/26/2021   Allergic rhinitis 04/26/2021   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   Pt accompanied by: self  PERTINENT HISTORY: 2 falls in past 2 years; lymphoma, COPD; other PMH see above  PAIN:  Are you having pain? No  PRECAUTIONS: Fall  RED FLAGS: None   WEIGHT BEARING RESTRICTIONS: No  FALLS: Has patient fallen in last 6 months? No  LIVING ENVIRONMENT: Lives with: lives alone and daughter lives next door Lives in: House/apartment Stairs: Yes: Internal: 13 steps; can reach both Has following equipment at home:  walking sticks, walker from her mother  PLOF: Independent and Leisure: did exercises at [employer] prior to Covid  PATIENT GOALS: To address balance.  OBJECTIVE:    TODAY'S TREATMENT: 05/16/2023 Activity Comments  Review of HEP-see below Used 3# weights; good return demo   Partial tandem stance: EO head turns/nods x 5 EC head steady 15 sec  Mild unsteadiness  Dynamic gait activities: Fast/slow gait Forward/back gait Gait with head turns Gait with head nods Good stability  Tandem gait  Difficulty without UE support  Tandem gait forward/back and then tandem march at counter More stability at counter        Exercises - Narrow Stance with Counter Support  - 1 x daily - 7 x weekly - 1 sets - 3 reps - 30 sec hold - Heel Toe Raises with Counter Support  - 1 x daily - 7 x weekly - 1-2 sets - 10 reps - Standing Hip Abduction with Counter Support  - 1 x daily - 5 x weekly - 3 sets - 10 reps - 3 sec hold - Standing Hip Extension with Counter Support  - 1 x daily - 5 x weekly - 3 sets - 10 reps - 3 sec hold - March in Place  - 1 x daily - 5 x weekly - 3 sets - 10 reps  05/03/2023 (added resisted band to kicks, marching) - Side Stepping with Resistance at Ankles and Counter Support  - 1 x daily - 5 x weekly - 1 sets - 5 reps - Romberg Stance on Foam Pad  - 1 x daily - 7 x weekly - 3 sets - 5 reps - Romberg Stance Eyes Closed on Foam Pad  - 1 x daily - 7 x weekly - 3 sets - 5 reps  PATIENT EDUCATION: Education details: Continued community fitness options-[employer], [employer], pool options at [employer] Person educated: Patient Education method: Explanation and Verbal cues Education comprehension: verbalized understanding    Access Code: [email or web address] URL: [email or web address] Date: 05/08/2023 Prepared by: [facility] - Outpatient  Rehab - [facility]  Exercises - Narrow Stance with Counter Support  - 1 x daily - 7 x weekly - 1 sets - 3 reps - 30 sec hold - Heel Toe Raises with Counter Support  - 1 x daily - 7 x weekly - 1-2 sets - 10 reps - Standing Hip Abduction with Counter Support  - 1 x daily - 5 x weekly - 3 sets - 10 reps - 3 sec hold - Standing Hip Extension with Counter Support  - 1 x daily - 5 x weekly - 3 sets - 10 reps - 3 sec hold -  March in Place  - 1 x daily - 5 x weekly - 3 sets - 10 reps  05/03/2023 (added resisted band to kicks, marching) - Side Stepping with Resistance at Ankles and Counter Support  - 1 x daily - 5 x weekly -  1 sets - 5 reps - Romberg Stance on Foam Pad  - 1 x daily - 7 x weekly - 3 sets - 5 reps - Romberg Stance Eyes Closed on Foam Pad  - 1 x daily - 7 x weekly - 3 sets - 5 reps   PATIENT EDUCATION: Education details: Continue current HEP  Person educated: Patient Education method: Explanation, Demonstration, Verbal cues, and Handouts Education comprehension: verbalized understanding, returned demonstration, and needs further education  ----------------------------------------------------------- Note: Objective measures were completed at Evaluation unless otherwise noted.  DIAGNOSTIC FINDINGS: NA  COGNITION: Overall cognitive status: Within functional limits for tasks assessed   SENSATION: Light touch: WFL  POSTURE: rounded shoulders  LOWER EXTREMITY ROM:   Active ROM WFL  LOWER EXTREMITY MMT:    MMT Right Eval Left Eval  Hip flexion 4 4  Hip extension    Hip abduction 4+ 4+  Hip adduction 4+ 4+  Hip internal rotation    Hip external rotation    Knee flexion 5 5  Knee extension 5 5  Ankle dorsiflexion 3+ 3+  Ankle plantarflexion    Ankle inversion    Ankle eversion    (Blank rows = not tested)   TRANSFERS: Assistive device utilized: None  Sit to stand: Modified independence Stand to sit: Modified independence  STAIRS: Level of Assistance: Modified independence Stair Negotiation Technique: Alternating Pattern  with Bilateral Rails Number of Stairs: 2-3  Height of Stairs: 4-6"  Comments: Reports at home she sometimes does step-to pattern  GAIT: Gait pattern: step through pattern, trendelenburg, and narrow BOS Distance walked: 50 ft  Assistive device utilized: None Level of assistance: Modified independence   FUNCTIONAL TESTS:  5 times sit to stand: 13.5  sec Timed up and go (TUG): 13.65 sec 10 meter walk test: 13.03 sec = 2.51 ft/sec Functional gait assessment: 19/30 (Scores <22/30 indicate increased fall risk)     M-CTSIB  Condition 1: Firm Surface, EO 30 Sec, Normal Sway  Condition 2: Firm Surface, EC 30 Sec, Moderate Sway  Condition 3: Foam Surface, EO 30 Sec, Mild Sway  Condition 4: Foam Surface, EC 30 Sec, Moderate and high guard arms  Sway                                                                                                                                TREATMENT DATE: 04/24/2023    PATIENT EDUCATION: Education details: Eval results, POC; initial HEP Person educated: Patient Education method: Explanation, Demonstration, and Handouts Education comprehension: verbalized understanding, returned demonstration, and needs further education  HOME EXERCISE PROGRAM: Access Code: [email or web address] URL: [email or web address] Date: 04/24/2023 Prepared by: [facility] - Outpatient  Rehab - [facility]  Exercises - Narrow Stance with Counter Support  - 1 x daily - 7 x weekly - 1 sets - 3 reps - 30 sec hold - Heel Toe Raises with Counter Support  - 1 x daily - 7 x weekly -  1-2 sets - 10 reps  GOALS: Goals reviewed with patient? Yes  SHORT TERM GOALS: = LTGs   LONG TERM GOALS: Target date: 05/26/2023  Pt will be independent with HEP for improved balance and gait. Baseline:  Goal status: MET, 05/16/2023  2.  Pt will improve FGA score to at least 22/30 to decrease fall risk. Baseline:  Goal status: IN PROGRESS  3.  Pt will complete Conditions 2, 4 on MCTSIB with mild sway or less for improved balance. Baseline: Mod sway, high guard arms Goal status: IN PROGRESS  4.  Pt will verbalize plans for continued community fitness upon d/c from PT. Baseline:  Goal status: IN PROGRESS  ASSESSMENT:  CLINICAL IMPRESSION: Pt presents today without complaints; she reports she has more confidence and awareness about  her balance.  She does report she feels good about likely plans for discharge later this week.  Skilled PT session focused on review of full HEP; pt is independent with HEP.  Worked on dynamic balance activities and overall, pt seems to have less trunk sway.  She does have difficulty with tandem gait with no support.  Pt needs UE support for improved stability with tandem gait.  She has met LTG 1 and anticipate that she is on target towards remaining goals.  Pt will continue to benefit from skilled PT towards goals for improved functional mobility and decreased fall risk.    OBJECTIVE IMPAIRMENTS: Abnormal gait, decreased balance, decreased mobility, difficulty walking, decreased strength, and postural dysfunction.   ACTIVITY LIMITATIONS: standing, stairs, transfers, and locomotion level  PARTICIPATION LIMITATIONS: shopping, community activity, and travel  PERSONAL FACTORS: 3+ comorbidities: PMH above  are also affecting patient's functional outcome.   REHAB POTENTIAL: Good  CLINICAL DECISION MAKING: Evolving/moderate complexity  EVALUATION COMPLEXITY: Moderate  PLAN:  PT FREQUENCY: 2x/week  PT DURATION: 4 weeks plus eval  PLANNED INTERVENTIONS: 97110-Therapeutic exercises, 97530- Therapeutic activity, 97112- Neuromuscular re-education, 97535- Self Care, 16109- Manual therapy, [phone]- Gait training, Patient/Family education, Balance training, and Stair training  PLAN FOR NEXT SESSION:  Check remaining LTGs and discuss d/c (pt has last appt scheduled for 1/30 and feels she may be ready for d/c then).  Possibly add tandem gait at counter (to HEP).     Gean Maidens., PT 05/16/2023, 9:37 AM  [facility] at [facility] [address] [place], [address] [place], [place] [address] Phone # [phone] Fax # [phone]   Referring diagnosis? R26.89 (ICD-10-CM) - Impairment of balance  Treatment diagnosis? (if different than referring diagnosis) R26.81, R26.89,  M62.81 What was this (referring dx) caused by? []  Surgery []  Fall [x]  Ongoing issue []  Arthritis []  Other: ____________  Laterality: []  Rt []  Lt [x]  Both  Check all possible CPT codes:  *CHOOSE 10 OR LESS*    See Planned Interventions listed in the Plan section of the Evaluation.

## 2023-05-16 NOTE — Patient Instructions (Signed)
Community fitness options   [employer] [facility] or [facility])  [employer]-you can look up locations online     ([employer] is a small fitness center off [address])  Pool options  [employer] pool and equipment and classes

## 2023-05-18 ENCOUNTER — Encounter: Payer: Self-pay | Admitting: Physical Therapy

## 2023-05-18 ENCOUNTER — Ambulatory Visit: Payer: Medicare PPO | Admitting: Physical Therapy

## 2023-05-18 DIAGNOSIS — M6281 Muscle weakness (generalized): Secondary | ICD-10-CM

## 2023-05-18 DIAGNOSIS — R2681 Unsteadiness on feet: Secondary | ICD-10-CM

## 2023-05-18 DIAGNOSIS — R2689 Other abnormalities of gait and mobility: Secondary | ICD-10-CM

## 2023-05-26 ENCOUNTER — Other Ambulatory Visit: Payer: Self-pay

## 2023-05-26 DIAGNOSIS — C8294 Follicular lymphoma, unspecified, lymph nodes of axilla and upper limb: Secondary | ICD-10-CM

## 2023-05-29 ENCOUNTER — Inpatient Hospital Stay: Payer: Medicare PPO | Attending: Hematology

## 2023-05-29 ENCOUNTER — Inpatient Hospital Stay: Payer: Medicare PPO | Admitting: Hematology

## 2023-05-29 VITALS — BP 146/54 | HR 78 | Temp 97.7°F | Resp 18 | Wt 170.3 lb

## 2023-05-29 DIAGNOSIS — Z7189 Other specified counseling: Secondary | ICD-10-CM

## 2023-05-29 DIAGNOSIS — Z87891 Personal history of nicotine dependence: Secondary | ICD-10-CM | POA: Insufficient documentation

## 2023-05-29 DIAGNOSIS — Z79899 Other long term (current) drug therapy: Secondary | ICD-10-CM | POA: Insufficient documentation

## 2023-05-29 DIAGNOSIS — C8294 Follicular lymphoma, unspecified, lymph nodes of axilla and upper limb: Secondary | ICD-10-CM

## 2023-05-29 DIAGNOSIS — C8204 Follicular lymphoma grade I, lymph nodes of axilla and upper limb: Secondary | ICD-10-CM | POA: Diagnosis not present

## 2023-05-29 LAB — CBC WITH DIFFERENTIAL (CANCER CENTER ONLY)
Abs Immature Granulocytes: 0.03 10*3/uL (ref 0.00–0.07)
Basophils Absolute: 0.1 10*3/uL (ref 0.0–0.1)
Basophils Relative: 1 %
Eosinophils Absolute: 0.2 10*3/uL (ref 0.0–0.5)
Eosinophils Relative: 3 %
HCT: 36.1 % (ref 36.0–46.0)
Hemoglobin: 11.8 g/dL — ABNORMAL LOW (ref 12.0–15.0)
Immature Granulocytes: 0 %
Lymphocytes Relative: 10 %
Lymphs Abs: 0.7 10*3/uL (ref 0.7–4.0)
MCH: 30.7 pg (ref 26.0–34.0)
MCHC: 32.7 g/dL (ref 30.0–36.0)
MCV: 94 fL (ref 80.0–100.0)
Monocytes Absolute: 0.9 10*3/uL (ref 0.1–1.0)
Monocytes Relative: 12 %
Neutro Abs: 5.8 10*3/uL (ref 1.7–7.7)
Neutrophils Relative %: 74 %
Platelet Count: 206 10*3/uL (ref 150–400)
RBC: 3.84 MIL/uL — ABNORMAL LOW (ref 3.87–5.11)
RDW: 14.3 % (ref 11.5–15.5)
WBC Count: 7.7 10*3/uL (ref 4.0–10.5)
nRBC: 0 % (ref 0.0–0.2)

## 2023-05-29 LAB — CMP (CANCER CENTER ONLY)
ALT: 12 U/L (ref 0–44)
AST: 20 U/L (ref 15–41)
Albumin: 4.1 g/dL (ref 3.5–5.0)
Alkaline Phosphatase: 48 U/L (ref 38–126)
Anion gap: 8 (ref 5–15)
BUN: 21 mg/dL (ref 8–23)
CO2: 28 mmol/L (ref 22–32)
Calcium: 9.3 mg/dL (ref 8.9–10.3)
Chloride: 102 mmol/L (ref 98–111)
Creatinine: 0.89 mg/dL (ref 0.44–1.00)
GFR, Estimated: >60 mL/min (ref >60)
Glucose, Bld: 111 mg/dL — ABNORMAL HIGH (ref 70–99)
Potassium: 3.8 mmol/L (ref 3.5–5.1)
Sodium: 138 mmol/L (ref 135–145)
Total Bilirubin: 0.5 mg/dL (ref 0.0–1.2)
Total Protein: 6.7 g/dL (ref 6.5–8.1)

## 2023-05-29 LAB — LACTATE DEHYDROGENASE: LDH: 217 U/L — ABNORMAL HIGH (ref 98–192)

## 2023-05-29 NOTE — Progress Notes (Signed)
HEMATOLOGY/ONCOLOGY CLINIC NOTE  Date of Service: 05/29/23  Patient Care Team: McDiarmid, Leighton Roach, MD as PCP - General (Family Medicine) [name], MD as Consulting Physician (Hematology)  CHIEF COMPLAINTS/PURPOSE OF CONSULTATION:  Evaluation and management of follicular lymphoma.  HISTORY OF PRESENTING ILLNESS:   Heather Andrews is a wonderful 83 y.o. female who has been referred to [place] by [name], MD for evaluation and management of newly-diagnosed low-grade follicular lymphoma.   Today, she is accompanied by her daughter. She reports that she has had right LE edema for about 6 months. Upon examination, patient has bilateral LE edema, right more than left.   She also reports having a rash at one point. Patient denies any medication changes, long distance travel, or unusual activity. Patient denies any redness/pain in the area.  Patient denies back pain, recent infections, abdominal pain, change in bowel habits, fever, chills, or night sweats.  She does report that she tripped and suffered a fall in January 2024, causing her to hit the side of her head.  Patient denies any breast discomfort or swelling besides in her LE. She does however sometimes experience tingling in her hands.  Patient does confirm having a silent stroke at some point previously, but is unsure of when it occurred.   Patient does report that she is "slowing down" in general, which she attributes to her age. Patient denies any sudden new fatigue or other major limiting medical issues at this time. Patient does regualrly take vitamin D, but is unsure of how many units she takes. She also takes vitamin K. Patient denies any concern for varicose veins in the past. Patient reports that she will be traveling to [facility] for a week and will leave May 4th.  INTERVAL HISTORY:  Heather Andrews is a 83 y.o. female here for continued evaluation and management of newly-diagnosed follicular  lymphoma.  Patient was last seen by me on 01/17/2023 and she complained of itchiness throughout her body and chronic fatigue.    Patient is accompanied by her son-in-law during this visit. Patient notes she has been doing well overall since our last visit. She does complain of chronic mild lethargy/fatigue and one enlarged lymph node near her right groin area, which has slightly increased in size since our last visit.   She denies any new infection issues, fever, chills, unexpected weight loss, back pain, chest pain, abdominal pain, or leg swelling. She does complain of insomnia and occasional mild night sweats. She also reports of mild new lump near her right subclavicular area.   She is complaint with all of her medications. She is UTD with influenza vaccine, COVID-19 Booster, RSV vaccine, and othera age related vaccines.   MEDICAL HISTORY: . 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 Fontaine, OBGYN).   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  Sensorineural hearing loss (SNHL) of both ears    Patient wears biaural hearing aids   Stress incontinence, female    USI    SURGICAL HISTORY: Past Surgical History:  Procedure Laterality Date   BASAL CELL CARCINOMA EXCISION     CATARACT EXTRACTION     Cyst removed from chest     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"    SOCIAL HISTORY: Social History    Socioeconomic History   Marital status: Widowed    Spouse name: Not on file   Number of children: 3   Years of education: 15   Highest education level: Master's degree (e.g., MA, MS, MEng, MEd, MSW, MBA)  Occupational History   Occupation: Tourist information centre manager    Comment: Retired  Tobacco Use   Smoking status: Former    Current packs/day: 0.50    Average packs/day: 0.5 packs/day for 67.1 years (33.6 ttl pk-yrs)    Types: Cigarettes    Start date: 04/1956   Smokeless tobacco: Never  Vaping Use   Vaping status: Never Used  Substance and Sexual Activity   Alcohol use: Yes    Comment: occassionally   Drug use: No   Sexual activity: Never    Comment: 1st intercourse 83 yo-Fewer than 5 partners  Other Topics Concern   Not on file  Social History Narrative   Widowed, lives alone though a daughter lives next door.   3 children: [name]; [name], Lawernce Pitts   Activities: reading, interacting with her two cats, playing cards, corresponding with others (email, snail mail), needlework, cooking   Patient indicated on Geri Assessment questionnaire that she would want her daughter, Clelia Croft to make medical decisions on her behalf should she be unable. [phone]).  The patient gives permission to speak to Ms Romnet about issue around the patient's health.      Patient lives in a townhouse.  13 steps to get into home.  Home has two levels.    Assists Devices in home: Grab Bars   Regular exercise: Yoga once a week   Transportation: Patient drives   Social Drivers of Health   Financial Resource Strain: Low Risk  (01/11/2023)   Overall Financial Resource Strain (CARDIA)    Difficulty of Paying Living Expenses: Not hard at all  Food Insecurity: No Food Insecurity (01/11/2023)   Hunger Vital Sign    Worried About Running Out of Food in the Last Year: Never true    Ran Out of Food in the Last Year: Never true  Transportation Needs: No Transportation Needs (01/11/2023)    PRAPARE - Administrator, Civil Service (Medical): No    Lack of Transportation (Non-Medical): No  Physical Activity: Insufficiently Active (01/11/2023)   Exercise Vital Sign    Days of Exercise per Week: 4 days    Minutes of Exercise per Session: 30 min  Stress: No Stress Concern Present (01/11/2023)   [employer] of Occupational Health - Occupational Stress Questionnaire    Feeling of Stress : Not at all  Social Connections: Moderately Integrated (01/11/2023)   Social Connection and Isolation Panel [NHANES]    Frequency of Communication with Friends and Family: More than three times a week    Frequency of Social Gatherings with Friends and Family: Twice a week    Attends Religious Services: More than 4 times per year    Active Member of [employer] or Organizations: Yes    Attends Banker Meetings:  More than 4 times per year    Marital Status: Widowed  Intimate Partner Violence: Not At Risk (12/30/2022)   Humiliation, Afraid, Rape, and Kick questionnaire    Fear of Current or Ex-Partner: No    Emotionally Abused: No    Physically Abused: No    Sexually Abused: No    FAMILY HISTORY: Family History  Problem Relation Age of Onset   Hypertension Mother    Heart disease Paternal Grandmother    Heart disease Paternal Grandfather    Cancer Brother        Leukemia    ALLERGIES:  is allergic to lisinopril, ephedrine-guaifenesin, and rituximab.  MEDICATIONS:  Current Outpatient Medications  Medication Sig Dispense Refill   albuterol (VENTOLIN HFA) 108 (90 Base) MCG/ACT inhaler Inhale 2 puffs into the lungs every 6 (six) hours as needed for wheezing or shortness of breath. 18 g 5   calcium carbonate (OSCAL) 1500 (600 Ca) MG TABS tablet Take 600 mg by mouth 2 (two) times daily.     Cholecalciferol (VITAMIN D3) 125 MCG (5000 UT) CAPS Take 125 mcg by mouth daily.     Cyanocobalamin (VITAMIN B-12 PO) Take 1 tablet by mouth daily.     denosumab (PROLIA) 60 MG/ML  SOSY injection Inject 60 mg into the skin every 6 (six) months.     ELIQUIS 5 MG TABS tablet TAKE 1 TABLET BY MOUTH TWICE A DAY 60 tablet 4   GEMTESA 75 MG TABS Take 1 tablet by mouth daily.     hydrochlorothiazide (HYDRODIURIL) 25 MG tablet TAKE 1 TABLET (25 MG TOTAL) BY MOUTH DAILY. 90 tablet 3   rosuvastatin (CRESTOR) 20 MG tablet TAKE 1 TABLET BY MOUTH EVERY DAY 90 tablet 3   Tiotropium Bromide-Olodaterol (STIOLTO RESPIMAT) 2.5-2.5 MCG/ACT AERS INHALE 2 PUFFS BY MOUTH INTO THE LUNGS DAILY 4 g 11   No current facility-administered medications for this visit.    REVIEW OF SYSTEMS:    10 Point review of Systems was done is negative except as noted above.   PHYSICAL EXAMINATION: ECOG PERFORMANCE STATUS: 1 - Symptomatic but completely ambulatory  . Vitals:   05/29/23 0939  BP: (!) 146/54  Pulse: 78  Resp: 18  Temp: 97.7 F (36.5 C)    Filed Weights   05/29/23 0939  Weight: 170 lb 4.8 oz (77.2 kg)   .Body mass index is 34.4 kg/m.  GENERAL:alert, in no acute distress and comfortable SKIN: no acute rashes, no significant lesions EYES: conjunctiva are pink and non-injected, sclera anicteric OROPHARYNX: MMM, no exudates, no oropharyngeal erythema or ulceration NECK: supple, no JVD LYMPH:  no palpable lymphadenopathy in the cervical, axillary or inguinal regions LUNGS: clear to auscultation b/l with normal respiratory effort HEART: regular rate & rhythm ABDOMEN:  normoactive bowel sounds , non tender, not distended. Extremity: no pedal edema PSYCH: alert & oriented x 3 with fluent speech NEURO: no focal motor/sensory deficits   LABORATORY DATA:  I have reviewed the data as listed .    Latest Ref Rng & Units 05/29/2023    8:59 AM 01/17/2023    8:42 AM 10/12/2022    9:55 AM  CBC  WBC 4.0 - 10.5 K/uL 7.7  6.3  11.1   Hemoglobin 12.0 - 15.0 g/dL 16.1  09.6  04.5   Hematocrit 36.0 - 46.0 % 36.1  37.2  33.1   Platelets 150 - 400 K/uL 206  226  224    .    Latest Ref Rng  & Units  05/11/2023   11:32 AM 01/17/2023    8:42 AM 10/12/2022    9:55 AM  CMP  Glucose 70 - 99 mg/dL 161  096  045   BUN 8 - 27 mg/dL 23  30  30    Creatinine 0.57 - 1.00 mg/dL 4.09  8.11  9.14   Sodium 134 - 144 mmol/L 144  140  139   Potassium 3.5 - 5.2 mmol/L 4.1  3.7  3.6   Chloride 96 - 106 mmol/L 102  102  101   CO2 20 - 29 mmol/L 26  30  31    Calcium 8.7 - 10.3 mg/dL 9.4  9.6  9.5   Total Protein 6.5 - 8.1 g/dL  6.8  6.6   Total Bilirubin 0.3 - 1.2 mg/dL  0.4  0.3   Alkaline Phos 38 - 126 U/L  40  46   AST 15 - 41 U/L  16  18   ALT 0 - 44 U/L  11  26    . Lab Results  Component Value Date   LDH 180 01/17/2023   Component     Latest Ref Rng 08/04/2022  Vitamin D, 25-Hydroxy     30 - 100 ng/mL 49.94   HIV Screen 4th Generation wRfx     Non Reactive  Non Reactive   Hepatitis B Surface Ag     NON REACTIVE  NON REACTIVE   Hep B Core Total Ab     NON REACTIVE  NON REACTIVE   HCV Ab     NON REACTIVE  NON REACTIVE      Needle core biopsy 07/27/2022:   Breast Ultrasound 07/20/2022:  Ultrasound guided biopsy:  Ultrasound guided biopsy:       RADIOGRAPHIC STUDIES: I have personally reviewed the radiological images as listed and agreed with the findings in the report. No results found.  ASSESSMENT & PLAN:   Wonderful 83 y.o. female with:  Recently-diagnosed low grade follicular lymphoma incidentally noted LNadenopathy on routine mammogram. History of asymptomatic stroke  Hypertension  Pure hypercholesterolemia  Contusion of right shoulder region  6. Acute DVT of left peroneal veins  PLAN:  -Discussed lab results from today, 05/29/2023, in detail with the patient. CBC is stable. CMP pending. LDH is pending. -Re-discussed CT chest abdomen pelvis results from 01/10/2023 in detail with the patient. Showed Minimal if any response to therapy within the chest, abdomen, or pelvis. Although some nodes measure smaller (anterior mediastinum and abdominal  retroperitoneum), others are minimally enlarged (right axilla and small-bowel mesentery). The right lower cervical thyroid versus parathyroid nodule (favored thyroid origin) is unchanged and can be re-evaluated on follow-up staging Cts or more entirely evaluated with thyroid ultrasound. -Discussed with the patient that the next plan would be either 1. Combine riTUXimab with chemo-therapy treatment or 2. maintenance of riTUXimab and change treatment plan if symptom persists. -Discussed with the patient that if she wants to proceed with maintenance riTUXimab treatment, we will have to get another baseline PET scan, if insurance allows, in 3 weeks. If insurance does not allow, we can proceed with CT scan.  -Patient notes she wants to proceed with maintenance riTUXimab treatment.  -Schedule PET scan in 3 weeks.  -Discussed with the patient that if PET/ct scan shows more abnormalities, we will need to proceed with rituximab plus chemo-therapy treatment. -Patient agrees with the plan.  -Answered all of patient's questions.    FOLLOW-UP: Pet/ct in 3 weeks RTC with Dr Candise Che with labs and appointment for  maintenance Rituxan in 5 weeks  The total time spent in the appointment was 32 minutes* .  All of the patient's questions were answered with apparent satisfaction. The patient knows to call the clinic with any problems, questions or concerns.   Wyvonnia Lora MD MS AAHIVMS [facility] Hematology/Oncology Physician [facility]  .*Total Encounter Time as defined by the Centers for Medicare and Medicaid Services includes, in addition to the face-to-face time of a patient visit (documented in the note above) non-face-to-face time: obtaining and reviewing outside history, ordering and reviewing medications, tests or procedures, care coordination (communications with other health care professionals or caregivers) and documentation in the medical record.   I,Param Shah,acting as a Neurosurgeon for Wyvonnia Lora, MD.,have documented all relevant documentation on the behalf of Wyvonnia Lora, MD,as directed by  Wyvonnia Lora, MD while in the presence of Wyvonnia Lora, MD.  .I have reviewed the above documentation for accuracy and completeness, and I agree with the above. [name] MD

## 2023-05-31 DIAGNOSIS — H353211 Exudative age-related macular degeneration, right eye, with active choroidal neovascularization: Secondary | ICD-10-CM | POA: Diagnosis not present

## 2023-06-04 ENCOUNTER — Encounter: Payer: Self-pay | Admitting: Hematology

## 2023-06-05 ENCOUNTER — Encounter: Payer: Self-pay | Admitting: Hematology

## 2023-06-12 ENCOUNTER — Encounter (HOSPITAL_COMMUNITY)
Admission: RE | Admit: 2023-06-12 | Discharge: 2023-06-12 | Disposition: A | Discharge status: Home / Self Care | Payer: Medicare PPO | Source: Ambulatory Visit | Attending: Hematology | Admitting: Hematology

## 2023-06-12 DIAGNOSIS — C8294 Follicular lymphoma, unspecified, lymph nodes of axilla and upper limb: Secondary | ICD-10-CM | POA: Diagnosis not present

## 2023-06-12 DIAGNOSIS — C8594 Non-Hodgkin lymphoma, unspecified, lymph nodes of axilla and upper limb: Secondary | ICD-10-CM | POA: Diagnosis not present

## 2023-06-12 LAB — GLUCOSE, CAPILLARY: Glucose-Capillary: 123 mg/dL — ABNORMAL HIGH (ref 70–99)

## 2023-06-12 MED ORDER — FLUDEOXYGLUCOSE F - 18 (FDG) INJECTION
8.4800 | Freq: Once | INTRAVENOUS | Status: AC
  Administered 2023-06-12: 8.48 via INTRAVENOUS

## 2023-06-27 ENCOUNTER — Ambulatory Visit: Admitting: Student

## 2023-06-27 ENCOUNTER — Encounter: Payer: Self-pay | Admitting: Student

## 2023-06-27 VITALS — BP 122/44 | HR 71 | Ht 59.0 in | Wt 168.8 lb

## 2023-06-27 DIAGNOSIS — M1712 Unilateral primary osteoarthritis, left knee: Secondary | ICD-10-CM

## 2023-06-27 MED ORDER — DICLOFENAC SODIUM 1 % EX GEL: 2.0000 g | Freq: Four times a day (QID) | CUTANEOUS | 2 refills | Status: AC

## 2023-06-27 NOTE — Patient Instructions (Signed)
 Pleasure to meet you today.  I suspect your left knee pain is possibly due to arthritis.  I recommend continue use of your Tylenol as needed.  I have sent in prescription for cream to apply over the affected knee.  If no improvement in 2 weeks please return so that we can get x-ray and consider steroid injection of your left knee.

## 2023-06-27 NOTE — Progress Notes (Unsigned)
    SUBJECTIVE:   CHIEF COMPLAINT / HPI:   83 year old female with history of left knee osteoarthritis presenting due to worsening knee pain in the last week.  She reports spontaneous increase in pain few days ago.  Pain gets worse throughout the day and with ambulation.  Denies any recent trauma no notable fever, chills or knee swelling.  Has been using as needed Tylenol with mild relief.  PERTINENT  PMH / PSH: GERD, osteoporosis, left knee osteoarthritis, asymptomatic stroke on Eliquis.  OBJECTIVE:   BP (!) 122/44   Pulse 71   Ht 4\' 11"  (1.499 m)   Wt 168 lb 12.8 oz (76.6 kg)   SpO2 96%   BMI 34.09 kg/m    Physical Exam General: Alert, well appearing, NAD Cardiovascular: RRR, No Murmurs, Normal S2/S2 Respiratory: CTAB, No wheezing or Rales Abdomen: No distension or tenderness Extremities: No knee swelling, mild tenderness on medial aspect of left knee. Limping gait favoring left  ASSESSMENT/PLAN:   Primary osteoarthritis of left knee Chronic for patient.  No recent imaging. Given patient is currently on blood thinners will avoid NSAIDs at this time. 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  Suspect his is age-related given wide pulse pressure.  He could consider holding HCTZ if appropriate.  Jerre Simon, MD [facility]

## 2023-06-28 NOTE — Assessment & Plan Note (Addendum)
 Chronic for patient.  No recent imaging. Given patient is currently on blood thinners will avoid NSAIDs at this time. -Continue as needed Tylenol -Rx topical Voltaren gel -Will consider imaging -Via shared decision we will consider steroid injection if no improvement in 2 weeks

## 2023-07-03 ENCOUNTER — Other Ambulatory Visit: Payer: Self-pay | Admitting: Hematology

## 2023-07-03 ENCOUNTER — Encounter: Payer: Self-pay | Admitting: Hematology

## 2023-07-04 ENCOUNTER — Other Ambulatory Visit: Payer: Self-pay

## 2023-07-04 DIAGNOSIS — C8294 Follicular lymphoma, unspecified, lymph nodes of axilla and upper limb: Secondary | ICD-10-CM

## 2023-07-05 ENCOUNTER — Inpatient Hospital Stay: Payer: Medicare PPO

## 2023-07-05 ENCOUNTER — Inpatient Hospital Stay: Payer: Medicare PPO | Admitting: Hematology

## 2023-07-05 ENCOUNTER — Inpatient Hospital Stay: Payer: Medicare PPO | Attending: Hematology

## 2023-07-05 ENCOUNTER — Encounter: Payer: Self-pay | Admitting: Hematology

## 2023-07-05 VITALS — BP 123/35 | HR 77 | Temp 97.7°F | Resp 18 | Ht 59.0 in | Wt 170.7 lb

## 2023-07-05 VITALS — BP 135/63 | HR 81

## 2023-07-05 DIAGNOSIS — C8294 Follicular lymphoma, unspecified, lymph nodes of axilla and upper limb: Secondary | ICD-10-CM

## 2023-07-05 DIAGNOSIS — Z79899 Other long term (current) drug therapy: Secondary | ICD-10-CM | POA: Diagnosis not present

## 2023-07-05 DIAGNOSIS — Z5112 Encounter for antineoplastic immunotherapy: Secondary | ICD-10-CM | POA: Insufficient documentation

## 2023-07-05 DIAGNOSIS — Z7189 Other specified counseling: Secondary | ICD-10-CM

## 2023-07-05 LAB — CMP (CANCER CENTER ONLY)
ALT: 15 U/L (ref 0–44)
AST: 21 U/L (ref 15–41)
Albumin: 4.2 g/dL (ref 3.5–5.0)
Alkaline Phosphatase: 43 U/L (ref 38–126)
Anion gap: 8 (ref 5–15)
BUN: 24 mg/dL — ABNORMAL HIGH (ref 8–23)
CO2: 28 mmol/L (ref 22–32)
Calcium: 9.1 mg/dL (ref 8.9–10.3)
Chloride: 103 mmol/L (ref 98–111)
Creatinine: 0.79 mg/dL (ref 0.44–1.00)
GFR, Estimated: >60 mL/min (ref >60)
Glucose, Bld: 114 mg/dL — ABNORMAL HIGH (ref 70–99)
Potassium: 3.6 mmol/L (ref 3.5–5.1)
Sodium: 139 mmol/L (ref 135–145)
Total Bilirubin: 0.3 mg/dL (ref 0.0–1.2)
Total Protein: 6.9 g/dL (ref 6.5–8.1)

## 2023-07-05 LAB — CBC WITH DIFFERENTIAL (CANCER CENTER ONLY)
Abs Immature Granulocytes: 0.03 10*3/uL (ref 0.00–0.07)
Basophils Absolute: 0.1 10*3/uL (ref 0.0–0.1)
Basophils Relative: 1 %
Eosinophils Absolute: 0.2 10*3/uL (ref 0.0–0.5)
Eosinophils Relative: 3 %
HCT: 35.5 % — ABNORMAL LOW (ref 36.0–46.0)
Hemoglobin: 11.9 g/dL — ABNORMAL LOW (ref 12.0–15.0)
Immature Granulocytes: 1 %
Lymphocytes Relative: 13 %
Lymphs Abs: 0.8 10*3/uL (ref 0.7–4.0)
MCH: 30.7 pg (ref 26.0–34.0)
MCHC: 33.5 g/dL (ref 30.0–36.0)
MCV: 91.7 fL (ref 80.0–100.0)
Monocytes Absolute: 0.8 10*3/uL (ref 0.1–1.0)
Monocytes Relative: 12 %
Neutro Abs: 4.4 10*3/uL (ref 1.7–7.7)
Neutrophils Relative %: 70 %
Platelet Count: 232 10*3/uL (ref 150–400)
RBC: 3.87 MIL/uL (ref 3.87–5.11)
RDW: 14.7 % (ref 11.5–15.5)
WBC Count: 6.3 10*3/uL (ref 4.0–10.5)
nRBC: 0 % (ref 0.0–0.2)

## 2023-07-05 LAB — LACTATE DEHYDROGENASE: LDH: 165 U/L (ref 98–192)

## 2023-07-05 MED ORDER — ACETAMINOPHEN 500 MG PO TABS
1000.0000 mg | ORAL_TABLET | Freq: Once | ORAL | Status: AC
  Administered 2023-07-05: 1000 mg via ORAL
  Filled 2023-07-05: qty 2

## 2023-07-05 MED ORDER — SODIUM CHLORIDE 0.9 % IV SOLN: Freq: Once | INTRAVENOUS | Status: AC

## 2023-07-05 MED ORDER — MONTELUKAST SODIUM 10 MG PO TABS
10.0000 mg | ORAL_TABLET | Freq: Once | ORAL | Status: AC
  Administered 2023-07-05: 10 mg via ORAL
  Filled 2023-07-05: qty 1

## 2023-07-05 MED ORDER — CETIRIZINE HCL 10 MG/ML IV SOLN
10.0000 mg | Freq: Once | INTRAVENOUS | Status: AC
  Administered 2023-07-05: 10 mg via INTRAVENOUS
  Filled 2023-07-05: qty 1

## 2023-07-05 MED ORDER — FAMOTIDINE IN NACL 20-0.9 MG/50ML-% IV SOLN
20.0000 mg | Freq: Once | INTRAVENOUS | Status: AC
  Administered 2023-07-05: 20 mg via INTRAVENOUS
  Filled 2023-07-05: qty 50

## 2023-07-05 MED ORDER — METHYLPREDNISOLONE SODIUM SUCC 125 MG IJ SOLR
125.0000 mg | Freq: Once | INTRAMUSCULAR | Status: AC
  Administered 2023-07-05: 125 mg via INTRAVENOUS
  Filled 2023-07-05: qty 2

## 2023-07-05 MED ORDER — SODIUM CHLORIDE 0.9 % IV SOLN
375.0000 mg/m2 | Freq: Once | INTRAVENOUS | Status: AC
  Administered 2023-07-05: 700 mg via INTRAVENOUS
  Filled 2023-07-05: qty 20

## 2023-07-05 NOTE — Progress Notes (Signed)
 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  HISTORY OF PRESENTING ILLNESS:   Heather Andrews is a wonderful 83 y.o. female who has been referred to [place] by [name], MD for evaluation and management of newly-diagnosed low-grade follicular lymphoma.   Today, she is accompanied by her daughter. She reports that she has had right LE edema for about 6 months. Upon examination, patient has bilateral LE edema, right more than left.   She also reports having a rash at one point. Patient denies any medication changes, long distance travel, or unusual activity. Patient denies any redness/pain in the area.  Patient denies back pain, recent infections, abdominal pain, change in bowel habits, fever, chills, or night sweats.  She does report that she tripped and suffered a fall in January 2024, causing her to hit the side of her head.  Patient denies any breast discomfort or swelling besides in her LE. She does however sometimes experience tingling in her hands.  Patient does confirm having a silent stroke at some point previously, but is unsure of when it occurred.   Patient does report that she is "slowing down" in general, which she attributes to her age. Patient denies any sudden new fatigue or other major limiting medical issues at this time. Patient does regualrly take vitamin D, but is unsure of how many units she takes. She also takes vitamin K. Patient denies any concern for varicose veins in the past. Patient reports that she will be traveling to [facility] for a week and will leave May 4th.  INTERVAL HISTORY:  Heather Andrews is a 83 y.o. female here for continued evaluation and management of  follicular lymphoma.  Patient was last  seen by me on 05/29/2023 and complained of chronic mild lethargy/fatigue, one enlarged lymph node near her right groin area which slightly increased in size, insomnia, occasional mild night sweats, and mild new lump near her right subclavicular area.   Today, she presents in a wheelchair and is accompanied by her daughter. Patient reports no new concerns since her last clinical visit.   She reports receiving injections into her eye for Macular degeneration.   She reports constant fatigue, which is stable from her last clinical visit.   She denies feeling any enlarged lymph nodes. Patient denies any fever, chills, sudden weight-loss, or abdominal pain. Her previous leg swelling has improved. Patient has been eating well.   She complains of night sweats once in a while, about once every 3-4 weeks. Her night sweats awaken her during the night. Patient notes that her night sweats were previously worse prior to her cancer diagnosis.   She reports left knee pain attributed to arthritis. Patient uses a walker when outdoors.   Patient reports that she plans to travel to [place] soon.   MEDICAL HISTORY: . 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 Fontaine, OBGYN).   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   Sensorineural hearing loss (SNHL) of both ears    Patient wears biaural hearing aids   Stress incontinence, female     USI    SURGICAL HISTORY: Past Surgical History:  Procedure Laterality Date   BASAL CELL CARCINOMA EXCISION     CATARACT EXTRACTION     Cyst removed from chest     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"    SOCIAL HISTORY: Social History   Socioeconomic History   Marital status: Widowed    Spouse name: Not on file   Number of children: 3   Years of education: 46   Highest education level: Master's degree (e.g., MA, MS, MEng, MEd, MSW, MBA)  Occupational History   Occupation: Tourist information centre manager    Comment: Retired  Tobacco Use   Smoking status: Former    Current packs/day: 0.50    Average packs/day: 0.5 packs/day for 67.2 years (33.6 ttl pk-yrs)    Types: Cigarettes    Start date: 04/1956   Smokeless tobacco: Never  Vaping Use   Vaping status: Never Used  Substance and Sexual Activity   Alcohol use: Yes    Comment: occassionally   Drug use: No   Sexual activity: Never    Comment: 1st intercourse 82 yo-Fewer than 5 partners  Other Topics Concern   Not on file  Social History Narrative   Widowed, lives alone though a daughter lives next door.   3 children: [name]; [name], Lawernce Pitts   Activities: reading, interacting with her two cats, playing cards, corresponding with others (email, snail mail), needlework, cooking   Patient indicated on Geri Assessment questionnaire that she would want her daughter, Clelia Croft to make medical decisions on her behalf should she be unable. [phone]).  The patient gives permission to speak to Ms Romnet about issue around the patient's health.      Patient lives in a townhouse.  13 steps to get into home.  Home has two levels.    Assists Devices in home: Grab Bars   Regular exercise: Yoga once a week   Transportation: Patient drives   Social Drivers of Health   Financial Resource Strain: Low Risk  (01/11/2023)   Overall Financial Resource Strain (CARDIA)    Difficulty of Paying Living Expenses: Not hard  at all  Food Insecurity: No Food Insecurity (01/11/2023)   Hunger Vital Sign    Worried About Running Out of Food in the Last Year: Never true    Ran Out of Food in the Last Year: Never true  Transportation Needs: No Transportation Needs (01/11/2023)   PRAPARE - Administrator, Civil Service (Medical): No    Lack of Transportation (Non-Medical): No  Physical Activity: Insufficiently Active (01/11/2023)   Exercise Vital Sign    Days of Exercise per Week: 4 days    Minutes of Exercise per Session: 30 min  Stress: No Stress Concern Present (01/11/2023)   [employer] of Occupational Health - Occupational Stress Questionnaire    Feeling of Stress : Not at all  Social Connections: Moderately Integrated (01/11/2023)   Social Connection and Isolation Panel [NHANES]    Frequency of Communication with Friends and Family: More than  three times a week    Frequency of Social Gatherings with Friends and Family: Twice a week    Attends Religious Services: More than 4 times per year    Active Member of [employer] or Organizations: Yes    Attends Banker Meetings: More than 4 times per year    Marital Status: Widowed  Intimate Partner Violence: Not At Risk (12/30/2022)   Humiliation, Afraid, Rape, and Kick questionnaire    Fear of Current or Ex-Partner: No    Emotionally Abused: No    Physically Abused: No    Sexually Abused: No    FAMILY HISTORY: Family History  Problem Relation Age of Onset   Hypertension Mother    Heart disease Paternal Grandmother    Heart disease Paternal Grandfather    Cancer Brother        Leukemia    ALLERGIES:  is allergic to lisinopril, ephedrine-guaifenesin, and rituximab.  MEDICATIONS:  Current Outpatient Medications  Medication Sig Dispense Refill   albuterol (VENTOLIN HFA) 108 (90 Base) MCG/ACT inhaler Inhale 2 puffs into the lungs every 6 (six) hours as needed for wheezing or shortness of breath. 18 g 5   calcium carbonate (OSCAL) 1500  (600 Ca) MG TABS tablet Take 600 mg by mouth 2 (two) times daily.     Cholecalciferol (VITAMIN D3) 125 MCG (5000 UT) CAPS Take 125 mcg by mouth daily.     Cyanocobalamin (VITAMIN B-12 PO) Take 1 tablet by mouth daily.     denosumab (PROLIA) 60 MG/ML SOSY injection Inject 60 mg into the skin every 6 (six) months.     diclofenac Sodium (VOLTAREN) 1 % GEL Apply 2 g topically 4 (four) times daily. 50 g 2   ELIQUIS 5 MG TABS tablet TAKE 1 TABLET BY MOUTH TWICE A DAY 60 tablet 4   GEMTESA 75 MG TABS Take 1 tablet by mouth daily.     hydrochlorothiazide (HYDRODIURIL) 25 MG tablet TAKE 1 TABLET (25 MG TOTAL) BY MOUTH DAILY. 90 tablet 3   rosuvastatin (CRESTOR) 20 MG tablet TAKE 1 TABLET BY MOUTH EVERY DAY 90 tablet 3   Tiotropium Bromide-Olodaterol (STIOLTO RESPIMAT) 2.5-2.5 MCG/ACT AERS INHALE 2 PUFFS BY MOUTH INTO THE LUNGS DAILY 4 g 11   No current facility-administered medications for this visit.    REVIEW OF SYSTEMS:    10 Point review of Systems was done is negative except as noted above.   PHYSICAL EXAMINATION: ECOG PERFORMANCE STATUS: 1 - Symptomatic but completely ambulatory  . Vitals:   07/05/23 0909  BP: (!) 123/35  Pulse: 77  Resp: 18  Temp: 97.7 F (36.5 C)  SpO2: 94%   Filed Weights   07/05/23 0909  Weight: 170 lb 11.2 oz (77.4 kg)   .Body mass index is 34.48 kg/m.  GENERAL:alert, in no acute distress and comfortable SKIN: no acute rashes, no significant lesions EYES: conjunctiva are pink and non-injected, sclera anicteric OROPHARYNX: MMM, no exudates, no oropharyngeal erythema or ulceration NECK: supple, no JVD LYMPH:  no palpable lymphadenopathy in the cervical, axillary or inguinal regions LUNGS: clear to auscultation b/l with normal respiratory effort HEART: regular rate & rhythm ABDOMEN:  normoactive bowel sounds , non tender, not distended. Extremity: no pedal edema PSYCH: alert & oriented x 3 with fluent speech NEURO: no focal motor/sensory deficits    LABORATORY DATA:  I have reviewed the data as listed .    Latest Ref Rng & Units 07/05/2023    8:42 AM 05/29/2023  8:59 AM 01/17/2023    8:42 AM  CBC  WBC 4.0 - 10.5 K/uL 6.3  7.7  6.3   Hemoglobin 12.0 - 15.0 g/dL 96.0  45.4  09.8   Hematocrit 36.0 - 46.0 % 35.5  36.1  37.2   Platelets 150 - 400 K/uL 232  206  226    .    Latest Ref Rng & Units 07/05/2023    8:42 AM 05/29/2023    8:59 AM 05/11/2023   11:32 AM  CMP  Glucose 70 - 99 mg/dL 119  147  829   BUN 8 - 23 mg/dL 24  21  23    Creatinine 0.44 - 1.00 mg/dL 5.62  1.30  8.65   Sodium 135 - 145 mmol/L 139  138  144   Potassium 3.5 - 5.1 mmol/L 3.6  3.8  4.1   Chloride 98 - 111 mmol/L 103  102  102   CO2 22 - 32 mmol/L 28  28  26    Calcium 8.9 - 10.3 mg/dL 9.1  9.3  9.4   Total Protein 6.5 - 8.1 g/dL 6.9  6.7    Total Bilirubin 0.0 - 1.2 mg/dL 0.3  0.5    Alkaline Phos 38 - 126 U/L 43  48    AST 15 - 41 U/L 21  20    ALT 0 - 44 U/L 15  12     . Lab Results  Component Value Date   LDH 165 07/05/2023   Component     Latest Ref Rng 08/04/2022  Vitamin D, 25-Hydroxy     30 - 100 ng/mL 49.94   HIV Screen 4th Generation wRfx     Non Reactive  Non Reactive   Hepatitis B Surface Ag     NON REACTIVE  NON REACTIVE   Hep B Core Total Ab     NON REACTIVE  NON REACTIVE   HCV Ab     NON REACTIVE  NON REACTIVE      Needle core biopsy 07/27/2022:   Breast Ultrasound 07/20/2022:  Ultrasound guided biopsy:  Ultrasound guided biopsy:       RADIOGRAPHIC STUDIES: I have personally reviewed the radiological images as listed and agreed with the findings in the report. NM PET Image Restag (PS) Skull Base To Thigh Result Date: 06/29/2023 CLINICAL DATA:  Subsequent treatment strategy for non-Hodgkin's lymphoma. EXAM: NUCLEAR MEDICINE PET SKULL BASE TO THIGH TECHNIQUE: 8.5 mCi F-18 FDG was injected intravenously. Full-ring PET imaging was performed from the skull base to thigh after the radiotracer. CT data was obtained and  used for attenuation correction and anatomic localization. Fasting blood glucose: 124 mg/dl COMPARISON:  [id number] FINDINGS: Mediastinal blood pool activity: SUV max 2.5 Liver activity: SUV max 3.3 NECK: Right frontal lobe hypo activity on image 4 of the PET data compatible with remote infarct, no change from 04/25/2022 CT scan. Shotty bilateral intraparotid, level IIa, level IIb, level III, level IV, level Ia and Ib, and level V lymph nodes are again observed. Index right level V lymph node measuring 1.0 cm in short axis on image 35 series 4 having a maximum SUV of 4.9. This lymph node previously measured 1.0 cm in short axis with maximum SUV 3.0 (Deauville 3). Incidental CT findings: Exophytic 2.5 cm right posterior thyroid nodule on image 47 series 4. No associated hypermetabolic activity. Recommend thyroid US (Ref: J Am Coll Radiol. 2015 Feb;12(2): 143-50). CHEST: Hypermetabolic bilateral axillary, bilateral subpectoral, left supraclavicular, prevascular, paratracheal, and periaortic lymph  nodes are identified. Index left axillary node 1.7 cm in short axis on image 56 series 4 with maximum SUV 5.3 (Deauville 4). This previously measured 1.7 cm in short axis with maximum SUV 4.7). Index AP window lymph node 1.7 cm in short axis with maximum SUV 3.6 (Deauville 4). This previously measured 2.1 cm by my measurements with maximum SUV 5.0. Incidental CT findings: Coronary, aortic arch, and branch vessel atherosclerotic vascular disease. Mild cardiomegaly. Small type 1 hiatal hernia. ABDOMEN/PELVIS: Hypermetabolic and pathologically enlarged retroperitoneal, iliac, and inguinal lymph nodes bilaterally. Index right inguinal node 2.9 cm in short axis with maximum SUV 6.5 (Deauville 4). This was previously 2.7 cm in short axis by my measurement with maximum SUV 5.5. Index left external iliac node 2.2 cm in short axis with maximum SUV 5.7 (Deauville 4). This previously measured 2.3 cm in short axis with maximum SUV 5.7  (essentially stable). Scattered and also confluent mesenteric adenopathy measuring up to 2.4 cm in short axis on image 144 series 4 with maximum local SUV 3.8 (Deauville 4). Previously measured up to 2.8 cm with maximum SUV of 4.1. No splenomegaly or hypermetabolic splenic activity. Incidental CT findings: Atherosclerosis is present, including aortoiliac atherosclerotic disease. Substantial atheromatous vascular calcification proximally in the superior mesenteric artery. Small infrarenal abdominal aortic aneurysm 3.1 cm in diameter on image 122 series 4. Sigmoid colon diverticulosis. SKELETON: No significant abnormal hypermetabolic activity in this region. Incidental CT findings: Grade 1 anterolisthesis at L4-5 and L5-S1. Cervical, thoracic, and lumbar spondylosis. IMPRESSION: 1. Hypermetabolic and pathologically enlarged lymph nodes in the neck, chest, abdomen, and pelvis, compatible with lymphoma. Lymph node size and activity mixed compared to prior exam, on balance roughly stable, with Deauville 3 activity in the neck and Deauville 4 activity in the chest and abdomen/pelvis. 2. No splenomegaly or hypermetabolic splenic activity. 3. Small infrarenal abdominal aortic aneurysm 3.1 cm in diameter. Recommend follow-up ultrasound every 3 years. 4. 2.5 cm right posterior thyroid nodule. Recommend thyroid US (ref: J Am Coll Radiol. 2015 Feb;12(2): 143-50). 5. Coronary, aortic arch, systemic, and branch vessel atherosclerotic vascular disease. Mild cardiomegaly. Aortic Atherosclerosis (ICD10-I70.0). 6. Small type 1 hiatal hernia. 7. Sigmoid colon diverticulosis. 8. Grade 1 anterolisthesis at L4-5 and L5-S1. 9. Cervical, thoracic, and lumbar spondylosis. 10. Remote right frontal lobe infarct. Electronically Signed   By: Gaylyn Rong M.D.   On: 06/29/2023 16:01    ASSESSMENT & PLAN:   Wonderful 83 y.o. female with:  Stage III/IV low grade follicular lymphoma incidentally noted LNadenopathy on routine  mammogram. History of asymptomatic stroke  Hypertension  Pure hypercholesterolemia  Contusion of right shoulder region  6. Acute DVT of left peroneal veins  PLAN:   -Discussed lab results on 07/05/23 in detail with patient. CBC stable, showed WBC of 6.3K, hemoglobin of 11.9, and platelets of 232K. -her lymphoma is not affecting her blood counts at this time -CMP stable -PET scan from 06/12/2023 showed mixed response and is fairly stable with enlarged lymph nodes in the neck, chest, abdomen, and pelvis. There are no findings of bulkiness or involvement of important organs. -No indication to initiate chemotherapy at this time.  -would not recommend bendamustine at this time -I would recommend holding off on combining Rituxan and Revlimid at this time due to previous history of asymptomatic stroke and DVT -recommend only maintenance Rituxan at this time given extensive LNadenopathy and lower extremity swelling. -discussed option of using compression socks to help keep leg swelling down, especially when moving around frequently -recommend  staying well-hydrated and staying physically active to improve night sweats -discussed that high-dose steroids should also improve inflammation from arthritis  FOLLOW-UP: RTC for next dose of maintenance Rituxan in 60 days with labs and MD visit  The total time spent in the appointment was 30 minutes* .  All of the patient's questions were answered with apparent satisfaction. The patient knows to call the clinic with any problems, questions or concerns.   Wyvonnia Lora MD MS AAHIVMS [facility] Hematology/Oncology Physician [facility]  .*Total Encounter Time as defined by the Centers for Medicare and Medicaid Services includes, in addition to the face-to-face time of a patient visit (documented in the note above) non-face-to-face time: obtaining and reviewing outside history, ordering and reviewing medications, tests or procedures, care  coordination (communications with other health care professionals or caregivers) and documentation in the medical record.    I,Mitra Faeizi,acting as a Neurosurgeon for Wyvonnia Lora, MD.,have documented all relevant documentation on the behalf of Wyvonnia Lora, MD,as directed by  Wyvonnia Lora, MD while in the presence of Wyvonnia Lora, MD.  .I have reviewed the above documentation for accuracy and completeness, and I agree with the above. [name] MD

## 2023-07-05 NOTE — Patient Instructions (Signed)
 [facility] - A DEPT OF [facility]  Discharge Instructions: Thank you for choosing [facility] to provide your oncology and hematology care.   If you have a lab appointment with the Cancer Center, please go directly to the Cancer Center and check in at the registration area.   Wear comfortable clothing and clothing appropriate for easy access to any Portacath or PICC line.   We strive to give you quality time with your provider. You may need to reschedule your appointment if you arrive late (15 or more minutes).  Arriving late affects you and other patients whose appointments are after yours.  Also, if you miss three or more appointments without notifying the office, you may be dismissed from the clinic at the provider's discretion.      For prescription refill requests, have your pharmacy contact our office and allow 72 hours for refills to be completed.    Today you received the following chemotherapy and/or immunotherapy agent: Rituximab   To help prevent nausea and vomiting after your treatment, we encourage you to take your nausea medication as directed.  BELOW ARE SYMPTOMS THAT SHOULD BE REPORTED IMMEDIATELY: *FEVER GREATER THAN 100.4 F (38 C) OR HIGHER *CHILLS OR SWEATING *NAUSEA AND VOMITING THAT IS NOT CONTROLLED WITH YOUR NAUSEA MEDICATION *UNUSUAL SHORTNESS OF BREATH *UNUSUAL BRUISING OR BLEEDING *URINARY PROBLEMS (pain or burning when urinating, or frequent urination) *BOWEL PROBLEMS (unusual diarrhea, constipation, pain near the anus) TENDERNESS IN MOUTH AND THROAT WITH OR WITHOUT PRESENCE OF ULCERS (sore throat, sores in mouth, or a toothache) UNUSUAL RASH, SWELLING OR PAIN  UNUSUAL VAGINAL DISCHARGE OR ITCHING   Items with * indicate a potential emergency and should be followed up as soon as possible or go to the Emergency Department if any problems should occur.  Please show the CHEMOTHERAPY ALERT CARD or IMMUNOTHERAPY  ALERT CARD at check-in to the Emergency Department and triage nurse.  Should you have questions after your visit or need to cancel or reschedule your appointment, please contact [facility] - [facility]  Dept: [phone]  and follow the prompts.  Office hours are 8:00 a.m. to 4:30 p.m. Monday - Friday. Please note that voicemails left after 4:00 p.m. may not be returned until the following business day.  We are closed weekends and major holidays. You have access to a nurse at all times for urgent questions. Please call the main number to the clinic Dept: [phone] and follow the prompts.   For any non-urgent questions, you may also contact your provider using MyChart. We now offer e-Visits for anyone 63 and older to request care online for non-urgent symptoms. For details visit [email or web address].   Also download the MyChart app! Go to the app store, search "MyChart", open the app, select [place], and log in with your MyChart username and password. Rituximab Injection What is this medication? RITUXIMAB (ri TUX i mab) treats leukemia and lymphoma. It works by blocking a protein that causes cancer cells to grow and multiply. This helps to slow or stop the spread of cancer cells. It may also be used to treat autoimmune conditions, such as arthritis. It works by slowing down an overactive immune system. It is a monoclonal antibody. This medicine may be used for other purposes; ask your health care provider or pharmacist if you have questions. COMMON BRAND NAME(S): RIABNI, Rituxan, RUXIENCE, truxima What should I tell  my care team before I take this medication? They need to know if you have any of these conditions: Chest pain Heart disease Immune system problems Infection, such as chickenpox, cold sores, hepatitis B, herpes Irregular heartbeat or rhythm Kidney disease Low blood counts, such as low white cells, platelets, red cells Lung  disease Recent or upcoming vaccine An unusual or allergic reaction to rituximab, other medications, foods, dyes, or preservatives Pregnant or trying to get pregnant Breast-feeding How should I use this medication? This medication is injected into a vein. It is given by a care team in a hospital or clinic setting. A special MedGuide will be given to you before each treatment. Be sure to read this information carefully each time. Talk to your care team about the use of this medication in children. While this medication may be prescribed for children as young as 6 months for selected conditions, precautions do apply. Overdosage: If you think you have taken too much of this medicine contact a poison control center or emergency room at once. NOTE: This medicine is only for you. Do not share this medicine with others. What if I miss a dose? Keep appointments for follow-up doses. It is important not to miss your dose. Call your care team if you are unable to keep an appointment. What may interact with this medication? Do not take this medication with any of the following: Live vaccines This medication may also interact with the following: Cisplatin This list may not describe all possible interactions. Give your health care provider a list of all the medicines, herbs, non-prescription drugs, or dietary supplements you use. Also tell them if you smoke, drink alcohol, or use illegal drugs. Some items may interact with your medicine. What should I watch for while using this medication? Your condition will be monitored carefully while you are receiving this medication. You may need blood work while taking this medication. This medication can cause serious infusion reactions. To reduce the risk your care team may give you other medications to take before receiving this one. Be sure to follow the directions from your care team. This medication may increase your risk of getting an infection. Call your care  team for advice if you get a fever, chills, sore throat, or other symptoms of a cold or flu. Do not treat yourself. Try to avoid being around people who are sick. Call your care team if you are around anyone with measles, chickenpox, or if you develop sores or blisters that do not heal properly. Avoid taking medications that contain aspirin, acetaminophen, ibuprofen, naproxen, or ketoprofen unless instructed by your care team. These medications may hide a fever. This medication may cause serious skin reactions. They can happen weeks to months after starting the medication. Contact your care team right away if you notice fevers or flu-like symptoms with a rash. The rash may be red or purple and then turn into blisters or peeling of the skin. You may also notice a red rash with swelling of the face, lips, or lymph nodes in your neck or under your arms. In some patients, this medication may cause a serious brain infection that may cause death. If you have any problems seeing, thinking, speaking, walking, or standing, tell your care team right away. If you cannot reach your care team, urgently seek another source of medical care. Talk to your care team if you may be pregnant. Serious birth defects can occur if you take this medication during pregnancy and for 12 months  after the last dose. You will need a negative pregnancy test before starting this medication. Contraception is recommended while taking this medication and for 12 months after the last dose. Your care team can help you find the option that works for you. Do not breastfeed while taking this medication and for at least 6 months after the last dose. What side effects may I notice from receiving this medication? Side effects that you should report to your care team as soon as possible: Allergic reactions or angioedema--skin rash, itching or hives, swelling of the face, eyes, lips, tongue, arms, or legs, trouble swallowing or breathing Bowel  blockage--stomach cramping, unable to have a bowel movement or pass gas, loss of appetite, vomiting Dizziness, loss of balance or coordination, confusion or trouble speaking Heart attack--pain or tightness in the chest, shoulders, arms, or jaw, nausea, shortness of breath, cold or clammy skin, feeling faint or lightheaded Heart rhythm changes--fast or irregular heartbeat, dizziness, feeling faint or lightheaded, chest pain, trouble breathing Infection--fever, chills, cough, sore throat, wounds that don't heal, pain or trouble when passing urine, general feeling of discomfort or being unwell Infusion reactions--chest pain, shortness of breath or trouble breathing, feeling faint or lightheaded Kidney injury--decrease in the amount of urine, swelling of the ankles, hands, or feet Liver injury--right upper belly pain, loss of appetite, nausea, light-colored stool, dark yellow or brown urine, yellowing skin or eyes, unusual weakness or fatigue Redness, blistering, peeling, or loosening of the skin, including inside the mouth Stomach pain that is severe, does not go away, or gets worse Tumor lysis syndrome (TLS)--nausea, vomiting, diarrhea, decrease in the amount of urine, dark urine, unusual weakness or fatigue, confusion, muscle pain or cramps, fast or irregular heartbeat, joint pain Side effects that usually do not require medical attention (report to your care team if they continue or are bothersome): Headache Joint pain Nausea Runny or stuffy nose Unusual weakness or fatigue This list may not describe all possible side effects. Call your doctor for medical advice about side effects. You may report side effects to FDA at 1-800-FDA-1088. Where should I keep my medication? This medication is given in a hospital or clinic. It will not be stored at home. NOTE: This sheet is a summary. It may not cover all possible information. If you have questions about this medicine, talk to your doctor, pharmacist,  or health care provider.  2024 Elsevier/Gold Standard (2021-08-26 00:00:00)

## 2023-07-05 NOTE — Progress Notes (Signed)
 Patient seen by Dr. Addison Naegeli are not all within treatment parameters.   Dr Candise Che aware BP 123/38  Labs reviewed: and are within treatment parameters.  Per physician team, patient is ready for treatment and there are NO modifications to the treatment plan.

## 2023-07-07 DIAGNOSIS — H353122 Nonexudative age-related macular degeneration, left eye, intermediate dry stage: Secondary | ICD-10-CM | POA: Diagnosis not present

## 2023-07-07 DIAGNOSIS — H353211 Exudative age-related macular degeneration, right eye, with active choroidal neovascularization: Secondary | ICD-10-CM | POA: Diagnosis not present

## 2023-07-07 DIAGNOSIS — H35371 Puckering of macula, right eye: Secondary | ICD-10-CM | POA: Diagnosis not present

## 2023-07-07 DIAGNOSIS — Z961 Presence of intraocular lens: Secondary | ICD-10-CM | POA: Diagnosis not present

## 2023-07-07 DIAGNOSIS — H43813 Vitreous degeneration, bilateral: Secondary | ICD-10-CM | POA: Diagnosis not present

## 2023-07-07 DIAGNOSIS — H31091 Other chorioretinal scars, right eye: Secondary | ICD-10-CM | POA: Diagnosis not present

## 2023-07-07 DIAGNOSIS — H53032 Strabismic amblyopia, left eye: Secondary | ICD-10-CM | POA: Diagnosis not present

## 2023-07-11 ENCOUNTER — Encounter: Payer: Self-pay | Admitting: Hematology

## 2023-07-12 ENCOUNTER — Other Ambulatory Visit: Payer: Self-pay | Admitting: Family Medicine

## 2023-07-17 DIAGNOSIS — Z1231 Encounter for screening mammogram for malignant neoplasm of breast: Secondary | ICD-10-CM | POA: Diagnosis not present

## 2023-07-17 LAB — HM MAMMOGRAPHY

## 2023-07-18 ENCOUNTER — Encounter: Payer: Self-pay | Admitting: Family Medicine

## 2023-07-19 DIAGNOSIS — D225 Melanocytic nevi of trunk: Secondary | ICD-10-CM | POA: Diagnosis not present

## 2023-07-19 DIAGNOSIS — L570 Actinic keratosis: Secondary | ICD-10-CM | POA: Diagnosis not present

## 2023-07-19 DIAGNOSIS — L858 Other specified epidermal thickening: Secondary | ICD-10-CM | POA: Diagnosis not present

## 2023-07-19 DIAGNOSIS — Z85828 Personal history of other malignant neoplasm of skin: Secondary | ICD-10-CM | POA: Diagnosis not present

## 2023-07-19 DIAGNOSIS — L821 Other seborrheic keratosis: Secondary | ICD-10-CM | POA: Diagnosis not present

## 2023-08-03 ENCOUNTER — Other Ambulatory Visit: Payer: Self-pay

## 2023-08-03 DIAGNOSIS — J449 Chronic obstructive pulmonary disease, unspecified: Secondary | ICD-10-CM

## 2023-08-03 MED ORDER — ALBUTEROL SULFATE HFA 108 (90 BASE) MCG/ACT IN AERS: 2.0000 | INHALATION_SPRAY | Freq: Four times a day (QID) | RESPIRATORY_TRACT | 5 refills | Status: AC | PRN

## 2023-08-03 NOTE — Telephone Encounter (Signed)
 Patient calls nurse line regarding timeframe of next appointment with Dr. McDiarmid.   She last saw PCP on 03/09/23. I was unable to find a follow up time in chart review.   Patient does not have any concerns at this time, other than requesting a refill on her albuterol inhaler.   I have pended the refill to this encounter.   Please advise when you would like patient to schedule follow up visit.   Elsie Halo, RN

## 2023-08-17 DIAGNOSIS — H31091 Other chorioretinal scars, right eye: Secondary | ICD-10-CM | POA: Diagnosis not present

## 2023-08-17 DIAGNOSIS — H43813 Vitreous degeneration, bilateral: Secondary | ICD-10-CM | POA: Diagnosis not present

## 2023-08-17 DIAGNOSIS — H353211 Exudative age-related macular degeneration, right eye, with active choroidal neovascularization: Secondary | ICD-10-CM | POA: Diagnosis not present

## 2023-08-17 DIAGNOSIS — H35371 Puckering of macula, right eye: Secondary | ICD-10-CM | POA: Diagnosis not present

## 2023-08-17 DIAGNOSIS — H353122 Nonexudative age-related macular degeneration, left eye, intermediate dry stage: Secondary | ICD-10-CM | POA: Diagnosis not present

## 2023-08-17 DIAGNOSIS — Z961 Presence of intraocular lens: Secondary | ICD-10-CM | POA: Diagnosis not present

## 2023-08-17 DIAGNOSIS — H53032 Strabismic amblyopia, left eye: Secondary | ICD-10-CM | POA: Diagnosis not present

## 2023-08-21 ENCOUNTER — Other Ambulatory Visit: Payer: Self-pay | Admitting: *Deleted

## 2023-08-21 DIAGNOSIS — C8294 Follicular lymphoma, unspecified, lymph nodes of axilla and upper limb: Secondary | ICD-10-CM

## 2023-08-29 ENCOUNTER — Inpatient Hospital Stay

## 2023-08-29 ENCOUNTER — Inpatient Hospital Stay: Payer: Medicare PPO

## 2023-08-29 ENCOUNTER — Inpatient Hospital Stay: Payer: Medicare PPO | Attending: Hematology | Admitting: Hematology

## 2023-08-29 VITALS — BP 123/41 | HR 84 | Temp 98.0°F | Resp 20

## 2023-08-29 VITALS — BP 137/56 | HR 85 | Temp 97.5°F | Resp 18 | Wt 169.2 lb

## 2023-08-29 DIAGNOSIS — Z7189 Other specified counseling: Secondary | ICD-10-CM

## 2023-08-29 DIAGNOSIS — Z87891 Personal history of nicotine dependence: Secondary | ICD-10-CM | POA: Insufficient documentation

## 2023-08-29 DIAGNOSIS — C8294 Follicular lymphoma, unspecified, lymph nodes of axilla and upper limb: Secondary | ICD-10-CM | POA: Insufficient documentation

## 2023-08-29 DIAGNOSIS — Z79899 Other long term (current) drug therapy: Secondary | ICD-10-CM | POA: Insufficient documentation

## 2023-08-29 DIAGNOSIS — Z5112 Encounter for antineoplastic immunotherapy: Secondary | ICD-10-CM | POA: Insufficient documentation

## 2023-08-29 LAB — CBC WITH DIFFERENTIAL (CANCER CENTER ONLY)
Abs Immature Granulocytes: 0.05 10*3/uL (ref 0.00–0.07)
Basophils Absolute: 0.1 10*3/uL (ref 0.0–0.1)
Basophils Relative: 1 %
Eosinophils Absolute: 0.2 10*3/uL (ref 0.0–0.5)
Eosinophils Relative: 2 %
HCT: 35.5 % — ABNORMAL LOW (ref 36.0–46.0)
Hemoglobin: 12 g/dL (ref 12.0–15.0)
Immature Granulocytes: 1 %
Lymphocytes Relative: 8 %
Lymphs Abs: 0.6 10*3/uL — ABNORMAL LOW (ref 0.7–4.0)
MCH: 31.1 pg (ref 26.0–34.0)
MCHC: 33.8 g/dL (ref 30.0–36.0)
MCV: 92 fL (ref 80.0–100.0)
Monocytes Absolute: 1 10*3/uL (ref 0.1–1.0)
Monocytes Relative: 12 %
Neutro Abs: 6.1 10*3/uL (ref 1.7–7.7)
Neutrophils Relative %: 76 %
Platelet Count: 227 10*3/uL (ref 150–400)
RBC: 3.86 MIL/uL — ABNORMAL LOW (ref 3.87–5.11)
RDW: 15.1 % (ref 11.5–15.5)
WBC Count: 7.9 10*3/uL (ref 4.0–10.5)
nRBC: 0 % (ref 0.0–0.2)

## 2023-08-29 LAB — CMP (CANCER CENTER ONLY)
ALT: 18 U/L (ref 0–44)
AST: 23 U/L (ref 15–41)
Albumin: 4.3 g/dL (ref 3.5–5.0)
Alkaline Phosphatase: 42 U/L (ref 38–126)
Anion gap: 7 (ref 5–15)
BUN: 24 mg/dL — ABNORMAL HIGH (ref 8–23)
CO2: 31 mmol/L (ref 22–32)
Calcium: 9.7 mg/dL (ref 8.9–10.3)
Chloride: 101 mmol/L (ref 98–111)
Creatinine: 0.86 mg/dL (ref 0.44–1.00)
GFR, Estimated: >60 mL/min (ref >60)
Glucose, Bld: 114 mg/dL — ABNORMAL HIGH (ref 70–99)
Potassium: 3.9 mmol/L (ref 3.5–5.1)
Sodium: 139 mmol/L (ref 135–145)
Total Bilirubin: 0.4 mg/dL (ref 0.0–1.2)
Total Protein: 7.1 g/dL (ref 6.5–8.1)

## 2023-08-29 LAB — LACTATE DEHYDROGENASE: LDH: 182 U/L (ref 98–192)

## 2023-08-29 MED ORDER — FAMOTIDINE IN NACL 20-0.9 MG/50ML-% IV SOLN
20.0000 mg | Freq: Once | INTRAVENOUS | Status: AC
  Administered 2023-08-29: 20 mg via INTRAVENOUS
  Filled 2023-08-29: qty 50

## 2023-08-29 MED ORDER — ACETAMINOPHEN 500 MG PO TABS
1000.0000 mg | ORAL_TABLET | Freq: Once | ORAL | Status: AC
  Administered 2023-08-29: 1000 mg via ORAL
  Filled 2023-08-29: qty 2

## 2023-08-29 MED ORDER — MONTELUKAST SODIUM 10 MG PO TABS
10.0000 mg | ORAL_TABLET | Freq: Once | ORAL | Status: AC
  Administered 2023-08-29: 10 mg via ORAL
  Filled 2023-08-29: qty 1

## 2023-08-29 MED ORDER — SODIUM CHLORIDE 0.9 % IV SOLN: Freq: Once | INTRAVENOUS | Status: AC

## 2023-08-29 MED ORDER — RITUXIMAB-PVVR CHEMO 500 MG/50ML IV SOLN
375.0000 mg/m2 | Freq: Once | INTRAVENOUS | Status: AC
  Administered 2023-08-29: 100 mg via INTRAVENOUS
  Filled 2023-08-29: qty 50

## 2023-08-29 MED ORDER — METHYLPREDNISOLONE SODIUM SUCC 125 MG IJ SOLR
125.0000 mg | Freq: Once | INTRAMUSCULAR | Status: AC
  Administered 2023-08-29: 125 mg via INTRAVENOUS
  Filled 2023-08-29: qty 2

## 2023-08-29 MED ORDER — SODIUM CHLORIDE 0.9% FLUSH: 10.0000 mL | INTRAVENOUS | Status: DC | PRN

## 2023-08-29 MED ORDER — CETIRIZINE HCL 10 MG/ML IV SOLN
10.0000 mg | Freq: Once | INTRAVENOUS | Status: AC
  Administered 2023-08-29: 10 mg via INTRAVENOUS
  Filled 2023-08-29: qty 1

## 2023-08-29 NOTE — Patient Instructions (Signed)
 [facility] - A DEPT OF [facility]  Discharge Instructions: Thank you for choosing [facility] to provide your oncology and hematology care.   If you have a lab appointment with the Cancer Center, please go directly to the Cancer Center and check in at the registration area.   Wear comfortable clothing and clothing appropriate for easy access to any Portacath or PICC line.   We strive to give you quality time with your provider. You may need to reschedule your appointment if you arrive late (15 or more minutes).  Arriving late affects you and other patients whose appointments are after yours.  Also, if you miss three or more appointments without notifying the office, you may be dismissed from the clinic at the provider's discretion.      For prescription refill requests, have your pharmacy contact our office and allow 72 hours for refills to be completed.    Today you received the following chemotherapy and/or immunotherapy agents: riTUXimab -pvvr (RUXIENCE )     To help prevent nausea and vomiting after your treatment, we encourage you to take your nausea medication as directed.  BELOW ARE SYMPTOMS THAT SHOULD BE REPORTED IMMEDIATELY: *FEVER GREATER THAN 100.4 F (38 C) OR HIGHER *CHILLS OR SWEATING *NAUSEA AND VOMITING THAT IS NOT CONTROLLED WITH YOUR NAUSEA MEDICATION *UNUSUAL SHORTNESS OF BREATH *UNUSUAL BRUISING OR BLEEDING *URINARY PROBLEMS (pain or burning when urinating, or frequent urination) *BOWEL PROBLEMS (unusual diarrhea, constipation, pain near the anus) TENDERNESS IN MOUTH AND THROAT WITH OR WITHOUT PRESENCE OF ULCERS (sore throat, sores in mouth, or a toothache) UNUSUAL RASH, SWELLING OR PAIN  UNUSUAL VAGINAL DISCHARGE OR ITCHING   Items with * indicate a potential emergency and should be followed up as soon as possible or go to the Emergency Department if any problems should occur.  Please show the CHEMOTHERAPY ALERT CARD or  IMMUNOTHERAPY ALERT CARD at check-in to the Emergency Department and triage nurse.  Should you have questions after your visit or need to cancel or reschedule your appointment, please contact [facility] - A [facility] OF [facility]  Dept: [phone]  and follow the prompts.  Office hours are 8:00 a.m. to 4:30 p.m. Monday - Friday. Please note that voicemails left after 4:00 p.m. may not be returned until the following business day.  We are closed weekends and major holidays. You have access to a nurse at all times for urgent questions. Please call the main number to the clinic Dept: [phone] and follow the prompts.   For any non-urgent questions, you may also contact your provider using MyChart. We now offer e-Visits for anyone 65 and older to request care online for non-urgent symptoms. For details visit [email or web address].   Also download the MyChart app! Go to the app store, search "MyChart", open the app, select [facility], and log in with your MyChart username and password.

## 2023-08-29 NOTE — Progress Notes (Signed)
 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  HISTORY OF PRESENTING ILLNESS:   Heather Andrews is a wonderful 83 y.o. female who has been referred to us  by Garr Kalata, MD for evaluation and management of newly-diagnosed low-grade follicular lymphoma.   Today, she is accompanied by her daughter. She reports that she has had right LE edema for about 6 months. Upon examination, patient has bilateral LE edema, right more than left.   She also reports having a rash at one point. Patient denies any medication changes, long distance travel, or unusual activity. Patient denies any redness/pain in the area.  Patient denies back pain, recent infections, abdominal pain, change in bowel habits, fever, chills, or night sweats.  She does report that she tripped and suffered a fall in January 2024, causing her to hit the side of her head.  Patient denies any breast discomfort or swelling besides in her LE. She does however sometimes experience tingling in her hands.  Patient does confirm having a silent stroke at some point previously, but is unsure of when it occurred.   Patient does report that she is "slowing down" in general, which she attributes to her age. Patient denies any sudden new fatigue or other major limiting medical issues at this time. Patient does regualrly take vitamin D , but is unsure of how many units she takes. She also takes vitamin K. Patient denies any concern for varicose veins in the past. Patient reports that she will be traveling to [facility] [place] for a week and will leave May 4th.  INTERVAL HISTORY:  Heather Andrews is a 83 y.o. female here for continued evaluation and management of  follicular lymphoma.  Patient was last  seen by me on 07/05/2023 and complained of stable constant fatigue, night sweats once in a while, and left knee pain attributed to arthritis.   Ms Naab is here for continued follow-up and management of her follicular lymphoma, next dose of maintenance Rituxan . Patient notes no acute new symptoms since her last clinic visit.  No new lumps or bumps.  No new shortness of breath or chest pain.  No new abdominal pain or distention.  No new leg swelling.  No unexplained fevers chills or night sweats.  Her weight has been fairly stable around 170 pounds with good p.o. intake.   MEDICAL HISTORY: . 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 Fontaine, OBGYN).   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   Sensorineural hearing loss (SNHL) of both ears    Patient wears biaural hearing aids   Stress incontinence, female    USI    SURGICAL HISTORY: Past Surgical History:  Procedure Laterality Date   BASAL CELL CARCINOMA EXCISION     CATARACT EXTRACTION     Cyst removed from chest     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"    SOCIAL HISTORY: Social History   Socioeconomic History   Marital status: Widowed    Spouse name: Not on file   Number of children: 3   Years of education: 22   Highest education level: Master's degree (e.g., MA, MS, MEng, MEd, MSW, MBA)  Occupational History   Occupation: Tourist information centre manager     Comment: Retired  Tobacco Use   Smoking status: Former    Current packs/day: 0.50    Average packs/day: 0.5 packs/day for 67.4 years (33.7 ttl pk-yrs)    Types: Cigarettes    Start date: 04/1956   Smokeless tobacco: Never  Vaping Use   Vaping status: Never Used  Substance and Sexual Activity   Alcohol use: Yes    Comment: occassionally   Drug use: No   Sexual activity: Never    Comment: 1st intercourse 83 yo-Fewer than 5 partners  Other Topics Concern   Not on file  Social History Narrative   Widowed, lives alone though a daughter lives next door.   3 children: [name]; Dara Ear, Tonia Frankel   Activities: reading, interacting with her two cats, playing cards, corresponding with others (email, snail mail), needlework, cooking   Patient indicated on Geri Assessment questionnaire that she would want her daughter, Lynford Sarin to make medical decisions on her behalf should she be unable. [phone]).  The patient gives permission to speak to Ms Romnet about issue around the patient's health.      Patient lives in a townhouse.  13 steps to get into home.  Home has two levels.    Assists Devices in home: Grab Bars   Regular exercise: Yoga once a week   Transportation: Patient drives   Social Drivers of Health   Financial Resource Strain: Low Risk  (01/11/2023)   Overall Financial Resource Strain (CARDIA)    Difficulty of Paying Living Expenses: Not hard at all  Food Insecurity: No Food Insecurity (01/11/2023)   Hunger Vital Sign    Worried About Running Out of Food in the Last Year: Never true    Ran Out of Food in the Last Year: Never true  Transportation Needs: No Transportation Needs (01/11/2023)   PRAPARE - Administrator, Civil Service (Medical): No    Lack of Transportation (Non-Medical): No  Physical Activity: Insufficiently Active (01/11/2023)   Exercise Vital Sign    Days of Exercise per Week: 4 days    Minutes of Exercise per Session: 30 min   Stress: No Stress Concern Present (01/11/2023)   [employer] of Occupational Health - Occupational Stress Questionnaire    Feeling of Stress : Not at all  Social Connections: Moderately Integrated (01/11/2023)   Social Connection and Isolation Panel [NHANES]    Frequency of Communication with Friends and Family: More than three times a week    Frequency of Social Gatherings with Friends and Family: Twice a week    Attends Religious Services: More than 4 times per year    Active Member of [employer] or Organizations: Yes    Attends Banker Meetings: More than 4 times per year    Marital Status: Widowed  Intimate Partner Violence: Not At Risk (12/30/2022)   Humiliation, Afraid, Rape, and Kick questionnaire    Fear of  Current or Ex-Partner: No    Emotionally Abused: No    Physically Abused: No    Sexually Abused: No    FAMILY HISTORY: Family History  Problem Relation Age of Onset   Hypertension Mother    Heart disease Paternal Grandmother    Heart disease Paternal Grandfather    Cancer Brother        Leukemia    ALLERGIES:  is allergic to lisinopril, ephedrine-guaifenesin, and rituximab .  MEDICATIONS:  Current Outpatient Medications  Medication Sig Dispense Refill   albuterol  (VENTOLIN  HFA) 108 (90 Base) MCG/ACT inhaler Inhale 2 puffs into the lungs every 6 (six) hours as needed for wheezing or shortness of breath. 18 g 5   calcium  carbonate (OSCAL) 1500 (600 Ca) MG TABS tablet Take 600 mg by mouth 2 (two) times daily.     Cholecalciferol (VITAMIN D3) 125 MCG (5000 UT) CAPS Take 125 mcg by mouth daily.     Cyanocobalamin (VITAMIN B-12 PO) Take 1 tablet by mouth daily.     denosumab  (PROLIA ) 60 MG/ML SOSY injection Inject 60 mg into the skin every 6 (six) months.     diclofenac  Sodium (VOLTAREN ) 1 % GEL Apply 2 g topically 4 (four) times daily. 50 g 2   ELIQUIS  5 MG TABS tablet TAKE 1 TABLET BY MOUTH TWICE A DAY 60 tablet 4   FLUAD 0.5 ML injection      GEMTESA 75  MG TABS Take 1 tablet by mouth daily.     hydrochlorothiazide  (HYDRODIURIL ) 25 MG tablet TAKE 1 TABLET (25 MG TOTAL) BY MOUTH DAILY. 90 tablet 3   Menaquinone-7 (VITAMIN K2) 100 MCG CAPS as directed Orally     rosuvastatin  (CRESTOR ) 20 MG tablet TAKE 1 TABLET BY MOUTH EVERY DAY 90 tablet 3   SPIKEVAX syringe      Tiotropium Bromide-Olodaterol (STIOLTO RESPIMAT ) 2.5-2.5 MCG/ACT AERS INHALE 2 PUFFS BY MOUTH INTO THE LUNGS DAILY 4 g 11   No current facility-administered medications for this visit.    REVIEW OF SYSTEMS:    10 Point review of Systems was done is negative except as noted above.   PHYSICAL EXAMINATION: ECOG PERFORMANCE STATUS: 1 - Symptomatic but completely ambulatory  . Vitals:   08/29/23 1128  BP: (!) 137/56  Pulse: 85  Resp: 18  Temp: (!) 97.5 F (36.4 C)  SpO2: 95%    Filed Weights   08/29/23 1128  Weight: 169 lb 3.2 oz (76.7 kg)   .Body mass index is 34.17 kg/m.  GENERAL:alert, in no acute distress and comfortable SKIN: no acute rashes, no significant lesions EYES: conjunctiva are pink and non-injected, sclera anicteric OROPHARYNX: MMM, no exudates, no oropharyngeal erythema or ulceration NECK: supple, no JVD LYMPH:  no palpable lymphadenopathy in the cervical, axillary or inguinal regions LUNGS: clear to auscultation b/l with normal respiratory effort HEART: regular rate & rhythm ABDOMEN:  normoactive bowel sounds , non tender, not distended. Extremity: no pedal edema PSYCH: alert & oriented x 3 with fluent speech NEURO: no focal motor/sensory deficits   LABORATORY DATA:  I have reviewed the data as listed .    Latest Ref Rng & Units 08/29/2023   11:11 AM 07/05/2023    8:42 AM 05/29/2023    8:59 AM  CBC  WBC 4.0 - 10.5 K/uL 7.9  6.3  7.7   Hemoglobin 12.0 - 15.0 g/dL 16.1  09.6  04.5   Hematocrit 36.0 - 46.0 % 35.5  35.5  36.1   Platelets 150 - 400 K/uL  227  232  206    .    Latest Ref Rng & Units 08/29/2023   11:11 AM 07/05/2023    8:42 AM  05/29/2023    8:59 AM  CMP  Glucose 70 - 99 mg/dL 161  096  045   BUN 8 - 23 mg/dL 24  24  21    Creatinine 0.44 - 1.00 mg/dL 4.09  8.11  9.14   Sodium 135 - 145 mmol/L 139  139  138   Potassium 3.5 - 5.1 mmol/L 3.9  3.6  3.8   Chloride 98 - 111 mmol/L 101  103  102   CO2 22 - 32 mmol/L 31  28  28    Calcium  8.9 - 10.3 mg/dL 9.7  9.1  9.3   Total Protein 6.5 - 8.1 g/dL 7.1  6.9  6.7   Total Bilirubin 0.0 - 1.2 mg/dL 0.4  0.3  0.5   Alkaline Phos 38 - 126 U/L 42  43  48   AST 15 - 41 U/L 23  21  20    ALT 0 - 44 U/L 18  15  12     . Lab Results  Component Value Date   LDH 165 07/05/2023   Component     Latest Ref Rng 08/04/2022  Vitamin D , 25-Hydroxy     30 - 100 ng/mL 49.94   HIV Screen 4th Generation wRfx     Non Reactive  Non Reactive   Hepatitis B Surface Ag     NON REACTIVE  NON REACTIVE   Hep B Core Total Ab     NON REACTIVE  NON REACTIVE   HCV Ab     NON REACTIVE  NON REACTIVE      Needle core biopsy 07/27/2022:   Breast Ultrasound 07/20/2022:  Ultrasound guided biopsy:  Ultrasound guided biopsy:       RADIOGRAPHIC STUDIES: I have personally reviewed the radiological images as listed and agreed with the findings in the report. No results found.   ASSESSMENT & PLAN:   Wonderful 83 y.o. female with:  Stage III/IV low grade follicular lymphoma incidentally noted LNadenopathy on routine mammogram. History of asymptomatic stroke  Hypertension  Pure hypercholesterolemia  Contusion of right shoulder region  6. Acute DVT of left peroneal veins  PLAN:   -Discussed lab results from today, 08/29/23, in detail with patient CBC shows normal hemoglobin of 12 with a WBC count of 7.9 and platelet count of 227k CMP is within normal limits LDH within normal limits at 182 Patient has no lab or clinical evidence of follicular lymphoma progression at this time. Patient notes no significant notable toxicities from her previous dose of Rituxan . Will continue  maintenance Rituxan  every 60 days with same supportive medications. Will plan to repeat CT chest abdomen pelvis after about 1 year of maintenance treatment unless any new symptoms arise or lab changes occur that are concerning for lymphoma progression. - She was recommended to stay up to speed with her age-appropriate Vaccinations and cancer screening with her primary care physician - Recommended taking vitamin D  and B complex replacement  FOLLOW-UP: RTC for next dose of maintenance Rituxan  in 60 days with labs and MD visit   The total time spent in the appointment was 30 minutes* .  All of the patient's questions were answered with apparent satisfaction. The patient knows to call the clinic with any problems, questions or concerns.   Jacquelyn Matt MD MS AAHIVMS [facility] Hematology/Oncology Physician [facility]  .*Total Encounter Time as defined by the Centers for Medicare and Medicaid Services includes, in addition to the face-to-face time of a patient visit (documented in the note above) non-face-to-face time: obtaining and reviewing outside history, ordering and reviewing medications, tests or procedures, care coordination (communications with other health care professionals or caregivers) and documentation in the medical record.     I,Mitra Faeizi,acting as a Neurosurgeon for Jacquelyn Matt, MD.,have documented all relevant documentation on the behalf of Jacquelyn Matt, MD,as directed by  Jacquelyn Matt, MD while in the presence of Jacquelyn Matt, MD.  .I have reviewed the above documentation for accuracy and completeness, and I agree with the above..Karandeep Resende Kishore Ramelo Oetken MD .Mayli Covington Kishore Tanyon Alipio MD

## 2023-09-05 ENCOUNTER — Encounter: Payer: Self-pay | Admitting: Hematology

## 2023-09-24 ENCOUNTER — Other Ambulatory Visit: Payer: Self-pay | Admitting: Hematology

## 2023-09-25 ENCOUNTER — Encounter: Payer: Self-pay | Admitting: Hematology

## 2023-09-28 DIAGNOSIS — Z961 Presence of intraocular lens: Secondary | ICD-10-CM | POA: Diagnosis not present

## 2023-09-28 DIAGNOSIS — H35371 Puckering of macula, right eye: Secondary | ICD-10-CM | POA: Diagnosis not present

## 2023-09-28 DIAGNOSIS — H53032 Strabismic amblyopia, left eye: Secondary | ICD-10-CM | POA: Diagnosis not present

## 2023-09-28 DIAGNOSIS — H353122 Nonexudative age-related macular degeneration, left eye, intermediate dry stage: Secondary | ICD-10-CM | POA: Diagnosis not present

## 2023-09-28 DIAGNOSIS — H43813 Vitreous degeneration, bilateral: Secondary | ICD-10-CM | POA: Diagnosis not present

## 2023-09-28 DIAGNOSIS — H353211 Exudative age-related macular degeneration, right eye, with active choroidal neovascularization: Secondary | ICD-10-CM | POA: Diagnosis not present

## 2023-09-28 DIAGNOSIS — H31091 Other chorioretinal scars, right eye: Secondary | ICD-10-CM | POA: Diagnosis not present

## 2023-10-18 ENCOUNTER — Other Ambulatory Visit: Payer: Self-pay | Admitting: Family Medicine

## 2023-10-18 DIAGNOSIS — E7800 Pure hypercholesterolemia, unspecified: Secondary | ICD-10-CM

## 2023-10-18 DIAGNOSIS — Z8673 Personal history of transient ischemic attack (TIA), and cerebral infarction without residual deficits: Secondary | ICD-10-CM

## 2023-10-18 NOTE — Telephone Encounter (Signed)
 [facility] [employer]  Patient is ready for scheduling on or after: 11/10/23  Out-of-pocket cost due at time of visit: $358.71  Primary: [employer] Prolia  co-insurance: 20% (approximately $333.71) Admin fee co-insurance: 20% (approximately $25)  Deductible: N/A  Prior Auth: Key: [id number]    ** This summary of benefits is an estimation of the patient's out-of-pocket cost. Exact cost may vary based on individual plan coverage.

## 2023-10-19 ENCOUNTER — Other Ambulatory Visit: Payer: Self-pay | Admitting: Pharmacy Technician

## 2023-10-19 ENCOUNTER — Other Ambulatory Visit: Payer: Self-pay | Admitting: *Deleted

## 2023-10-19 ENCOUNTER — Other Ambulatory Visit (HOSPITAL_COMMUNITY): Payer: Self-pay

## 2023-10-19 ENCOUNTER — Encounter (HOSPITAL_COMMUNITY): Payer: Self-pay

## 2023-10-19 ENCOUNTER — Other Ambulatory Visit: Payer: Self-pay

## 2023-10-19 ENCOUNTER — Encounter: Payer: Self-pay | Admitting: Family Medicine

## 2023-10-19 ENCOUNTER — Encounter: Payer: Self-pay | Admitting: Hematology

## 2023-10-19 ENCOUNTER — Ambulatory Visit: Admitting: Family Medicine

## 2023-10-19 VITALS — BP 118/55 | HR 77 | Ht 59.0 in | Wt 168.2 lb

## 2023-10-19 DIAGNOSIS — M1712 Unilateral primary osteoarthritis, left knee: Secondary | ICD-10-CM | POA: Diagnosis not present

## 2023-10-19 MED ORDER — DENOSUMAB 60 MG/ML ~~LOC~~ SOSY
60.0000 mg | PREFILLED_SYRINGE | Freq: Once | SUBCUTANEOUS | 0 refills | Status: DC
  Filled 2023-10-19: qty 1, 180d supply, fill #0
  Filled 2023-10-19: qty 1, 1d supply, fill #0

## 2023-10-19 NOTE — Patient Instructions (Addendum)
 Please continue using voltaren  gel for your knee  I have placed a referral to physical therapy, you should receive a call soon to schedule this  Please let us  know if you would like to proceed with an injection

## 2023-10-19 NOTE — Assessment & Plan Note (Signed)
 Patient prefers to continue conservative management rather than receiving a steroid injection today Placed referral to physical therapy as she may benefit from strengthening of her surrounding musculature Small effusion on exam but do not feel this is traumatic in nature, likely inflammatory in the setting of OA No significant knee instability to suggest ligamentous injury Otherwise continue voltaren , aquatic therapy, discussed compression and comfort/support at night

## 2023-10-19 NOTE — Telephone Encounter (Signed)
 Patient is getting Prolia  from [facility] pharmacy for $0.

## 2023-10-19 NOTE — Progress Notes (Signed)
    SUBJECTIVE:   CHIEF COMPLAINT / HPI:   Patient presents for bilateral knee injections for known history of osteoarthritis Previously seen 06/27/2023 and advised to use Voltaren  gel Pt received prolia  injections for osteoporosis She is on Eliquis    Today she reports that she had a flare last week but this has improved a lot today She has been using Voltaren  gel with some improvement Avoiding other NSAIDs because of Eliquis  No recent injury or popping/twisting She would prefer to avoid any injections today but is interested in physical therapy Has recently started doing aquatic exercise in pool which she enjoys  PERTINENT  PMH / PSH: History of DVT, HTN, OA  OBJECTIVE:   BP (!) 118/55   Pulse 77   Ht 4' 11 (1.499 m)   Wt 168 lb 4 oz (76.3 kg)   SpO2 96%   BMI 33.98 kg/m   General: NAD, pleasant, able to participate in exam Respiratory: No respiratory distress Skin: warm and dry, no rashes noted Psych: Normal affect and mood  L knee Inspection: Mild-moderate effusion noted.  Otherwise no gross deformity or ecchymoses Palpation: Mildly tender to palpation over medial joint line, otherwise nontender ROM: F ROM Strength: 5/5 Special tests: Negative Apley's, anterior/posterior drawers, varus/valgus  ASSESSMENT/PLAN:   Assessment & Plan Primary osteoarthritis of left knee Patient prefers to continue conservative management rather than receiving a steroid injection today Placed referral to physical therapy as she may benefit from strengthening of her surrounding musculature Small effusion on exam but do not feel this is traumatic in nature, likely inflammatory in the setting of OA No significant knee instability to suggest ligamentous injury Otherwise continue voltaren , aquatic therapy, discussed compression and comfort/support at night   Payton Coward, MD [facility]

## 2023-10-19 NOTE — Progress Notes (Signed)
 Pharmacy Patient Advocate Encounter  Insurance verification completed.   The patient is insured through [facility]   Ran test claim for Prolia . Co-pay is $0.  This test claim was processed through [facility]- copay amounts may vary at other pharmacies due to pharmacy/plan contracts, or as the patient moves through the different stages of their insurance plan.

## 2023-10-19 NOTE — Progress Notes (Signed)
 Specialty Pharmacy Initial Fill Coordination Note  Heather Andrews is a 83 y.o. female contacted today regarding initial fill of specialty medication(s) Denosumab  (PROLIA )   Patient requested Courier to Provider Office   Delivery date: 11/07/23   [facility]   Medication will be filled on 7/21.   Patient is aware of $0 copayment.

## 2023-10-19 NOTE — Progress Notes (Deleted)
 Heather Andrews is {Pc accompanied by:5710} Sources of clinical information for visit is/are {Information source:60032}. Nursing assessment for this office visit was reviewed with the patient for accuracy and revision.     Previous Report(s) Reviewed: {Outside review:15817}     06/27/2023    1:58 PM  Depression screen PHQ 2/9  Decreased Interest 0  Down, Depressed, Hopeless 0  PHQ - 2 Score 0  Altered sleeping 0  Tired, decreased energy 0  Change in appetite 0  Feeling bad or failure about yourself  0  Trouble concentrating 0  Moving slowly or fidgety/restless 0  Suicidal thoughts 0  PHQ-9 Score 0   Flowsheet Row Office Visit from 06/27/2023 in [facility] [place] Office Visit from 03/09/2023 in [facility] . [facility] Office Visit from 01/12/2023 in [facility]  Thoughts that you would be better off dead, or of hurting yourself in some way Not at all Not at all Not at all  PHQ-9 Total Score 0 3 0       03/09/2023    9:04 AM 01/12/2023    8:31 AM 12/30/2022    3:24 PM 09/08/2022    9:49 AM 06/09/2022    9:39 AM  Fall Risk   Falls in the past year? 1 0 0 0 1  Number falls in past yr: 0 0 0 0 0  Injury with Fall? 1 0 0 0 1  Risk for fall due to :   No Fall Risks    Follow up   Falls prevention discussed;Education provided;Falls evaluation completed         06/27/2023    1:58 PM 03/09/2023    9:04 AM 01/12/2023    8:31 AM  PHQ9 SCORE ONLY  PHQ-9 Total Score 0 3 0    There are no preventive care reminders to display for this patient.  Health Maintenance Due  Topic Date Due   Zoster Vaccines- Shingrix (2 of 2) 11/12/2021   COVID-19 Vaccine (5 - 2024-25 season) 12/18/2022      History/P.E. limitations: {exam; limitations ed:60112}  There are no preventive care reminders to display for this patient. There are no  preventive care reminders to display for this patient.  Health Maintenance Due  Topic Date Due   Zoster Vaccines- Shingrix (2 of 2) 11/12/2021   COVID-19 Vaccine (5 - 2024-25 season) 12/18/2022     No chief complaint on file.    --------------------------------------------------------------------------------------------------------------------------------------------- Visit Problem List with A/P  No problem-specific Assessment & Plan notes found for this encounter.

## 2023-10-24 ENCOUNTER — Other Ambulatory Visit: Payer: Self-pay | Admitting: *Deleted

## 2023-10-24 ENCOUNTER — Encounter: Payer: Self-pay | Admitting: *Deleted

## 2023-10-24 DIAGNOSIS — M810 Age-related osteoporosis without current pathological fracture: Secondary | ICD-10-CM

## 2023-10-30 ENCOUNTER — Other Ambulatory Visit: Payer: Self-pay

## 2023-10-30 DIAGNOSIS — C8294 Follicular lymphoma, unspecified, lymph nodes of axilla and upper limb: Secondary | ICD-10-CM

## 2023-10-31 ENCOUNTER — Inpatient Hospital Stay: Admitting: Hematology

## 2023-10-31 ENCOUNTER — Inpatient Hospital Stay: Attending: Hematology

## 2023-10-31 ENCOUNTER — Inpatient Hospital Stay: Admitting: Physician Assistant

## 2023-10-31 ENCOUNTER — Inpatient Hospital Stay

## 2023-10-31 VITALS — BP 116/47 | HR 68 | Temp 97.3°F | Resp 18 | Wt 172.5 lb

## 2023-10-31 VITALS — BP 132/46 | HR 83 | Temp 98.0°F | Resp 83

## 2023-10-31 DIAGNOSIS — Z7189 Other specified counseling: Secondary | ICD-10-CM

## 2023-10-31 DIAGNOSIS — C8294 Follicular lymphoma, unspecified, lymph nodes of axilla and upper limb: Secondary | ICD-10-CM

## 2023-10-31 DIAGNOSIS — Z5112 Encounter for antineoplastic immunotherapy: Secondary | ICD-10-CM | POA: Diagnosis not present

## 2023-10-31 DIAGNOSIS — Z79899 Other long term (current) drug therapy: Secondary | ICD-10-CM | POA: Insufficient documentation

## 2023-10-31 LAB — CMP (CANCER CENTER ONLY)
ALT: 17 U/L (ref 0–44)
AST: 21 U/L (ref 15–41)
Albumin: 4 g/dL (ref 3.5–5.0)
Alkaline Phosphatase: 44 U/L (ref 38–126)
Anion gap: 7 (ref 5–15)
BUN: 22 mg/dL (ref 8–23)
CO2: 29 mmol/L (ref 22–32)
Calcium: 9.5 mg/dL (ref 8.9–10.3)
Chloride: 105 mmol/L (ref 98–111)
Creatinine: 0.74 mg/dL (ref 0.44–1.00)
GFR, Estimated: >60 mL/min (ref >60)
Glucose, Bld: 115 mg/dL — ABNORMAL HIGH (ref 70–99)
Potassium: 3.8 mmol/L (ref 3.5–5.1)
Sodium: 141 mmol/L (ref 135–145)
Total Bilirubin: 0.4 mg/dL (ref 0.0–1.2)
Total Protein: 6.6 g/dL (ref 6.5–8.1)

## 2023-10-31 LAB — CBC WITH DIFFERENTIAL (CANCER CENTER ONLY)
Abs Immature Granulocytes: 0.06 K/uL (ref 0.00–0.07)
Basophils Absolute: 0.1 K/uL (ref 0.0–0.1)
Basophils Relative: 1 %
Eosinophils Absolute: 0.2 K/uL (ref 0.0–0.5)
Eosinophils Relative: 2 %
HCT: 34.9 % — ABNORMAL LOW (ref 36.0–46.0)
Hemoglobin: 11.8 g/dL — ABNORMAL LOW (ref 12.0–15.0)
Immature Granulocytes: 1 %
Lymphocytes Relative: 9 %
Lymphs Abs: 0.6 K/uL — ABNORMAL LOW (ref 0.7–4.0)
MCH: 31.7 pg (ref 26.0–34.0)
MCHC: 33.8 g/dL (ref 30.0–36.0)
MCV: 93.8 fL (ref 80.0–100.0)
Monocytes Absolute: 0.9 K/uL (ref 0.1–1.0)
Monocytes Relative: 12 %
Neutro Abs: 5.4 K/uL (ref 1.7–7.7)
Neutrophils Relative %: 75 %
Platelet Count: 226 K/uL (ref 150–400)
RBC: 3.72 MIL/uL — ABNORMAL LOW (ref 3.87–5.11)
RDW: 14.1 % (ref 11.5–15.5)
WBC Count: 7.2 K/uL (ref 4.0–10.5)
nRBC: 0 % (ref 0.0–0.2)

## 2023-10-31 LAB — LACTATE DEHYDROGENASE: LDH: 176 U/L (ref 98–192)

## 2023-10-31 MED ORDER — FAMOTIDINE IN NACL 20-0.9 MG/50ML-% IV SOLN
20.0000 mg | Freq: Once | INTRAVENOUS | Status: AC
  Administered 2023-10-31: 20 mg via INTRAVENOUS
  Filled 2023-10-31: qty 50

## 2023-10-31 MED ORDER — SODIUM CHLORIDE 0.9 % IV SOLN
375.0000 mg/m2 | Freq: Once | INTRAVENOUS | Status: DC
  Filled 2023-10-31: qty 70

## 2023-10-31 MED ORDER — ACETAMINOPHEN 500 MG PO TABS
1000.0000 mg | ORAL_TABLET | Freq: Once | ORAL | Status: AC
  Administered 2023-10-31: 1000 mg via ORAL
  Filled 2023-10-31: qty 2

## 2023-10-31 MED ORDER — METHYLPREDNISOLONE SODIUM SUCC 125 MG IJ SOLR
125.0000 mg | Freq: Once | INTRAMUSCULAR | Status: AC
  Administered 2023-10-31: 125 mg via INTRAVENOUS
  Filled 2023-10-31: qty 2

## 2023-10-31 MED ORDER — MONTELUKAST SODIUM 10 MG PO TABS
10.0000 mg | ORAL_TABLET | Freq: Once | ORAL | Status: AC
  Administered 2023-10-31: 10 mg via ORAL
  Filled 2023-10-31: qty 1

## 2023-10-31 MED ORDER — CETIRIZINE HCL 10 MG/ML IV SOLN
10.0000 mg | Freq: Once | INTRAVENOUS | Status: AC
  Administered 2023-10-31: 10 mg via INTRAVENOUS
  Filled 2023-10-31: qty 1

## 2023-10-31 MED ORDER — SODIUM CHLORIDE 0.9 % IV SOLN
Freq: Once | INTRAVENOUS | Status: AC
Start: 2023-10-31 — End: 2023-10-31

## 2023-10-31 NOTE — Progress Notes (Signed)
 HEMATOLOGY/ONCOLOGY CLINIC NOTE  Date of Service: 10/31/23  Patient Care Team: McDiarmid, Krystal BIRCH, MD as PCP - General (Family Medicine) [name], MD as Consulting Physician (Hematology)  CHIEF COMPLAINTS/PURPOSE OF CONSULTATION:  Evaluation and management of newly-diagnosed follicular lymphoma.  HISTORY OF PRESENTING ILLNESS:   Heather Andrews is a wonderful 83 y.o. female who has been referred to us  by Krystal Bale, MD for evaluation and management of newly-diagnosed low-grade follicular lymphoma.   Today, she is accompanied by her daughter. She reports that she has had right LE edema for about 6 months. Upon examination, patient has bilateral LE edema, right more than left.   She also reports having a rash at one point. Patient denies any medication changes, long distance travel, or unusual activity. Patient denies any redness/pain in the area.  Patient denies back pain, recent infections, abdominal pain, change in bowel habits, fever, chills, or night sweats.  She does report that she tripped and suffered a fall in January 2024, causing her to hit the side of her head.  Patient denies any breast discomfort or swelling besides in her LE. She does however sometimes experience tingling in her hands.  Patient does confirm having a silent stroke at some point previously, but is unsure of when it occurred.   Patient does report that she is slowing down in general, which she attributes to her age. Patient denies any sudden new fatigue or other major limiting medical issues at this time. Patient does regualrly take vitamin D , but is unsure of how many units she takes. She also takes vitamin K. Patient denies any concern for varicose veins in the past. Patient reports that she will be traveling to [facility] for a week and will leave May 4th.  INTERVAL HISTORY:  Heather Andrews is a 83 y.o. female here for continued evaluation and management of newly-diagnosed  follicular lymphoma.  Patient was last seen by me on 08/29/2023 and reported itchiness throughout her body, intermittent occasional leg swelling, and improved chronic fatigue.  Patient presents in a wheelchair and is accompanied by her daughter. She complains of constant fatigue. Her fatigue does not limit her ability to complete her daily activities. Her fatigue is not attributed to frequent sun exposure. She notes that resting does improve fatigue after a while. Patient notes that she does tend to nap in the afternoons.   She has tolerated last treatment well with no major toxicities.   Patient reports drenching night sweats once in while, lasting 2-3 nights at a time. She denies feeling warm or cold with her sweats. Patient denies any fever, chills, or infection issues.   She reports that constantly awakens in the middle of night and has issues returning to sleep.   Patient denies any change in breathing habits, new abdominal pain, change in bowel habits, new urinary symptoms, lack of appetite, or sudden unexpected weight loss.   She reports that she continues to have an enlarged lymph node in the groin which has decreased in size.   Patient reports noticing what she believed to be a new left-sided cervical lump, however this was noted to be her muscle based on physical examination.   She enjoys swimming 2-5x a week.   MEDICAL HISTORY: . 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 Fontaine, OBGYN).   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   Sensorineural hearing loss (SNHL) of both ears    Patient wears biaural hearing aids   Stress incontinence, female    USI    SURGICAL HISTORY: Past Surgical History:  Procedure Laterality Date   BASAL CELL CARCINOMA EXCISION     CATARACT EXTRACTION     Cyst removed from chest     EYE SURGERY     TO CORRECT LAZY EYE    SOCIAL HISTORY: Social History   Socioeconomic History   Marital status: Widowed    Spouse name: Not on file   Number of children: 3   Years of education: 73   Highest education level: Master's degree (e.g., MA, MS, MEng, MEd, MSW, MBA)  Occupational History   Occupation: Tourist information centre manager    Comment: Retired  Tobacco Use   Smoking status: Former    Current packs/day: 0.50    Average packs/day: 0.5 packs/day for 67.5 years (33.8 ttl pk-yrs)    Types: Cigarettes    Start date: 04/1956   Smokeless tobacco: Never  Vaping Use   Vaping status: Never Used  Substance and Sexual Activity   Alcohol use: Yes    Comment: occassionally   Drug use: No   Sexual activity: Never    Comment: 1st intercourse 83 yo-Fewer than 5 partners  Other Topics Concern   Not on file  Social History Narrative   Widowed, lives alone though a daughter lives next door.   3 children: [name]; [name], [name]   Activities: reading, interacting with her two cats, playing cards, corresponding with others (email, snail mail), needlework, cooking   Patient indicated on Geri Assessment questionnaire that she would want her daughter, [name] to make medical decisions on her behalf should she be unable. [phone]).  The patient gives permission to speak to Ms Romnet about issue around the patient's health.      Patient lives in a townhouse.  13 steps to get into home.  Home has two levels.    Assists Devices in home: Grab Bars    Regular exercise: Yoga once a week   Transportation: Patient drives   Social Drivers of Health   Financial Resource Strain: Low Risk  (01/11/2023)   Overall Financial Resource Strain (CARDIA)    Difficulty of Paying Living Expenses: Not hard at all  Food Insecurity: No Food Insecurity (01/11/2023)   Hunger Vital Sign    Worried About Running Out of Food in the Last Year: Never true    Ran Out of Food in the Last Year: Never true  Transportation Needs: No Transportation Needs (01/11/2023)   PRAPARE - Administrator, Civil Service (Medical): No    Lack of Transportation (Non-Medical): No  Physical Activity: Insufficiently Active (01/11/2023)   Exercise Vital Sign    Days of Exercise per Week: 4 days    Minutes of Exercise per Session: 30 min  Stress: No Stress Concern Present (01/11/2023)   [employer] of Occupational  Health - Occupational Stress Questionnaire    Feeling of Stress : Not at all  Social Connections: Moderately Integrated (01/11/2023)   Social Connection and Isolation Panel    Frequency of Communication with Friends and Family: More than three times a week    Frequency of Social Gatherings with Friends and Family: Twice a week    Attends Religious Services: More than 4 times per year    Active Member of [employer] or Organizations: Yes    Attends Banker Meetings: More than 4 times per year    Marital Status: Widowed  Intimate Partner Violence: Not At Risk (12/30/2022)   Humiliation, Afraid, Rape, and Kick questionnaire    Fear of Current or Ex-Partner: No    Emotionally Abused: No    Physically Abused: No    Sexually Abused: No    FAMILY HISTORY: Family History  Problem Relation Age of Onset   Hypertension Mother    Heart disease Paternal Grandmother    Heart disease Paternal Grandfather    Cancer Brother        Leukemia    ALLERGIES:  is allergic to lisinopril, ephedrine-guaifenesin, and rituximab .  MEDICATIONS:  Current Outpatient  Medications  Medication Sig Dispense Refill   albuterol  (VENTOLIN  HFA) 108 (90 Base) MCG/ACT inhaler Inhale 2 puffs into the lungs every 6 (six) hours as needed for wheezing or shortness of breath. 18 g 5   calcium  carbonate (OSCAL) 1500 (600 Ca) MG TABS tablet Take 600 mg by mouth 2 (two) times daily.     Cholecalciferol (VITAMIN D3) 125 MCG (5000 UT) CAPS Take 125 mcg by mouth daily.     Cyanocobalamin (VITAMIN B-12 PO) Take 1 tablet by mouth daily.     denosumab  (PROLIA ) 60 MG/ML SOSY injection Inject 60 mg into the skin every 6 (six) months.     diclofenac  Sodium (VOLTAREN ) 1 % GEL Apply 2 g topically 4 (four) times daily. 50 g 2   ELIQUIS  5 MG TABS tablet TAKE 1 TABLET BY MOUTH TWICE A DAY 60 tablet 4   FLUAD 0.5 ML injection      GEMTESA 75 MG TABS Take 1 tablet by mouth daily.     hydrochlorothiazide  (HYDRODIURIL ) 25 MG tablet TAKE 1 TABLET (25 MG TOTAL) BY MOUTH DAILY. 90 tablet 3   Menaquinone-7 (VITAMIN K2) 100 MCG CAPS as directed Orally     rosuvastatin  (CRESTOR ) 20 MG tablet TAKE 1 TABLET BY MOUTH EVERY DAY 90 tablet 3   SPIKEVAX syringe      Tiotropium Bromide-Olodaterol (STIOLTO RESPIMAT ) 2.5-2.5 MCG/ACT AERS INHALE 2 PUFFS BY MOUTH INTO THE LUNGS DAILY 4 g 11   No current facility-administered medications for this visit.    REVIEW OF SYSTEMS:    10 Point review of Systems was done is negative except as noted above.   PHYSICAL EXAMINATION: ECOG PERFORMANCE STATUS: 1 - Symptomatic but completely ambulatory  . Vitals:   10/31/23 0940  BP: (!) 116/47  Pulse: 68  Resp: 18  Temp: (!) 97.3 F (36.3 C)  SpO2: 91%   Filed Weights   10/31/23 0940  Weight: 172 lb 8 oz (78.2 kg)   .Body mass index is 34.84 kg/m. GENERAL:alert, in no acute distress and comfortable SKIN: no acute rashes, no significant lesions EYES: conjunctiva are pink and non-injected, sclera anicteric OROPHARYNX: MMM, no exudates, no oropharyngeal erythema or ulceration NECK: supple, no JVD LYMPH:   no palpable lymphadenopathy in the cervical, axillary or inguinal regions LUNGS: clear to auscultation  b/l with normal respiratory effort HEART: regular rate & rhythm ABDOMEN:  normoactive bowel sounds , non tender, not distended. Extremity: no pedal edema PSYCH: alert & oriented x 3 with fluent speech NEURO: no focal motor/sensory deficits   LABORATORY DATA:  I have reviewed the data as listed .    Latest Ref Rng & Units 10/31/2023    9:24 AM 08/29/2023   11:11 AM 07/05/2023    8:42 AM  CBC  WBC 4.0 - 10.5 K/uL 7.2  7.9  6.3   Hemoglobin 12.0 - 15.0 g/dL 88.1  87.9  88.0   Hematocrit 36.0 - 46.0 % 34.9  35.5  35.5   Platelets 150 - 400 K/uL 226  227  232    .    Latest Ref Rng & Units 11/01/2023    9:33 AM 10/31/2023    9:24 AM 08/29/2023   11:11 AM  CMP  Glucose 65 - 139 mg/dL 863  884  885   BUN 7 - 25 mg/dL 23  22  24    Creatinine 0.60 - 0.95 mg/dL 9.32  9.25  9.13   Sodium 135 - 146 mmol/L 141  141  139   Potassium 3.5 - 5.3 mmol/L 4.1  3.8  3.9   Chloride 98 - 110 mmol/L 102  105  101   CO2 20 - 32 mmol/L 28  29  31    Calcium  8.6 - 10.4 mg/dL 9.5  9.5  9.7   Total Protein 6.5 - 8.1 g/dL  6.6  7.1   Total Bilirubin 0.0 - 1.2 mg/dL  0.4  0.4   Alkaline Phos 38 - 126 U/L  44  42   AST 15 - 41 U/L  21  23   ALT 0 - 44 U/L  17  18    . Lab Results  Component Value Date   LDH 176 10/31/2023   Component     Latest Ref Rng 08/04/2022  Vitamin D , 25-Hydroxy     30 - 100 ng/mL 49.94   HIV Screen 4th Generation wRfx     Non Reactive  Non Reactive   Hepatitis B Surface Ag     NON REACTIVE  NON REACTIVE   Hep B Core Total Ab     NON REACTIVE  NON REACTIVE   HCV Ab     NON REACTIVE  NON REACTIVE      Needle core biopsy 07/27/2022:   Breast Ultrasound 07/20/2022:  Ultrasound guided biopsy:  Ultrasound guided biopsy:       RADIOGRAPHIC STUDIES: I have personally reviewed the radiological images as listed and agreed with the findings in the report. No  results found.  ASSESSMENT & PLAN:   Wonderful 83 y.o. female with:  Stage IV low grade follicular lymphoma incidentally noted LNadenopathy on routine mammogram. History of asymptomatic stroke  Hypertension  Pure hypercholesterolemia  Contusion of right shoulder region  6.  Hx of Acute DVT of left peroneal veins  PLAN:   -Discussed lab results on 10/31/23 in detail with patient. CBC showed WBC of 7.2K, hemoglobin of 11.8, and platelets of 226K. -CMP stable -Did not feel any significantly enlarged lymph nodes based on physical examination. I did palpate a few small lymph nodes, which have not significantly increased in size. Her lymph node in the groin appears to have slightly decreased in size based on physical exam.  -discussed that it is unlikely that her fatigue is related to lymphoma. Educated patient that fatigue that is related  to lymphoma is generally pervasive and not related to exertion.  -Educated patient that fatigue can be related to eating, sleeping, hydration status, and activity level -discussed general possibility that treatment can cause fatigue -there is no clinical sign or lab evidence suggestive of follicular lymphoma progression at this time -Patient notes no significant notable toxicities from her previous dose of Rituxan .  -Will continue maintenance Rituxan  every 60 days with same supportive medications.  -will plan to repeat CT chest abdomen pelvis scan in 1 year around Spring 2026 unless there are any new concerns beforehand.  -patient inquires about whether she can use her neighborhood pool after her Rituxan  infusion. We discussed that it would be okay for her to go to the pool after her infusion treatment if she chooses to from our standpoint. We discussed that her eye issues is a local risk from an infection standpoint.  -answered all of patient's questions in detail  FOLLOW-UP: RTC for next dose of maintenance Rituxan  in 60 days with labs and MD visit   The  total time spent in the appointment was 30 minutes* .  All of the patient's questions were answered with apparent satisfaction. The patient knows to call the clinic with any problems, questions or concerns.   [name] Saran MD MS AAHIVMS [facility] Hematology/Oncology Physician [facility]  .*Total Encounter Time as defined by the Centers for Medicare and Medicaid Services includes, in addition to the face-to-face time of a patient visit (documented in the note above) non-face-to-face time: obtaining and reviewing outside history, ordering and reviewing medications, tests or procedures, care coordination (communications with other health care professionals or caregivers) and documentation in the medical record.    I,Mitra Faeizi,acting as a Neurosurgeon for [name] Saran, MD.,have documented all relevant documentation on the behalf of [name] Saran, MD,as directed by  [name] Saran, MD while in the presence of [name] Saran, MD.  .I have reviewed the above documentation for accuracy and completeness, and I agree with the above. .Tareek Sabo Kishore Joash Tony MD

## 2023-11-01 ENCOUNTER — Other Ambulatory Visit: Payer: Self-pay | Admitting: Family Medicine

## 2023-11-01 DIAGNOSIS — M810 Age-related osteoporosis without current pathological fracture: Secondary | ICD-10-CM | POA: Diagnosis not present

## 2023-11-01 LAB — BASIC METABOLIC PANEL WITH GFR
BUN: 23 mg/dL (ref 7–25)
CO2: 28 mmol/L (ref 20–32)
Calcium: 9.5 mg/dL (ref 8.6–10.4)
Chloride: 102 mmol/L (ref 98–110)
Creat: 0.67 mg/dL (ref 0.60–0.95)
Glucose, Bld: 136 mg/dL (ref 65–139)
Potassium: 4.1 mmol/L (ref 3.5–5.3)
Sodium: 141 mmol/L (ref 135–146)
eGFR: 87 mL/min/1.73m2 (ref >=60)

## 2023-11-01 LAB — VITAMIN D 25 HYDROXY (VIT D DEFICIENCY, FRACTURES): Vit D, 25-Hydroxy: 48 ng/mL (ref 30–100)

## 2023-11-02 ENCOUNTER — Ambulatory Visit: Payer: Self-pay | Admitting: Family Medicine

## 2023-11-06 ENCOUNTER — Encounter: Payer: Self-pay | Admitting: Hematology

## 2023-11-13 ENCOUNTER — Ambulatory Visit (INDEPENDENT_AMBULATORY_CARE_PROVIDER_SITE_OTHER): Admitting: Family Medicine

## 2023-11-13 DIAGNOSIS — M810 Age-related osteoporosis without current pathological fracture: Secondary | ICD-10-CM

## 2023-11-13 MED ORDER — DENOSUMAB 60 MG/ML ~~LOC~~ SOSY
60.0000 mg | PREFILLED_SYRINGE | Freq: Once | SUBCUTANEOUS | Status: AC
  Administered 2023-11-13: 60 mg via SUBCUTANEOUS

## 2023-11-13 NOTE — Progress Notes (Signed)
 Patient given [place] prolia injection 60mg /ml in the right arm. Patient tolerated injection well without reaction at the injection site. Patient will schedule next injection, which is 6 months from today.

## 2023-11-14 ENCOUNTER — Other Ambulatory Visit: Payer: Self-pay

## 2023-11-14 NOTE — Therapy (Signed)
 OUTPATIENT PHYSICAL THERAPY LOWER EXTREMITY EVALUATION   Patient Name: Heather Andrews MRN: [id number] DOB:Dec 09, 1940, 83 y.o., female Today's Date: 11/15/2023  END OF SESSION:  PT End of Session - 11/15/23 1022     Visit Number 1    Number of Visits 17    Date for PT Re-Evaluation 01/10/24    Authorization Type [employer]    PT Start Time 0930    PT Stop Time 1010    PT Time Calculation (min) 40 min    Activity Tolerance Patient tolerated treatment well    Behavior During Therapy WFL for tasks assessed/performed          Past Medical History:  Diagnosis Date   Actinic keratosis 04/26/2021   Acute deep vein thrombosis (DVT) of distal end of left lower extremity (HCC) 09/08/2022   Cataracts, bilateral    Elevated cholesterol    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 Fontaine, OBGYN).   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) 09/08/2022   Counseling regarding advance care planning and goals of care 09/08/2022   History of asymptomatic stroke 04/29/2022   Stress incontinence, female 04/04/2022   Osteoporosis 04/04/2022   Actinic keratosis 04/04/2022   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   Overactive bladder 04/26/2021   Hearing impairment 04/26/2021   Allergic rhinitis 04/26/2021   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 03/19/2021   Prediabetes 03/18/2021   Vitamin D  deficiency 03/18/2021   Gastroesophageal reflux 03/18/2021   Primary osteoarthritis of left knee 07/03/2017   Essential hypertension    Pure hypercholesterolemia     PCP: McDiarmid, Krystal BIRCH, MD  REFERRING PROVIDER: Donah Laymon PARAS, MD  REFERRING DIAG: DX M17.12 (ICD-10-CM) - Primary osteoarthritis of left knee  THERAPY DIAG:  Other abnormalities of gait and mobility - Plan: PT plan of care cert/re-cert  Chronic pain of left knee - Plan: PT plan of care cert/re-cert  Muscle weakness (generalized) - Plan: PT plan of care cert/re-cert  Edema, unspecified type - Plan: PT plan of care cert/re-cert  Rationale for Evaluation and Treatment: Rehabilitation  ONSET DATE: 3 months ago  SUBJECTIVE:   SUBJECTIVE STATEMENT: Pt has lymphoma and is getting infusions every other month.  She is stable as of now. Left knee pain and swelling started about 3 months ago and is affecting walking and transfers.  PERTINENT HISTORY: Lymphoma PAIN:  Are you having pain? Yes: NPRS scale: 3/10-7/10   Pain location: L knee ant Pain description: achy, dull Aggravating factors: sit to stand  after prolonged sitting, bending, pool Relieving factors: tylenol   PRECAUTIONS: None  RED FLAGS: None   WEIGHT BEARING RESTRICTIONS: No  FALLS:  Has patient fallen in last 6 months? No  LIVING ENVIRONMENT: Lives with: lives alone Lives in: House/apartment Stairs: Yes: Internal: 12 steps; can reach both Has following equipment at home: None  OCCUPATION: retired  PLOF: Independent  PATIENT GOALS: Decrease pain with activity  NEXT MD VISIT: unknown  OBJECTIVE:  Note: Objective measures were completed at Evaluation unless otherwise noted.  DIAGNOSTIC FINDINGS:  No  recent x rays of the knee  PATIENT SURVEYS:  PSFS: THE PATIENT SPECIFIC FUNCTIONAL SCALE  Place score of 0-10 (0 = unable to perform activity and 10 = able to perform activity at the same level as before injury or problem)  Activity Date: 11/15/23    Walking 3    2.stairs 3    3.out of car 3    4.      Total Score 3      Total Score = Sum of activity scores/number of activities  Minimally Detectable Change: 3 points (for single activity); 2 points (for average score)  [facility] (nd). The Patient Specific Functional Scale . Retrieved from [email or web address]   COGNITION: Overall cognitive status: Within functional limits for tasks assessed     SENSATION: WFL  EDEMA:    MUSCLE LENGTH: Hamstrings:  Left moderate restriction   POSTURE:  No Significant postural limitations PALPATION: 2+ tenderness at medial joint line  LOWER EXTREMITY ROM:  AROM/PROM Right eval Left eval  Hip flexion    Hip extension    Hip abduction    Hip adduction    Hip internal rotation    Hip external rotation    Knee flexion  90/102  Knee extension  +5/+3  Ankle dorsiflexion    Ankle plantarflexion    Ankle inversion    Ankle eversion     (Blank rows = not tested)  LOWER EXTREMITY MMT:  MMT Right eval Left eval  Hip flexion  4+  Hip extension    Hip abduction  4  Hip adduction    Hip internal rotation    Hip external rotation    Knee flexion  5-  Knee extension  4  Ankle dorsiflexion    Ankle plantarflexion    Ankle inversion    Ankle eversion     (Blank rows = not tested)  LOWER EXTREMITY SPECIAL TESTS:  Bounce neg  FUNCTIONAL TESTS:  5 times sit to stand: 15 sec  GAIT: Distance walked: short community distances  Assistive device utilized: None Level of assistance: Complete Independence                                                                                                                                  TREATMENT DATE:  11/15/23 Evaluation completed, HEP trial sets and pool exercises review   PATIENT EDUCATION:  Education details:  HEP instruction and handout Person educated: Patient Education method: Explanation, Demonstration, Tactile cues, Verbal cues, and Handouts Education comprehension: verbalized understanding, returned demonstration, and verbal cues required  HOME EXERCISE PROGRAM: Access Code: [email or web address] URL: [email or web address] Date: 11/15/2023 Prepared by: Burnard Meth  Exercises - Supine Quad Set  - 2 x daily - 7 x weekly - 2 sets - 10 reps - 2 hold - Supine Active Straight Leg Raise  - 2 x daily - 7 x weekly - 2 sets - 10 reps - Supine Hamstring Stretch with Strap  - 2 x daily - 7 x weekly - 1 sets - 2 reps  ASSESSMENT:  CLINICAL IMPRESSION: Patient is a 83 y.o. female who was seen today for physical therapy evaluation and treatment for left knee pain due to OA.  She presents with decreased ROM, decreased strength, altered gait and pain.  She would benefit from skilled PT to address these deficits to return to previous LOA in a pain free manner.  OBJECTIVE IMPAIRMENTS: Abnormal gait, decreased balance, decreased endurance, difficulty walking, decreased ROM, decreased strength, hypomobility, impaired flexibility, and pain.   ACTIVITY LIMITATIONS: bending, standing, squatting, sleeping, stairs, and transfers  PARTICIPATION LIMITATIONS: driving, shopping, and community activity  PERSONAL FACTORS: 1-2 comorbidities:  also affecting patient's functional outcome.   REHAB POTENTIAL: Good  CLINICAL DECISION MAKING: Stable/uncomplicated  EVALUATION COMPLEXITY: Moderate   GOALS: Goals reviewed with patient? Yes  SHORT TERM GOALS: Target date: 12/13/2023    Pt to be independent with HEP. Baseline: Goal status: INITIAL  2.  Decrease pain with activity be 1 level. Baseline:  Goal status: INITIAL  LONG TERM GOALS: Target date:  01/10/2024    Increase strength by 1/2 grade or more in affected LLE. Baseline: 4 Goal status: INITIAL  2.  Improve PSFS score by 2 points Baseline: 3 Goal status: INITIAL  3.  Decrease max pain with activity to 2/10 or less Baseline:  Goal status: INITIAL  4.  Able to ambulate short community distances with 2/10 or less pain. Baseline: pain>2/10 Goal status: INITIAL  5.  Decrease sit to stand time by 2 seconds. Baseline: 15 Goal status: INITIAL  6.  Increase PSFS by 2-3  to return to previous LOA. Baseline: 3 Goal status: INITIAL  7. Increase AROM to 0>100.  Baseline: 5>90  Goal status:INITIAL   PLAN:  PT FREQUENCY: 1-2x/week  PT DURATION: 8 weeks  PLANNED INTERVENTIONS: 97164- PT Re-evaluation, 97110-Therapeutic exercises, 97530- Therapeutic activity, [id number]- Neuromuscular re-education, 97535- Self Care, [id number]- Manual therapy, [phone]- Gait training, [phone]- Aquatic Therapy, [phone]- Vasopneumatic device, Patient/Family education, Balance training, Stair training, Joint mobilization, Cryotherapy, and Moist heat  PLAN FOR NEXT SESSION: Start on rec bike or Nustep.   Burnard CHRISTELLA Meth, PT 11/15/2023, 11:39 AM   Referring diagnosis?  DX M17.12 (ICD-10-CM) - Primary osteoarthritis of left knee Treatment diagnosis? (if different than referring diagnosis) R26.89 M25.562 G89.29 M62.81 R60.9 What was this (referring dx) caused by? []  Surgery []  Fall []  Ongoing issue [x]  Arthritis []  Other: ____________  Laterality: []  Rt [x]  Lt []  Both  Check all possible CPT codes:  *CHOOSE 10 OR LESS*    See Planned Interventions listed in the Plan section of the Evaluation.

## 2023-11-15 ENCOUNTER — Ambulatory Visit

## 2023-11-15 ENCOUNTER — Other Ambulatory Visit: Payer: Self-pay

## 2023-11-15 DIAGNOSIS — M25562 Pain in left knee: Secondary | ICD-10-CM

## 2023-11-15 DIAGNOSIS — R609 Edema, unspecified: Secondary | ICD-10-CM

## 2023-11-15 DIAGNOSIS — G8929 Other chronic pain: Secondary | ICD-10-CM | POA: Diagnosis not present

## 2023-11-15 DIAGNOSIS — R2689 Other abnormalities of gait and mobility: Secondary | ICD-10-CM | POA: Diagnosis not present

## 2023-11-15 DIAGNOSIS — M6281 Muscle weakness (generalized): Secondary | ICD-10-CM | POA: Diagnosis not present

## 2023-11-17 DIAGNOSIS — H353211 Exudative age-related macular degeneration, right eye, with active choroidal neovascularization: Secondary | ICD-10-CM | POA: Diagnosis not present

## 2023-11-17 DIAGNOSIS — H31091 Other chorioretinal scars, right eye: Secondary | ICD-10-CM | POA: Diagnosis not present

## 2023-11-17 DIAGNOSIS — H53032 Strabismic amblyopia, left eye: Secondary | ICD-10-CM | POA: Diagnosis not present

## 2023-11-17 DIAGNOSIS — H43813 Vitreous degeneration, bilateral: Secondary | ICD-10-CM | POA: Diagnosis not present

## 2023-11-17 DIAGNOSIS — H353122 Nonexudative age-related macular degeneration, left eye, intermediate dry stage: Secondary | ICD-10-CM | POA: Diagnosis not present

## 2023-11-17 DIAGNOSIS — H35371 Puckering of macula, right eye: Secondary | ICD-10-CM | POA: Diagnosis not present

## 2023-11-17 DIAGNOSIS — Z961 Presence of intraocular lens: Secondary | ICD-10-CM | POA: Diagnosis not present

## 2023-12-01 ENCOUNTER — Ambulatory Visit

## 2023-12-01 DIAGNOSIS — G8929 Other chronic pain: Secondary | ICD-10-CM | POA: Diagnosis not present

## 2023-12-01 DIAGNOSIS — R2689 Other abnormalities of gait and mobility: Secondary | ICD-10-CM | POA: Diagnosis not present

## 2023-12-01 DIAGNOSIS — M6281 Muscle weakness (generalized): Secondary | ICD-10-CM | POA: Diagnosis not present

## 2023-12-01 DIAGNOSIS — R2681 Unsteadiness on feet: Secondary | ICD-10-CM | POA: Diagnosis not present

## 2023-12-01 DIAGNOSIS — M25562 Pain in left knee: Secondary | ICD-10-CM

## 2023-12-01 NOTE — Therapy (Signed)
 OUTPATIENT PHYSICAL THERAPY LOWER EXTREMITY TREATMENT   Patient Name: Heather Andrews MRN: [id number] DOB:[birth year], 83 y.o., female Today's Date: 12/01/2023  END OF SESSION:  PT End of Session - 12/01/23 0842     Visit Number 2    Number of Visits 17    Date for PT Re-Evaluation 01/10/24    Authorization Type [employer]    PT Start Time 0800    PT Stop Time 0838    PT Time Calculation (min) 38 min    Activity Tolerance Patient tolerated treatment well    Behavior During Therapy [facility] for tasks assessed/performed           Past Medical History:  Diagnosis Date   Actinic keratosis 04/26/2021   Acute deep vein thrombosis (DVT) of distal end of left lower extremity (HCC) 09/08/2022   Cataracts, bilateral    Elevated cholesterol    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 Fontaine, OBGYN).   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) 09/08/2022   Counseling regarding advance care planning and goals of care 09/08/2022   History of asymptomatic stroke 04/29/2022   Stress incontinence, female 04/04/2022   Osteoporosis 04/04/2022   Actinic keratosis 04/04/2022   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   Overactive bladder 04/26/2021   Hearing impairment 04/26/2021   Allergic rhinitis 04/26/2021   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 03/19/2021   Prediabetes 03/18/2021   Vitamin D  deficiency 03/18/2021   Gastroesophageal reflux 03/18/2021   Primary osteoarthritis of left knee 07/03/2017   Essential hypertension    Pure hypercholesterolemia     PCP: McDiarmid, Krystal BIRCH, MD  REFERRING PROVIDER: Donah Laymon PARAS, MD  REFERRING DIAG: DX M17.12 (ICD-10-CM) - Primary osteoarthritis of left knee  THERAPY DIAG:  Other abnormalities of gait and mobility  Chronic pain of left knee  Muscle weakness (generalized)  Unsteadiness on feet  Rationale for Evaluation and Treatment: Rehabilitation  ONSET DATE: 3 months ago  SUBJECTIVE:   SUBJECTIVE STATEMENT: Pt reports left knee is about the same. Feels like with compensating, it bothers her R hip. PERTINENT HISTORY: Lymphoma PAIN:  Are you having pain? Yes: NPRS scale: 0/10 today Pain location: L knee ant Pain description: achy, dull Aggravating factors: sit to stand after prolonged sitting, bending, pool Relieving factors: tylenol   PRECAUTIONS: None  RED FLAGS: None   WEIGHT BEARING RESTRICTIONS: No  FALLS:  Has patient fallen in last 6 months? No  LIVING ENVIRONMENT: Lives with: lives alone Lives in: House/apartment Stairs: Yes: Internal:  12 steps; can reach both Has following equipment at home: None  OCCUPATION: retired  PLOF: Independent  PATIENT GOALS: Decrease pain with activity  NEXT MD VISIT: unknown  OBJECTIVE:  Note: Objective measures were completed at Evaluation unless otherwise noted.  DIAGNOSTIC FINDINGS:  No recent x rays of the knee  PATIENT SURVEYS:  PSFS: THE PATIENT SPECIFIC FUNCTIONAL SCALE  Place score of 0-10 (0 = unable to perform activity and 10 = able to perform activity at the same level as before injury or problem)  Activity Date:  11/15/23    Walking 3    2.stairs 3    3.out of car 3    4.      Total Score 3      Total Score = Sum of activity scores/number of activities  Minimally Detectable Change: 3 points (for single activity); 2 points (for average score)  [facility] (nd). The Patient Specific Functional Scale . Retrieved from [email or web address]   COGNITION: Overall cognitive status: Within functional limits for tasks assessed     SENSATION: WFL  EDEMA:    MUSCLE LENGTH: Hamstrings:  Left moderate restriction   POSTURE:  No Significant postural limitations PALPATION: 2+ tenderness at medial joint line  LOWER EXTREMITY ROM:  AROM/PROM Right eval Left eval  Hip flexion    Hip extension    Hip abduction    Hip adduction    Hip internal rotation    Hip external rotation    Knee flexion  90/102  Knee extension  +5/+3  Ankle dorsiflexion    Ankle plantarflexion    Ankle inversion    Ankle eversion     (Blank rows = not tested)  LOWER EXTREMITY MMT:  MMT Right eval Left eval  Hip flexion  4+  Hip extension    Hip abduction  4  Hip adduction    Hip internal rotation    Hip external rotation    Knee flexion  5-  Knee extension  4  Ankle dorsiflexion    Ankle plantarflexion    Ankle inversion    Ankle eversion     (Blank rows = not tested)  LOWER EXTREMITY SPECIAL TESTS:  Bounce neg  FUNCTIONAL TESTS:  5 times sit to stand: 15 sec  GAIT: Distance walked: short community distances  Assistive device utilized: None Level of assistance: Complete Independence                                                                                                                                 TREATMENT DATE:  12/01/23 Nu step level 2 8 min Seated marching 2x20 Seated toe raises 25x Quad sets 3x10 SLR 3x10 Hip abduction Tband Green 3x10 Hip adduction ball squeeze 10x10 sec Hamstring strap stretch 3x25 sec    11/15/23 Evaluation completed, HEP trial sets and pool exercises review   PATIENT EDUCATION:  Education details: HEP instruction and handout Person educated: Patient Education  method: Explanation, Demonstration, Tactile cues, Verbal cues, and Handouts Education comprehension: verbalized understanding, returned demonstration, and verbal cues required  HOME EXERCISE PROGRAM: Access Code: [email or web address] URL: [email or web address] Date: 11/15/2023 Prepared by: Burnard Meth  Exercises - Supine Quad Set  - 2 x daily - 7 x weekly - 2 sets - 10 reps - 2 hold - Supine Active Straight Leg Raise  - 2 x daily - 7 x weekly - 2 sets - 10 reps - Supine Hamstring Stretch with Strap  - 2 x daily - 7 x weekly - 1 sets - 2 reps  ASSESSMENT:  CLINICAL IMPRESSION: Pt needed VC for proper placement of towel for quad sets.  Demonstrated understanding.  OBJECTIVE IMPAIRMENTS: Abnormal gait, decreased balance, decreased endurance, difficulty walking, decreased ROM, decreased strength, hypomobility, impaired flexibility, and pain.   ACTIVITY LIMITATIONS: bending, standing, squatting, sleeping, stairs, and transfers  PARTICIPATION LIMITATIONS: driving, shopping, and community activity  PERSONAL FACTORS: 1-2 comorbidities:  also affecting patient's functional outcome.   REHAB POTENTIAL: Good  CLINICAL DECISION MAKING: Stable/uncomplicated  EVALUATION COMPLEXITY: Moderate   GOALS: Goals reviewed with patient? Yes  SHORT TERM GOALS: Target date: 12/13/2023    Pt to be independent with HEP. Baseline: Goal status: INITIAL  2.  Decrease pain with activity be 1 level. Baseline:  Goal status: INITIAL  LONG TERM GOALS: Target date: 01/10/2024    Increase strength by 1/2 grade or more in affected LLE. Baseline: 4 Goal status: INITIAL  2.  Improve PSFS score by 2 points Baseline: 3 Goal status: INITIAL  3.  Decrease max pain with activity to 2/10 or less Baseline:  Goal status:  INITIAL  4.  Able to ambulate short community distances with 2/10 or less pain. Baseline: pain>2/10 Goal status: INITIAL  5.  Decrease sit to stand time by 2 seconds. Baseline: 15 Goal status: INITIAL  6.  Increase PSFS by 2-3  to return to previous LOA. Baseline: 3 Goal status: INITIAL  7. Increase AROM to 0>100.  Baseline: 5>90  Goal status:INITIAL   PLAN:  PT FREQUENCY: 1-2x/week  PT DURATION: 8 weeks  PLANNED INTERVENTIONS: 97164- PT Re-evaluation, 97110-Therapeutic exercises, 97530- Therapeutic activity, [id number]- Neuromuscular re-education, 97535- Self Care, [id number]- Manual therapy, [id number]- Gait training, [phone]- Aquatic Therapy, [phone]- Vasopneumatic device, Patient/Family education, Balance training, Stair training, Joint mobilization, Cryotherapy, and Moist heat  PLAN FOR NEXT SESSION: Add leg press.   Burnard CHRISTELLA Meth, PT 12/01/2023, 8:43 AM   Referring diagnosis?  DX M17.12 (ICD-10-CM) - Primary osteoarthritis of left knee Treatment diagnosis? (if different than referring diagnosis) R26.89 M25.562 G89.29 M62.81 R60.9 What was this (referring dx) caused by? []  Surgery []  Fall []  Ongoing issue [x]  Arthritis []  Other: ____________  Laterality: []  Rt [x]  Lt []  Both  Check all possible CPT codes:  *CHOOSE 10 OR LESS*    See Planned Interventions listed in the Plan section of the Evaluation.

## 2023-12-04 NOTE — Therapy (Signed)
 OUTPATIENT PHYSICAL THERAPY LOWER EXTREMITY TREATMENT   Patient Name: Heather Andrews MRN: [id number] DOB:1940-12-08, 83 y.o., female Today's Date: 12/05/2023  END OF SESSION:  PT End of Session - 12/05/23 1111     Visit Number 3    Number of Visits 17    Date for PT Re-Evaluation 01/10/24    Authorization Type [employer]    PT Start Time 0800    PT Stop Time 0839    PT Time Calculation (min) 39 min    Activity Tolerance Patient tolerated treatment well    Behavior During Therapy [facility] for tasks assessed/performed            Past Medical History:  Diagnosis Date   Actinic keratosis 04/26/2021   Acute deep vein thrombosis (DVT) of distal end of left lower extremity (HCC) 09/08/2022   Cataracts, bilateral    Elevated cholesterol    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 Fontaine, OBGYN).   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) 09/08/2022   Counseling regarding advance care planning and goals of care 09/08/2022   History of asymptomatic stroke 04/29/2022   Stress incontinence, female 04/04/2022   Osteoporosis 04/04/2022   Actinic keratosis 04/04/2022   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   Overactive bladder 04/26/2021   Hearing impairment 04/26/2021   Allergic rhinitis 04/26/2021   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 03/19/2021   Prediabetes 03/18/2021   Vitamin D  deficiency 03/18/2021   Gastroesophageal reflux 03/18/2021   Primary osteoarthritis of left knee 07/03/2017   Essential hypertension    Pure hypercholesterolemia     PCP: McDiarmid, Krystal BIRCH, MD  REFERRING PROVIDER: Donah Laymon PARAS, MD  REFERRING DIAG: DX M17.12 (ICD-10-CM) - Primary osteoarthritis of left knee  THERAPY DIAG:  Other abnormalities of gait and mobility  Chronic pain of left knee  Muscle weakness (generalized)  Unsteadiness on feet  Rationale for Evaluation and Treatment: Rehabilitation  ONSET DATE: 3 months ago  SUBJECTIVE:   SUBJECTIVE STATEMENT: Pt states knee is pain free and has minimal restrictions with stuff around the house . She c/o R hip pain- it comes and goes. PERTINENT HISTORY: Lymphoma PAIN:  Are you having pain? Yes: NPRS scale: 0/10 today Pain location: L knee ant Pain description: achy, dull Aggravating factors: sit to stand after prolonged sitting, bending, pool Relieving factors: tylenol   PRECAUTIONS: None  RED FLAGS: None   WEIGHT BEARING RESTRICTIONS: No  FALLS:  Has patient fallen in last 6 months? No  LIVING ENVIRONMENT: Lives  with: lives alone Lives in: House/apartment Stairs: Yes: Internal: 12 steps; can reach both Has following equipment at home: None  OCCUPATION: retired  PLOF: Independent  PATIENT GOALS: Decrease pain with activity  NEXT MD VISIT: unknown  OBJECTIVE:  Note: Objective measures were completed at Evaluation unless otherwise noted.  DIAGNOSTIC FINDINGS:  No recent x rays of the knee  PATIENT SURVEYS:  PSFS: THE PATIENT SPECIFIC FUNCTIONAL SCALE  Place score of 0-10 (0 = unable to perform activity and 10 = able to perform activity at the same level as before  injury or problem)  Activity Date: 11/15/23    Walking 3    2.stairs 3    3.out of car 3    4.      Total Score 3      Total Score = Sum of activity scores/number of activities  Minimally Detectable Change: 3 points (for single activity); 2 points (for average score)  [employer] [facility] [facility] (nd). The Patient Specific Functional Scale . Retrieved from [email or web address]   COGNITION: Overall cognitive status: Within functional limits for tasks assessed     SENSATION: WFL  EDEMA:    MUSCLE LENGTH: Hamstrings:  Left moderate restriction   POSTURE:  No Significant postural limitations PALPATION: 2+ tenderness at medial joint line  LOWER EXTREMITY ROM:  AROM/PROM Right eval Left eval  Hip flexion    Hip extension    Hip abduction    Hip adduction    Hip internal rotation    Hip external rotation    Knee flexion  90/102  Knee extension  +5/+3  Ankle dorsiflexion    Ankle plantarflexion    Ankle inversion    Ankle eversion     (Blank rows = not tested)  LOWER EXTREMITY MMT:  MMT Right eval Left eval  Hip flexion  4+  Hip extension    Hip abduction  4  Hip adduction    Hip internal rotation    Hip external rotation    Knee flexion  5-  Knee extension  4  Ankle dorsiflexion    Ankle plantarflexion    Ankle inversion    Ankle eversion     (Blank rows = not tested)  LOWER EXTREMITY SPECIAL TESTS:  Bounce neg  FUNCTIONAL TESTS:  5 times sit to stand: 15 sec  GAIT: Distance walked: short community distances  Assistive device utilized: None Level of assistance: Complete Independence                                                                                                                                 TREATMENT DATE:  12/05/23 Nu step level 4 9 min Seated marching 2x20 Seated toe raises 25x Quad sets 3x10 SLR 3x10 Hip abduction Tband Green 3x10 Hip adduction ball squeeze 10x10  sec Hamstring strap stretch 3x25 sec  Bridges 3x10  12/01/23 Nu step level 2 8 min Seated marching 2x20 Seated toe raises 25x  Quad sets 3x10 SLR 3x10 Hip abduction Tband Green 3x10 Hip adduction ball squeeze 10x10 sec Hamstring strap stretch 3x25 sec   11/15/23 Evaluation completed, HEP trial sets and pool exercises review   PATIENT EDUCATION:  Education details: HEP instruction and handout Person educated: Patient Education method: Explanation, Demonstration, Tactile cues, Verbal cues, and Handouts Education comprehension: verbalized understanding, returned demonstration, and verbal cues required  HOME EXERCISE PROGRAM: Access Code: [email or web address] URL: [email or web address] Date: 11/15/2023 Prepared by: Burnard Meth  Exercises - Supine Quad Set  - 2 x daily - 7 x weekly - 2 sets - 10 reps - 2 hold - Supine Active Straight Leg Raise  - 2 x daily - 7 x weekly - 2 sets - 10 reps - Supine Hamstring Stretch with Strap  - 2 x daily - 7 x weekly - 1 sets - 2 reps  ASSESSMENT:  CLINICAL IMPRESSION: Pt needed VC for stabilization with bridges.  Demonstrated understanding.  OBJECTIVE IMPAIRMENTS: Abnormal gait, decreased balance, decreased endurance, difficulty walking, decreased ROM, decreased strength, hypomobility, impaired flexibility, and pain.   ACTIVITY LIMITATIONS: bending, standing, squatting, sleeping, stairs, and transfers  PARTICIPATION LIMITATIONS: driving, shopping, and community activity  PERSONAL FACTORS: 1-2 comorbidities:  also affecting patient's functional outcome.   REHAB POTENTIAL: Good  CLINICAL DECISION MAKING: Stable/uncomplicated  EVALUATION COMPLEXITY: Moderate   GOALS: Goals reviewed with patient? Yes  SHORT TERM GOALS: Target date: 12/13/2023    Pt to be independent with HEP. Baseline: Goal status: INITIAL  2.  Decrease pain with activity be 1 level. Baseline:  Goal status: INITIAL  LONG TERM GOALS: Target date: 01/10/2024     Increase strength by 1/2 grade or more in affected LLE. Baseline: 4 Goal status: INITIAL  2.  Improve PSFS score by 2 points Baseline: 3 Goal status: INITIAL  3.  Decrease max pain with activity to 2/10 or less Baseline:  Goal status: INITIAL  4.  Able to ambulate short community distances with 2/10 or less pain. Baseline: pain>2/10 Goal status: INITIAL  5.  Decrease sit to stand time by 2 seconds. Baseline: 15 Goal status: INITIAL  6.  Increase PSFS by 2-3  to return to previous LOA. Baseline: 3 Goal status: INITIAL  7. Increase AROM to 0>100.  Baseline: 5>90  Goal status:INITIAL   PLAN:  PT FREQUENCY: 1-2x/week  PT DURATION: 8 weeks  PLANNED INTERVENTIONS: 97164- PT Re-evaluation, 97110-Therapeutic exercises, 97530- Therapeutic activity, [id number]- Neuromuscular re-education, 97535- Self Care, 02859- Manual therapy, [id number]- Gait training, [phone]- Aquatic Therapy, [phone]- Vasopneumatic device, Patient/Family education, Balance training, Stair training, Joint mobilization, Cryotherapy, and Moist heat  PLAN FOR NEXT SESSION: Add leg press.   Burnard CHRISTELLA Meth, PT 12/05/2023, 11:13 AM   Referring diagnosis?  DX M17.12 (ICD-10-CM) - Primary osteoarthritis of left knee Treatment diagnosis? (if different than referring diagnosis) R26.89 M25.562 G89.29 M62.81 R60.9 What was this (referring dx) caused by? []  Surgery []  Fall []  Ongoing issue [x]  Arthritis []  Other: ____________  Laterality: []  Rt [x]  Lt []  Both  Check all possible CPT codes:  *CHOOSE 10 OR LESS*    See Planned Interventions listed in the Plan section of the Evaluation.

## 2023-12-05 ENCOUNTER — Ambulatory Visit

## 2023-12-05 DIAGNOSIS — R2689 Other abnormalities of gait and mobility: Secondary | ICD-10-CM | POA: Diagnosis not present

## 2023-12-05 DIAGNOSIS — M25562 Pain in left knee: Secondary | ICD-10-CM | POA: Diagnosis not present

## 2023-12-05 DIAGNOSIS — G8929 Other chronic pain: Secondary | ICD-10-CM

## 2023-12-05 DIAGNOSIS — M6281 Muscle weakness (generalized): Secondary | ICD-10-CM | POA: Diagnosis not present

## 2023-12-05 DIAGNOSIS — R2681 Unsteadiness on feet: Secondary | ICD-10-CM | POA: Diagnosis not present

## 2023-12-06 NOTE — Therapy (Signed)
 OUTPATIENT PHYSICAL THERAPY LOWER EXTREMITY TREATMENT   Patient Name: Heather Andrews MRN: [id number] DOB:May 04, 1940, 83 y.o., female Today's Date: 12/07/2023  END OF SESSION:  PT End of Session - 12/07/23 0859     Visit Number 4    Number of Visits 17    Date for PT Re-Evaluation 01/10/24    Authorization Type [employer]    PT Start Time 0857    PT Stop Time 0928    PT Time Calculation (min) 31 min    Activity Tolerance Patient tolerated treatment well    Behavior During Therapy [facility] for tasks assessed/performed             Past Medical History:  Diagnosis Date   Actinic keratosis 04/26/2021   Acute deep vein thrombosis (DVT) of distal end of left lower extremity (HCC) 09/08/2022   Cataracts, bilateral    Elevated cholesterol    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 Fontaine, OBGYN).   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) 09/08/2022   Counseling regarding advance care planning and goals of care 09/08/2022   History of asymptomatic stroke 04/29/2022   Stress incontinence, female 04/04/2022   Osteoporosis 04/04/2022   Actinic keratosis 04/04/2022   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   Overactive bladder 04/26/2021   Hearing impairment 04/26/2021   Allergic rhinitis 04/26/2021   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 03/19/2021   Prediabetes 03/18/2021   Vitamin D  deficiency 03/18/2021   Gastroesophageal reflux 03/18/2021   Primary osteoarthritis of left knee 07/03/2017   Essential hypertension    Pure hypercholesterolemia     PCP: McDiarmid, Krystal BIRCH, MD  REFERRING PROVIDER: Donah Laymon PARAS, MD  REFERRING DIAG: DX M17.12 (ICD-10-CM) - Primary osteoarthritis of left knee  THERAPY DIAG:  Chronic pain of left knee  Other abnormalities of gait and mobility  Muscle weakness (generalized)  Unsteadiness on feet  Edema, unspecified type  Rationale for Evaluation and Treatment: Rehabilitation  ONSET DATE: 3 months ago  SUBJECTIVE:   SUBJECTIVE STATEMENT: Patient states that she is feeling better than she has in a long time, though continues to have intermittent Rt hip pain. No pain noted.    PERTINENT HISTORY: Lymphoma PAIN:  Are you having pain? Yes: NPRS scale: 0/10 today Pain location: L knee ant Pain description: achy, dull Aggravating factors: sit to stand after prolonged sitting, bending, pool Relieving factors: tylenol   PRECAUTIONS: None  RED FLAGS: None   WEIGHT BEARING RESTRICTIONS: No  FALLS:  Has patient fallen in  last 6 months? No  LIVING ENVIRONMENT: Lives with: lives alone Lives in: House/apartment Stairs: Yes: Internal: 12 steps; can reach both Has following equipment at home: None  OCCUPATION: retired  PLOF: Independent  PATIENT GOALS: Decrease pain with activity  NEXT MD VISIT: unknown  OBJECTIVE:  Note: Objective measures were completed at Evaluation unless otherwise noted.  DIAGNOSTIC FINDINGS:  No recent x rays of the knee  PATIENT SURVEYS:  PSFS: THE PATIENT SPECIFIC FUNCTIONAL SCALE  Place score of 0-10 (0 = unable to perform activity and 10 = able to  perform activity at the same level as before injury or problem)  Activity Date: 11/15/23    Walking 3    2.stairs 3    3.out of car 3    4.      Total Score 3      Total Score = Sum of activity scores/number of activities  Minimally Detectable Change: 3 points (for single activity); 2 points (for average score)  [facility] (nd). The Patient Specific Functional Scale . Retrieved from [email or web address]   COGNITION: Overall cognitive status: Within functional limits for tasks assessed     SENSATION: WFL  EDEMA:    MUSCLE LENGTH: Hamstrings:  Left moderate restriction   POSTURE:  No Significant postural limitations PALPATION: 2+ tenderness at medial joint line  LOWER EXTREMITY ROM:  AROM/PROM Right eval Left eval  Hip flexion    Hip extension    Hip abduction    Hip adduction    Hip internal rotation    Hip external rotation    Knee flexion  90/102  Knee extension  +5/+3  Ankle dorsiflexion    Ankle plantarflexion    Ankle inversion    Ankle eversion     (Blank rows = not tested)  LOWER EXTREMITY MMT:  MMT Right eval Left eval  Hip flexion  4+  Hip extension    Hip abduction  4  Hip adduction    Hip internal rotation    Hip external rotation    Knee flexion  5-  Knee extension  4  Ankle dorsiflexion    Ankle plantarflexion    Ankle inversion    Ankle eversion     (Blank rows = not tested)  LOWER EXTREMITY SPECIAL TESTS:  Bounce neg  FUNCTIONAL TESTS:  5 times sit to stand: 15 sec  GAIT: Distance walked: short community distances  Assistive device utilized: None Level of assistance: Complete Independence                                                                                                                                 TREATMENT DATE:  12/07/2023 TherEx: Nustep seat 2, level 4 for 6 minutes  Bilat leg press with 87# 3x12 Supine bridges with red TB around  knees for abduction at top of movement 3x6  Supine adduction squeeze with ball between knees x6 with 3s hold, though noted  calf cramping so discontinued  Slant board gastroc stretch 3x30s   12/05/23 Nu step level 4 9 min Seated marching 2x20 Seated toe raises 25x Quad sets 3x10 SLR 3x10 Hip abduction Tband Green 3x10 Hip adduction ball squeeze 10x10 sec Hamstring strap stretch 3x25 sec  Bridges 3x10  12/01/23 Nu step level 2 8 min Seated marching 2x20 Seated toe raises 25x Quad sets 3x10 SLR 3x10 Hip abduction Tband Green 3x10 Hip adduction ball squeeze 10x10 sec Hamstring strap stretch 3x25 sec   11/15/23 Evaluation completed, HEP trial sets and pool exercises review   PATIENT EDUCATION:  Education details: HEP instruction and handout Person educated: Patient Education method: Explanation, Demonstration, Tactile cues, Verbal cues, and Handouts Education comprehension: verbalized understanding, returned demonstration, and verbal cues required  HOME EXERCISE PROGRAM: Access Code: [email or web address] URL: [email or web address] Date: 11/15/2023 Prepared by: Burnard Meth  Exercises - Supine Quad Set  - 2 x daily - 7 x weekly - 2 sets - 10 reps - 2 hold - Supine Active Straight Leg Raise  - 2 x daily - 7 x weekly - 2 sets - 10 reps - Supine Hamstring Stretch with Strap  - 2 x daily - 7 x weekly - 1 sets - 2 reps  ASSESSMENT:  CLINICAL IMPRESSION: Patient arriving late to session continuing to note intermittent Rt hip pain, but feeling generalized improvements. Patient tolerating all activities, though experiencing calf cramping in Lt LE toward end of session; improved with gastroc stretch. Patient will continue to benefit from skilled PT.    OBJECTIVE IMPAIRMENTS: Abnormal gait, decreased balance, decreased endurance, difficulty walking, decreased ROM, decreased strength, hypomobility, impaired flexibility, and pain.   ACTIVITY LIMITATIONS: bending, standing,  squatting, sleeping, stairs, and transfers  PARTICIPATION LIMITATIONS: driving, shopping, and community activity  PERSONAL FACTORS: 1-2 comorbidities:  also affecting patient's functional outcome.   REHAB POTENTIAL: Good  CLINICAL DECISION MAKING: Stable/uncomplicated  EVALUATION COMPLEXITY: Moderate   GOALS: Goals reviewed with patient? Yes  SHORT TERM GOALS: Target date: 12/13/2023    Pt to be independent with HEP. Baseline: Goal status: INITIAL  2.  Decrease pain with activity be 1 level. Baseline:  Goal status: INITIAL  LONG TERM GOALS: Target date: 01/10/2024    Increase strength by 1/2 grade or more in affected LLE. Baseline: 4 Goal status: INITIAL  2.  Improve PSFS score by 2 points Baseline: 3 Goal status: INITIAL  3.  Decrease max pain with activity to 2/10 or less Baseline:  Goal status: INITIAL  4.  Able to ambulate short community distances with 2/10 or less pain. Baseline: pain>2/10 Goal status: INITIAL  5.  Decrease sit to stand time by 2 seconds. Baseline: 15 Goal status: INITIAL  6.  Increase PSFS by 2-3  to return to previous LOA. Baseline: 3 Goal status: INITIAL  7. Increase AROM to 0>100.  Baseline: 5>90  Goal status:INITIAL   PLAN:  PT FREQUENCY: 1-2x/week  PT DURATION: 8 weeks  PLANNED INTERVENTIONS: 97164- PT Re-evaluation, 97110-Therapeutic exercises, 97530- Therapeutic activity, [id number]- Neuromuscular re-education, 97535- Self Care, [id number]- Manual therapy, [id number]- Gait training, [phone]- Aquatic Therapy, [phone]- Vasopneumatic device, Patient/Family education, Balance training, Stair training, Joint mobilization, Cryotherapy, and Moist heat  PLAN FOR NEXT SESSION: Add leg press.   Susannah Daring, PT, DPT 12/07/23 11:09 AM    Referring diagnosis?  DX M17.12 (ICD-10-CM) - Primary osteoarthritis of left knee Treatment diagnosis? (if different than referring diagnosis) R26.89 M25.562 G89.29 M62.81 R60.9 What was this (referring dx)  caused by? []  Surgery []   Fall []  Ongoing issue [x]  Arthritis []  Other: ____________  Laterality: []  Rt [x]  Lt []  Both  Check all possible CPT codes:  *CHOOSE 10 OR LESS*    See Planned Interventions listed in the Plan section of the Evaluation.

## 2023-12-07 ENCOUNTER — Ambulatory Visit

## 2023-12-07 DIAGNOSIS — R2681 Unsteadiness on feet: Secondary | ICD-10-CM

## 2023-12-07 DIAGNOSIS — G8929 Other chronic pain: Secondary | ICD-10-CM

## 2023-12-07 DIAGNOSIS — M6281 Muscle weakness (generalized): Secondary | ICD-10-CM | POA: Diagnosis not present

## 2023-12-07 DIAGNOSIS — R2689 Other abnormalities of gait and mobility: Secondary | ICD-10-CM | POA: Diagnosis not present

## 2023-12-07 DIAGNOSIS — R609 Edema, unspecified: Secondary | ICD-10-CM | POA: Diagnosis not present

## 2023-12-07 DIAGNOSIS — M25562 Pain in left knee: Secondary | ICD-10-CM | POA: Diagnosis not present

## 2023-12-11 NOTE — Therapy (Signed)
 OUTPATIENT PHYSICAL THERAPY LOWER EXTREMITY TREATMENT   Patient Name: Heather Andrews MRN: [id number] DOB:1940/12/07, 83 y.o., female Today's Date: 12/12/2023  END OF SESSION:  PT End of Session - 12/12/23 0856     Visit Number 5    Number of Visits 17    Date for PT Re-Evaluation 01/10/24    Authorization Type [employer]    PT Start Time 0800    PT Stop Time 0853    PT Time Calculation (min) 53 min    Activity Tolerance Patient tolerated treatment well    Behavior During Therapy [facility] for tasks assessed/performed              Past Medical History:  Diagnosis Date   Actinic keratosis 04/26/2021   Acute deep vein thrombosis (DVT) of distal end of left lower extremity (HCC) 09/08/2022   Cataracts, bilateral    Elevated cholesterol    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 Fontaine, OBGYN).   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) 09/08/2022   Counseling regarding advance care planning and goals of care 09/08/2022   History of asymptomatic stroke 04/29/2022   Stress incontinence, female 04/04/2022   Osteoporosis 04/04/2022   Actinic keratosis 04/04/2022   COPD  with asthma (HCC) 04/27/2021   Overactive bladder 04/26/2021   Hearing impairment 04/26/2021   Allergic rhinitis 04/26/2021   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 03/19/2021   Prediabetes 03/18/2021   Vitamin D  deficiency 03/18/2021   Gastroesophageal reflux 03/18/2021   Primary osteoarthritis of left knee 07/03/2017   Essential hypertension    Pure hypercholesterolemia     PCP: McDiarmid, Krystal BIRCH, MD  REFERRING PROVIDER: Donah Laymon PARAS, MD  REFERRING DIAG: DX M17.12 (ICD-10-CM) - Primary osteoarthritis of left knee  THERAPY DIAG:  Chronic pain of left knee  Other abnormalities of gait and mobility  Muscle weakness (generalized)  Unsteadiness on feet  Edema, unspecified type  Rationale for Evaluation and Treatment: Rehabilitation  ONSET DATE: 3 months ago  SUBJECTIVE:   SUBJECTIVE STATEMENT: Patient states knee pain is minimal.  Feeling more mobile. PERTINENT HISTORY: Lymphoma PAIN:  Are you having pain? Yes: NPRS scale: 0/10 today Pain location: L knee ant Pain description: achy, dull Aggravating factors: sit to stand after prolonged sitting, bending, pool Relieving factors: tylenol   PRECAUTIONS: None  RED FLAGS: None   WEIGHT BEARING RESTRICTIONS: No  FALLS:  Has patient fallen in last 6 months? No  LIVING ENVIRONMENT: Lives with: lives alone Lives in: House/apartment Stairs: Yes: Internal:  12 steps; can reach both Has following equipment at home: None  OCCUPATION: retired  PLOF: Independent  PATIENT GOALS: Decrease pain with activity  NEXT MD VISIT: unknown  OBJECTIVE:  Note: Objective measures were completed at Evaluation unless otherwise noted.  DIAGNOSTIC FINDINGS:  No recent x rays of the knee  PATIENT SURVEYS:  PSFS: THE PATIENT SPECIFIC FUNCTIONAL SCALE  Place score of 0-10 (0 = unable to perform activity and 10 = able to perform activity at the same level as before injury or problem)  Activity Date:  11/15/23    Walking 3    2.stairs 3    3.out of car 3    4.      Total Score 3      Total Score = Sum of activity scores/number of activities  Minimally Detectable Change: 3 points (for single activity); 2 points (for average score)  [employer] [facility] (nd). The Patient Specific Functional Scale . Retrieved from [email or web address]   COGNITION: Overall cognitive status: Within functional limits for tasks assessed     SENSATION: WFL  EDEMA:    MUSCLE LENGTH: Hamstrings:  Left moderate restriction   POSTURE:  No Significant postural limitations PALPATION: 2+ tenderness at medial joint line  LOWER EXTREMITY ROM:  AROM/PROM Right eval Left eval  Hip flexion    Hip extension    Hip abduction    Hip adduction    Hip internal rotation    Hip external rotation    Knee flexion  90/102  Knee extension  +5/+3  Ankle dorsiflexion    Ankle plantarflexion    Ankle inversion    Ankle eversion     (Blank rows = not tested)  LOWER EXTREMITY MMT:  MMT Right eval Left eval  Hip flexion  4+  Hip extension    Hip abduction  4  Hip adduction    Hip internal rotation    Hip external rotation    Knee flexion  5-  Knee extension  4  Ankle dorsiflexion    Ankle plantarflexion    Ankle inversion    Ankle eversion     (Blank rows = not tested)  LOWER EXTREMITY SPECIAL TESTS:  Bounce neg  FUNCTIONAL TESTS:  5 times sit to stand: 15 sec  GAIT: Distance walked: short community distances  Assistive device utilized: None Level of assistance: Complete Independence                                                                                                                                 TREATMENT DATE:  12/12/23  [id number] TherEx: Nustep seat 2, level 4 for 6 minutes   Slant board gastroc stretch 3x30s  Quad sets L 2x10 SLR L 2x10 SAQ L 2x10 Neuro Re ed- for improved posture, sit to stand and stair  activity Sit to stand holding 5# KB 2x10 Supine bridges 3x10 Bilat leg press with 87# 3x12 Supine adduction squeeze with  ball between knees 10x 10 sec Hip abduction with T band  green 3x10   12/07/2023 TherEx: Nustep seat 2, level 4 for 6 minutes  Bilat leg press with 87# 3x12 Supine bridges with red TB around knees for abduction at top of movement 3x6  Supine adduction squeeze with ball between knees x6 with 3s hold, though noted calf cramping so discontinued  Slant board gastroc stretch 3x30s   12/05/23 Nu step level 4 9 min Seated marching 2x20 Seated toe raises 25x Quad sets 3x10 SLR 3x10 Hip abduction Tband Green 3x10 Hip adduction ball squeeze 10x10 sec Hamstring strap stretch 3x25 sec  Bridges 3x10  12/01/23 Nu step level 2 8 min Seated marching 2x20 Seated toe raises 25x Quad sets 3x10 SLR 3x10 Hip abduction Tband Green 3x10 Hip adduction ball squeeze 10x10 sec Hamstring strap stretch 3x25 sec   11/15/23 Evaluation completed, HEP trial sets and pool exercises review   PATIENT EDUCATION:  Education details: HEP instruction and handout Person educated: Patient Education method: Explanation, Demonstration, Tactile cues, Verbal cues, and Handouts Education comprehension: verbalized understanding, returned demonstration, and verbal cues required  HOME EXERCISE PROGRAM: Access Code: [email or web address] URL: [email or web address] Date: 11/15/2023 Prepared by: Burnard Meth  Exercises - Supine Quad Set  - 2 x daily - 7 x weekly - 2 sets - 10 reps - 2 hold - Supine Active Straight Leg Raise  - 2 x daily - 7 x weekly - 2 sets - 10 reps - Supine Hamstring Stretch with Strap  - 2 x daily - 7 x weekly - 1 sets - 2 reps  ASSESSMENT:  CLINICAL IMPRESSION: Pt demo increased strength with tolerating increased sets of bridges.  OBJECTIVE IMPAIRMENTS: Abnormal gait, decreased balance, decreased endurance, difficulty walking, decreased ROM, decreased strength,  hypomobility, impaired flexibility, and pain.   ACTIVITY LIMITATIONS: bending, standing, squatting, sleeping, stairs, and transfers  PARTICIPATION LIMITATIONS: driving, shopping, and community activity  PERSONAL FACTORS: 1-2 comorbidities:  also affecting patient's functional outcome.   REHAB POTENTIAL: Good  CLINICAL DECISION MAKING: Stable/uncomplicated  EVALUATION COMPLEXITY: Moderate   GOALS: Goals reviewed with patient? Yes  SHORT TERM GOALS: Target date: 12/13/2023    Pt to be independent with HEP. Baseline: Goal status: INITIAL  2.  Decrease pain with activity be 1 level. Baseline:  Goal status: INITIAL  LONG TERM GOALS: Target date: 01/10/2024    Increase strength by 1/2 grade or more in affected LLE. Baseline: 4 Goal status: INITIAL  2.  Improve PSFS score by 2 points Baseline: 3 Goal status: INITIAL  3.  Decrease max pain with activity to 2/10 or less Baseline:  Goal status: INITIAL  4.  Able to ambulate short community distances with 2/10 or less pain. Baseline: pain>2/10 Goal status: INITIAL  5.  Decrease sit to stand time by 2 seconds. Baseline: 15 Goal status: INITIAL  6.  Increase PSFS by 2-3  to return to previous LOA. Baseline: 3 Goal status: INITIAL  7. Increase AROM to 0>100.  Baseline: 5>90  Goal status:INITIAL   PLAN:  PT FREQUENCY: 1-2x/week  PT DURATION: 8 weeks  PLANNED INTERVENTIONS: 97164- PT Re-evaluation, 97110-Therapeutic exercises, 97530- Therapeutic activity, 97112- Neuromuscular re-education, 97535- Self Care, 02859- Manual therapy, [phone]- Gait training, [phone]- Aquatic Therapy, [phone]- Vasopneumatic device, Patient/Family education, Balance training, Stair training, Joint mobilization, Cryotherapy, and Moist heat  PLAN FOR NEXT SESSION: Add balance exercises   Burnard Meth, PT 12/12/23  8:58 AM    Referring diagnosis?  DX M17.12 (ICD-10-CM) - Primary osteoarthritis  of left knee Treatment diagnosis? (if different  than referring diagnosis) R26.89 M25.562 G89.29 M62.81 R60.9 What was this (referring dx) caused by? []  Surgery []  Fall []  Ongoing issue [x]  Arthritis []  Other: ____________  Laterality: []  Rt [x]  Lt []  Both  Check all possible CPT codes:  *CHOOSE 10 OR LESS*    See Planned Interventions listed in the Plan section of the Evaluation.

## 2023-12-12 ENCOUNTER — Ambulatory Visit: Payer: Medicare PPO | Admitting: Family Medicine

## 2023-12-12 ENCOUNTER — Ambulatory Visit

## 2023-12-12 DIAGNOSIS — R609 Edema, unspecified: Secondary | ICD-10-CM | POA: Diagnosis not present

## 2023-12-12 DIAGNOSIS — M25562 Pain in left knee: Secondary | ICD-10-CM | POA: Diagnosis not present

## 2023-12-12 DIAGNOSIS — G8929 Other chronic pain: Secondary | ICD-10-CM

## 2023-12-12 DIAGNOSIS — R2681 Unsteadiness on feet: Secondary | ICD-10-CM

## 2023-12-12 DIAGNOSIS — M6281 Muscle weakness (generalized): Secondary | ICD-10-CM | POA: Diagnosis not present

## 2023-12-12 DIAGNOSIS — R2689 Other abnormalities of gait and mobility: Secondary | ICD-10-CM | POA: Diagnosis not present

## 2023-12-13 NOTE — Therapy (Signed)
 OUTPATIENT PHYSICAL THERAPY LOWER EXTREMITY TREATMENT   Patient Name: Heather Andrews MRN: [id number] DOB:[birth year], 83 y.o., female Today's Date: 12/14/2023  END OF SESSION:  PT End of Session - 12/14/23 0804     Visit Number 6    Number of Visits 17    Date for PT Re-Evaluation 01/10/24    Authorization Type [employer]    PT Start Time 0800    PT Stop Time 0838    PT Time Calculation (min) 38 min    Activity Tolerance Patient tolerated treatment well    Behavior During Therapy [facility] for tasks assessed/performed               Past Medical History:  Diagnosis Date   Actinic keratosis 04/26/2021   Acute deep vein thrombosis (DVT) of distal end of left lower extremity (HCC) 09/08/2022   Cataracts, bilateral    Elevated cholesterol    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 Fontaine, OBGYN).   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) 09/08/2022   Counseling regarding advance care planning and goals of care 09/08/2022   History of asymptomatic stroke 04/29/2022   Stress incontinence, female 04/04/2022   Osteoporosis 04/04/2022   Actinic keratosis 04/04/2022   COPD  with asthma (HCC) 04/27/2021   Overactive bladder 04/26/2021   Hearing impairment 04/26/2021   Allergic rhinitis 04/26/2021   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 03/19/2021   Prediabetes 03/18/2021   Vitamin D  deficiency 03/18/2021   Gastroesophageal reflux 03/18/2021   Primary osteoarthritis of left knee 07/03/2017   Essential hypertension    Pure hypercholesterolemia     PCP: McDiarmid, Krystal BIRCH, MD  REFERRING PROVIDER: Donah Laymon PARAS, MD  REFERRING DIAG: DX M17.12 (ICD-10-CM) - Primary osteoarthritis of left knee  THERAPY DIAG:  Chronic pain of left knee  Other abnormalities of gait and mobility  Muscle weakness (generalized)  Unsteadiness on feet  Edema, unspecified type  Rationale for Evaluation and Treatment: Rehabilitation  ONSET DATE: 3 months ago  SUBJECTIVE:   SUBJECTIVE STATEMENT: Pt reports no issues in knee but had some R hamstring cramping in the last week. PERTINENT HISTORY: Lymphoma PAIN:  Are you having pain? Yes: NPRS scale: 0/10 today Pain location: L knee ant Pain description: achy, dull Aggravating factors: sit to stand after prolonged sitting, bending, pool Relieving factors: tylenol   PRECAUTIONS: None  RED FLAGS: None   WEIGHT BEARING RESTRICTIONS: No  FALLS:  Has patient fallen in last 6 months? No  LIVING ENVIRONMENT: Lives with: lives  alone Lives in: House/apartment Stairs: Yes: Internal: 12 steps; can reach both Has following equipment at home: None  OCCUPATION: retired  PLOF: Independent  PATIENT GOALS: Decrease pain with activity  NEXT MD VISIT: unknown  OBJECTIVE:  Note: Objective measures were completed at Evaluation unless otherwise noted.  DIAGNOSTIC FINDINGS:  No recent x rays of the knee  PATIENT SURVEYS:  PSFS: THE PATIENT SPECIFIC FUNCTIONAL SCALE  Place score of 0-10 (0 = unable to perform activity and 10 = able to perform activity at the same level as before injury or  problem)  Activity Date: 11/15/23    Walking 3    2.stairs 3    3.out of car 3    4.      Total Score 3      Total Score = Sum of activity scores/number of activities  Minimally Detectable Change: 3 points (for single activity); 2 points (for average score)  [facility] (nd). The Patient Specific Functional Scale . Retrieved from [email or web address]   COGNITION: Overall cognitive status: Within functional limits for tasks assessed     SENSATION: WFL  EDEMA:    MUSCLE LENGTH: Hamstrings:  Left moderate restriction   POSTURE:  No Significant postural limitations PALPATION: 2+ tenderness at medial joint line  LOWER EXTREMITY ROM:  AROM/PROM Right eval Left eval  Hip flexion    Hip extension    Hip abduction    Hip adduction    Hip internal rotation    Hip external rotation    Knee flexion  90/102  Knee extension  +5/+3  Ankle dorsiflexion    Ankle plantarflexion    Ankle inversion    Ankle eversion     (Blank rows = not tested)  LOWER EXTREMITY MMT:  MMT Right eval Left eval  Hip flexion  4+  Hip extension    Hip abduction  4  Hip adduction    Hip internal rotation    Hip external rotation    Knee flexion  5-  Knee extension  4  Ankle dorsiflexion    Ankle plantarflexion    Ankle inversion    Ankle eversion     (Blank rows = not tested)  LOWER EXTREMITY SPECIAL TESTS:  Bounce neg  FUNCTIONAL TESTS:  5 times sit to stand: 15 sec  GAIT: Distance walked: short community distances  Assistive device utilized: None Level of assistance: Complete Independence                                                                                                                                 TREATMENT DATE:  12/14/23 TherEx: Nustep seat 2, level 5 for 7 minutes   Slant board gastroc stretch 3x30s  Quad sets L 2x10 SLR L 2x10 #1 SAQ L 2x10 #1 Hamstring strap stretch 2x30  sec Neuro Re ed- for improved posture, sit to stand and stair activity Sit to stand holding 6# ball 2x10 Supine  bridges 3x10 Bilat leg press with 87# 1x12 #100 15x Supine adduction squeeze with ball between knees 10x 10 sec     12/12/23  [id number] TherEx: Nustep seat 2, level 4 for 6 minutes   Slant board gastroc stretch 3x30s  Quad sets L 2x10 SLR L 2x10 SAQ L 2x10 Neuro Re ed- for improved posture, sit to stand and stair activity Sit to stand holding 5# KB 2x10 Supine bridges 3x10 Bilat leg press with 87# 3x12 Supine adduction squeeze with ball between knees 10x 10 sec Hip abduction with T band  green 3x10   12/07/2023 TherEx: Nustep seat 2, level 4 for 6 minutes  Bilat leg press with 87# 3x12 Supine bridges with red TB around knees for abduction at top of movement 3x6  Supine adduction squeeze with ball between knees x6 with 3s hold, though noted calf cramping so discontinued  Slant board gastroc stretch 3x30s   12/05/23 Nu step level 4 9 min Seated marching 2x20 Seated toe raises 25x Quad sets 3x10 SLR 3x10 Hip abduction Tband Green 3x10 Hip adduction ball squeeze 10x10 sec Hamstring strap stretch 3x25 sec  Bridges 3x10  12/01/23 Nu step level 2 8 min Seated marching 2x20 Seated toe raises 25x Quad sets 3x10 SLR 3x10 Hip abduction Tband Green 3x10 Hip adduction ball squeeze 10x10 sec Hamstring strap stretch 3x25 sec   11/15/23 Evaluation completed, HEP trial sets and pool exercises review   PATIENT EDUCATION:  Education details: HEP instruction and handout Person educated: Patient Education method: Explanation, Demonstration, Tactile cues, Verbal cues, and Handouts Education comprehension: verbalized understanding, returned demonstration, and verbal cues required  HOME EXERCISE PROGRAM: Access Code: [email or web address] URL: [email or web address] Date: 11/15/2023 Prepared by: Burnard Meth  Exercises - Supine Quad Set  - 2 x daily - 7 x weekly - 2  sets - 10 reps - 2 hold - Supine Active Straight Leg Raise  - 2 x daily - 7 x weekly - 2 sets - 10 reps - Supine Hamstring Stretch with Strap  - 2 x daily - 7 x weekly - 1 sets - 2 reps  ASSESSMENT:  CLINICAL IMPRESSION: Pt c/o some cramping in the R hamstring- better after strap stretch. STG met.  OBJECTIVE IMPAIRMENTS: Abnormal gait, decreased balance, decreased endurance, difficulty walking, decreased ROM, decreased strength, hypomobility, impaired flexibility, and pain.   ACTIVITY LIMITATIONS: bending, standing, squatting, sleeping, stairs, and transfers  PARTICIPATION LIMITATIONS: driving, shopping, and community activity  PERSONAL FACTORS: 1-2 comorbidities:  also affecting patient's functional outcome.   REHAB POTENTIAL: Good  CLINICAL DECISION MAKING: Stable/uncomplicated  EVALUATION COMPLEXITY: Moderate   GOALS: Goals reviewed with patient? Yes  SHORT TERM GOALS: Target date: 12/13/2023    Pt to be independent with HEP. Baseline: Goal status: MET8/28  2.  Decrease pain with activity be 1 level. Baseline:  Goal status: MET8/28  LONG TERM GOALS: Target date: 01/10/2024    Increase strength by 1/2 grade or more in affected LLE. Baseline: 4 Goal status: INITIAL  2.  Improve PSFS score by 2 points Baseline: 3 Goal status: INITIAL  3.  Decrease max pain with activity to 2/10 or less Baseline:  Goal status: INITIAL  4.  Able to ambulate short community distances with 2/10 or less pain. Baseline: pain>2/10 Goal status: INITIAL  5.  Decrease sit to stand time by 2 seconds. Baseline: 15 Goal status: INITIAL  6.  Increase PSFS by 2-3  to return to previous LOA. Baseline: 3 Goal status: INITIAL  7. Increase AROM  to 0>100.  Baseline: 5>90  Goal status:INITIAL   PLAN:  PT FREQUENCY: 1-2x/week  PT DURATION: 8 weeks  PLANNED INTERVENTIONS: 97164- PT Re-evaluation, 97110-Therapeutic exercises, 97530- Therapeutic activity, [id number]- Neuromuscular  re-education, 97535- Self Care, 02859- Manual therapy, [id number]- Gait training, [id number]- Aquatic Therapy, [id number]- Vasopneumatic device, Patient/Family education, Balance training, Stair training, Joint mobilization, Cryotherapy, and Moist heat  PLAN FOR NEXT SESSION: Add balance exercises.   Burnard Meth, PT 12/14/23  8:06 AM    Referring diagnosis?  DX M17.12 (ICD-10-CM) - Primary osteoarthritis of left knee Treatment diagnosis? (if different than referring diagnosis) R26.89 M25.562 G89.29 M62.81 R60.9 What was this (referring dx) caused by? []  Surgery []  Fall []  Ongoing issue [x]  Arthritis []  Other: ____________  Laterality: []  Rt [x]  Lt []  Both  Check all possible CPT codes:  *CHOOSE 10 OR LESS*    See Planned Interventions listed in the Plan section of the Evaluation.

## 2023-12-14 ENCOUNTER — Ambulatory Visit

## 2023-12-14 DIAGNOSIS — R2689 Other abnormalities of gait and mobility: Secondary | ICD-10-CM

## 2023-12-14 DIAGNOSIS — R2681 Unsteadiness on feet: Secondary | ICD-10-CM | POA: Diagnosis not present

## 2023-12-14 DIAGNOSIS — G8929 Other chronic pain: Secondary | ICD-10-CM | POA: Diagnosis not present

## 2023-12-14 DIAGNOSIS — M6281 Muscle weakness (generalized): Secondary | ICD-10-CM | POA: Diagnosis not present

## 2023-12-14 DIAGNOSIS — M25562 Pain in left knee: Secondary | ICD-10-CM

## 2023-12-14 DIAGNOSIS — R609 Edema, unspecified: Secondary | ICD-10-CM | POA: Diagnosis not present

## 2023-12-15 NOTE — Therapy (Addendum)
 OUTPATIENT PHYSICAL THERAPY LOWER EXTREMITY TREATMENT   Patient Name: Heather Andrews MRN: [id number] DOB:April 26, 1940, 83 y.o., female Today's Date: 12/19/2023    END OF SESSION:  PT End of Session - 12/19/23 0846     Visit Number 7    Number of Visits 17    Date for PT Re-Evaluation 01/10/24    Authorization Type [employer]    PT Start Time [phone]    PT Stop Time 0922    PT Time Calculation (min) 36 min    Activity Tolerance Patient tolerated treatment well    Behavior During Therapy [facility] for tasks assessed/performed             Past Medical History:  Diagnosis Date   Actinic keratosis 04/26/2021   Acute deep vein thrombosis (DVT) of distal end of left lower extremity (HCC) 09/08/2022   Cataracts, bilateral    Elevated cholesterol    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 Fontaine, OBGYN).   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) 09/08/2022   Counseling regarding advance care planning and goals of care 09/08/2022   History of asymptomatic stroke 04/29/2022   Stress incontinence, female 04/04/2022   Osteoporosis 04/04/2022   Actinic keratosis 04/04/2022   COPD  with asthma (HCC) 04/27/2021   Overactive bladder 04/26/2021   Hearing impairment 04/26/2021   Allergic rhinitis 04/26/2021   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 03/19/2021   Prediabetes 03/18/2021   Vitamin D  deficiency 03/18/2021   Gastroesophageal reflux 03/18/2021   Primary osteoarthritis of left knee 07/03/2017   Essential hypertension    Pure hypercholesterolemia     PCP: McDiarmid, Krystal BIRCH, MD  REFERRING PROVIDER: Donah Laymon PARAS, MD  REFERRING DIAG: DX M17.12 (ICD-10-CM) - Primary osteoarthritis of left knee  THERAPY DIAG:  Chronic pain of left knee  Other abnormalities of gait and mobility  Muscle weakness (generalized)  Unsteadiness on feet  Edema, unspecified type  Rationale for Evaluation and Treatment: Rehabilitation  ONSET DATE: 3 months ago  SUBJECTIVE:   SUBJECTIVE STATEMENT: Patient reporting sciatica pain following last session, though dissipated over 24 hours. No pain or discomfort in knee.  PERTINENT HISTORY: Lymphoma PAIN:  Are you having pain? Yes: NPRS scale: 0/10 today Pain location: L knee ant Pain description: achy, dull Aggravating factors: sit to stand after prolonged sitting, bending, pool Relieving factors: tylenol   PRECAUTIONS: None  RED FLAGS: None   WEIGHT BEARING RESTRICTIONS: No  FALLS:  Has patient fallen in last 6 months? No  LIVING ENVIRONMENT:  Lives with: lives alone Lives in: House/apartment Stairs: Yes: Internal: 12 steps; can reach both Has following equipment at home: None  OCCUPATION: retired  PLOF: Independent  PATIENT GOALS: Decrease pain with activity  NEXT MD VISIT: unknown  OBJECTIVE:  Note: Objective measures were completed at Evaluation unless otherwise noted.  DIAGNOSTIC FINDINGS:  No recent x rays of the knee  PATIENT SURVEYS:  PSFS: THE PATIENT SPECIFIC FUNCTIONAL SCALE  Place score of 0-10 (0 = unable to perform activity and 10 = able to perform activity at  the same level as before injury or problem)  Activity Date: 11/15/23 12/19/2023   Walking 3 7   2.stairs 3 7   3.out of car 3 7   4.      Total Score 3 7     Total Score = Sum of activity scores/number of activities  Minimally Detectable Change: 3 points (for single activity); 2 points (for average score)  [facility] (nd). The Patient Specific Functional Scale . Retrieved from [email or web address]   COGNITION: Overall cognitive status: Within functional limits for tasks assessed     SENSATION: WFL  EDEMA:    MUSCLE LENGTH: Hamstrings:  Left moderate restriction   POSTURE:  No Significant postural limitations PALPATION: 2+ tenderness at medial joint line  LOWER EXTREMITY ROM:  AROM/PROM Right eval Left eval   Hip flexion     Hip extension     Hip abduction     Hip adduction     Hip internal rotation     Hip external rotation     Knee flexion  90/102 94  Knee extension  +5/+3 4deg flexion  Ankle dorsiflexion     Ankle plantarflexion     Ankle inversion     Ankle eversion      (Blank rows = not tested)  LOWER EXTREMITY MMT:  MMT Right eval Left eval   Hip flexion  4+ 5  Hip extension     Hip abduction  4 4+  Hip adduction     Hip internal rotation     Hip external rotation     Knee flexion  5- 5  Knee extension  4 5  Ankle dorsiflexion     Ankle plantarflexion     Ankle inversion     Ankle eversion      (Blank rows = not tested)  LOWER EXTREMITY SPECIAL TESTS:  Bounce neg  FUNCTIONAL TESTS:  5 times sit to stand: 15 sec  12/19/2023: 11.08s  GAIT: Distance walked: short community distances  Assistive device utilized: None Level of assistance: Complete Independence                                                                                                                                 TREATMENT DATE:  12/19/2023 TherEx:  Attempted recumbent bike, but patient with  complaints of increased pain in knee; discontinued  SLR with quad set 1x10  each leg with no weight, 1x10 each leg with 1.5# Seated LAQ 2x15 each leg   Self-Care Discussed difficult tasks/PSFS, aquatic therapy options  Physical Performance:  MMT, ROM, PSFS with results noted above    12/14/23 TherEx: Nustep seat 2, level 5 for 7 minutes   Slant board gastroc stretch 3x30s  Quad sets L 2x10 SLR L 2x10 #1 SAQ L 2x10 #1 Hamstring strap stretch 2x30 sec Neuro Re ed- for improved posture, sit to stand and stair activity Sit to stand holding 6# ball 2x10 Supine bridges 3x10 Bilat leg press with 87# 1x12 #100 15x Supine adduction squeeze with ball between knees 10x 10 sec     12/12/23  [id number] TherEx: Nustep seat 2, level 4 for 6 minutes   Slant board gastroc stretch 3x30s  Quad sets L 2x10 SLR L 2x10 SAQ L 2x10 Neuro Re ed- for improved posture, sit to stand and stair activity Sit to stand holding 5# KB 2x10 Supine bridges 3x10 Bilat leg press with 87# 3x12 Supine adduction squeeze with ball between knees 10x 10 sec Hip abduction with T band  green 3x10   12/07/2023 TherEx: Nustep seat 2, level 4 for 6 minutes  Bilat leg press with 87# 3x12 Supine bridges with red TB around knees for abduction at top of movement 3x6  Supine adduction squeeze with ball between knees x6 with 3s hold, though noted calf cramping so discontinued  Slant board gastroc stretch 3x30s    PATIENT EDUCATION:  Education details: HEP instruction and handout Person educated: Patient Education method: Explanation, Demonstration, Tactile cues, Verbal cues, and Handouts Education comprehension: verbalized understanding, returned demonstration, and verbal cues required  HOME EXERCISE PROGRAM: Access Code: [email or web address] URL: [email or web address] Date: 11/15/2023 Prepared by: Burnard Meth  Exercises - Supine Quad Set  - 2 x daily - 7 x weekly - 2 sets - 10 reps - 2 hold - Supine Active  Straight Leg Raise  - 2 x daily - 7 x weekly - 2 sets - 10 reps - Supine Hamstring Stretch with Strap  - 2 x daily - 7 x weekly - 1 sets - 2 reps  ASSESSMENT:  CLINICAL IMPRESSION: Patient arrived to session noting sciatica-like pain after last session that dissipated over 24 hours, though has no complaints of knee pain. Patient tolerated all activities and was interested in transitioning over to aquatic therapy to continue progress. Patient has made moderate progress throughout her course of care.   OBJECTIVE IMPAIRMENTS: Abnormal gait, decreased balance, decreased endurance, difficulty walking, decreased ROM, decreased strength, hypomobility, impaired flexibility, and pain.   ACTIVITY LIMITATIONS: bending, standing, squatting, sleeping, stairs, and transfers  PARTICIPATION LIMITATIONS: driving, shopping, and community activity  PERSONAL FACTORS: 1-2 comorbidities:  also affecting patient's functional outcome.   REHAB POTENTIAL: Good  CLINICAL DECISION MAKING: Stable/uncomplicated  EVALUATION COMPLEXITY: Moderate   GOALS: Goals reviewed with patient? Yes  SHORT TERM GOALS: Target date: 12/13/2023     Pt to be independent with HEP. Baseline: Goal status: MET8/28  2.  Decrease pain with activity be 1 level. Baseline:  Goal status: MET8/28  LONG TERM GOALS: Target date: 01/10/2024     Increase strength by 1/2 grade or more in affected LLE. Baseline: 4 Goal status: Goal not met, 12/19/2023  2.  Improve PSFS score by 2 points Baseline: 3 Goal status: GOAL MET, 12/19/2023  3.  Decrease max pain with activity to 2/10 or less Baseline:  Goal status: GOAL MET, 12/19/2023  4.  Able to ambulate short  community distances with 2/10 or less pain. Baseline: pain>2/10 Goal status: GOAL MET, 12/19/2023  5.  Decrease sit to stand time by 2 seconds. Baseline: 15 Goal status: GOAL MET, 12/19/2023  6.  Increase PSFS by 2-3  to return to previous LOA. Baseline: 3 Goal status: GOAL MET,  12/19/2023  7. Increase AROM to 0>100.  Baseline: 5>90  Goal status: Goal not met, 12/19/2023   PLAN:  PT FREQUENCY: 1-2x/week  PT DURATION: 8 weeks  PLANNED INTERVENTIONS: 97164- PT Re-evaluation, 97110-Therapeutic exercises, 97530- Therapeutic activity, 97112- Neuromuscular re-education, 97535- Self Care, 02859- Manual therapy, [phone]- Gait training, [phone]- Aquatic Therapy, [phone]- Vasopneumatic device, Patient/Family education, Balance training, Stair training, Joint mobilization, Cryotherapy, and Moist heat  PLAN FOR NEXT SESSION: transition to [employer], PT, DPT 12/19/23 1:09 PM     Referring diagnosis?  DX M17.12 (ICD-10-CM) - Primary osteoarthritis of left knee Treatment diagnosis? (if different than referring diagnosis) R26.89 M25.562 G89.29 M62.81 R60.9 What was this (referring dx) caused by? []  Surgery []  Fall []  Ongoing issue [x]  Arthritis []  Other: ____________  Laterality: []  Rt [x]  Lt []  Both  Check all possible CPT codes:  *CHOOSE 10 OR LESS*    See Planned Interventions listed in the Plan section of the Evaluation.

## 2023-12-19 ENCOUNTER — Ambulatory Visit

## 2023-12-19 DIAGNOSIS — M25562 Pain in left knee: Secondary | ICD-10-CM | POA: Diagnosis not present

## 2023-12-19 DIAGNOSIS — R2689 Other abnormalities of gait and mobility: Secondary | ICD-10-CM

## 2023-12-19 DIAGNOSIS — M6281 Muscle weakness (generalized): Secondary | ICD-10-CM

## 2023-12-19 DIAGNOSIS — R2681 Unsteadiness on feet: Secondary | ICD-10-CM | POA: Diagnosis not present

## 2023-12-19 DIAGNOSIS — R609 Edema, unspecified: Secondary | ICD-10-CM | POA: Diagnosis not present

## 2023-12-19 DIAGNOSIS — G8929 Other chronic pain: Secondary | ICD-10-CM

## 2023-12-21 ENCOUNTER — Encounter

## 2023-12-26 ENCOUNTER — Encounter: Admitting: Physical Therapy

## 2023-12-27 ENCOUNTER — Ambulatory Visit (HOSPITAL_BASED_OUTPATIENT_CLINIC_OR_DEPARTMENT_OTHER): Admitting: Physical Therapy

## 2023-12-28 ENCOUNTER — Other Ambulatory Visit: Payer: Self-pay | Admitting: Physician Assistant

## 2023-12-28 ENCOUNTER — Encounter: Payer: Self-pay | Admitting: Hematology

## 2023-12-28 ENCOUNTER — Encounter

## 2023-12-28 DIAGNOSIS — C8294 Follicular lymphoma, unspecified, lymph nodes of axilla and upper limb: Secondary | ICD-10-CM

## 2023-12-28 NOTE — Progress Notes (Unsigned)
 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  INTERVAL HISTORY:  Heather Andrews is a 83 y.o. female here for continued evaluation and management of follicular lymphoma prior to her next dose of Rituxan  therapy.  She is accompanied by her son-in-law for this visit.   [name] reports having ongoing fatigue but  continues to complete all her ADLs at baseline.  She has a good appetite without any noted weight loss.  She denies nausea, vomiting or bowel habit changes.  Patient denies easy bruising or signs of overt bleeding such as hematochezia or melena.  Patient denies any noticeable lumps or bumps.  She denies fevers, chills, night sweats, shortness of breath, chest pain or cough.  She has no other complaints.  MEDICAL HISTORY: . 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 Fontaine, OBGYN).   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   Sensorineural hearing loss (SNHL) of both ears    Patient wears biaural hearing aids   Stress incontinence, female    USI    SURGICAL HISTORY: Past Surgical History:  Procedure Laterality Date   BASAL CELL CARCINOMA  EXCISION     CATARACT EXTRACTION     Cyst removed from chest     EYE SURGERY     TO CORRECT LAZY EYE    SOCIAL HISTORY: Social History   Socioeconomic History   Marital status: Widowed    Spouse name: Not on file   Number of children: 3   Years of education: 6   Highest education level: Master's degree (e.g., MA, MS, MEng, MEd, MSW, MBA)  Occupational History   Occupation: Tourist information centre manager    Comment: Retired  Tobacco Use   Smoking status: Former    Current packs/day: 0.50    Average packs/day: 0.5 packs/day for 67.7 years (33.8 ttl pk-yrs)    Types: Cigarettes    Start date: 04/1956   Smokeless tobacco: Never  Vaping Use   Vaping status: Never Used  Substance and Sexual Activity   Alcohol use: Yes    Comment: occassionally   Drug use: No   Sexual activity: Never    Comment: 1st intercourse 83 yo-Fewer than 5 partners  Other Topics Concern   Not on file  Social History Narrative   Widowed, lives alone though a daughter lives next door.   3 children: Heather Andrews; Heather Andrews, Heather Andrews   Activities: reading, interacting with her two cats, playing cards, corresponding with others (email, snail mail), needlework, cooking   Patient indicated on Geri Assessment questionnaire that she would want her daughter, Heather Andrews to make medical decisions on her behalf should she  be unable. [phone]).  The patient gives permission to speak to Ms Romnet about issue around the patient's health.      Patient lives in a townhouse.  13 steps to get into home.  Home has two levels.    Assists Devices in home: Grab Bars   Regular exercise: Yoga once a week   Transportation: Patient drives   Social Drivers of Health   Financial Resource Strain: Low Risk  (01/11/2023)   Overall Financial Resource Strain (CARDIA)    Difficulty of Paying Living Expenses: Not hard at all  Food Insecurity: No Food Insecurity (01/11/2023)   Hunger Vital Sign    Worried About Running Out  of Food in the Last Year: Never true    Ran Out of Food in the Last Year: Never true  Transportation Needs: No Transportation Needs (01/11/2023)   PRAPARE - Administrator, Civil Service (Medical): No    Lack of Transportation (Non-Medical): No  Physical Activity: Insufficiently Active (01/11/2023)   Exercise Vital Sign    Days of Exercise per Week: 4 days    Minutes of Exercise per Session: 30 min  Stress: No Stress Concern Present (01/11/2023)   [employer] of Occupational Health - Occupational Stress Questionnaire    Feeling of Stress : Not at all  Social Connections: Moderately Integrated (01/11/2023)   Social Connection and Isolation Panel    Frequency of Communication with Friends and Family: More than three times a week    Frequency of Social Gatherings with Friends and Family: Twice a week    Attends Religious Services: More than 4 times per year    Active Member of [employer] or Organizations: Yes    Attends Banker Meetings: More than 4 times per year    Marital Status: Widowed  Intimate Partner Violence: Not At Risk (12/30/2022)   Humiliation, Afraid, Rape, and Kick questionnaire    Fear of Current or Ex-Partner: No    Emotionally Abused: No    Physically Abused: No    Sexually Abused: No    FAMILY HISTORY: Family History  Problem Relation Age of Onset   Hypertension Mother    Heart disease Paternal Grandmother    Heart disease Paternal Grandfather    Cancer Brother        Leukemia    ALLERGIES:  is allergic to lisinopril, ephedrine-guaifenesin, and rituximab .  MEDICATIONS:  Current Outpatient Medications  Medication Sig Dispense Refill   albuterol  (VENTOLIN  HFA) 108 (90 Base) MCG/ACT inhaler Inhale 2 puffs into the lungs every 6 (six) hours as needed for wheezing or shortness of breath. 18 g 5   calcium  carbonate (OSCAL) 1500 (600 Ca) MG TABS tablet Take 600 mg by mouth 2 (two) times daily.     Cholecalciferol (VITAMIN D3) 125 MCG (5000  UT) CAPS Take 125 mcg by mouth daily.     Cyanocobalamin (VITAMIN B-12 PO) Take 1 tablet by mouth daily.     denosumab  (PROLIA ) 60 MG/ML SOSY injection Inject 60 mg into the skin every 6 (six) months.     diclofenac  Sodium (VOLTAREN ) 1 % GEL Apply 2 g topically 4 (four) times daily. 50 g 2   ELIQUIS  5 MG TABS tablet TAKE 1 TABLET BY MOUTH TWICE A DAY 60 tablet 4   FLUAD 0.5 ML injection  (Patient not taking: Reported on 10/31/2023)     GEMTESA 75 MG TABS Take 1 tablet by mouth daily.     hydrochlorothiazide  (HYDRODIURIL ) 25 MG tablet TAKE  1 TABLET (25 MG TOTAL) BY MOUTH DAILY. 90 tablet 3   Menaquinone-7 (VITAMIN K2) 100 MCG CAPS as directed Orally (Patient not taking: Reported on 10/31/2023)     rosuvastatin  (CRESTOR ) 20 MG tablet TAKE 1 TABLET BY MOUTH EVERY DAY 90 tablet 3   SPIKEVAX syringe  (Patient not taking: Reported on 10/31/2023)     Tiotropium Bromide-Olodaterol (STIOLTO RESPIMAT ) 2.5-2.5 MCG/ACT AERS INHALE 2 PUFFS BY MOUTH INTO THE LUNGS DAILY 4 g 11   No current facility-administered medications for this visit.    REVIEW OF SYSTEMS:    10 Point review of Systems was done is negative except as noted above.   PHYSICAL EXAMINATION: ECOG PERFORMANCE STATUS: 1 - Symptomatic but completely ambulatory  . Vitals:   12/29/23 0901  BP: (!) 130/53  Pulse: 72  Resp: 15  Temp: 97.9 F (36.6 C)  SpO2: 95%    Filed Weights   12/29/23 0901  Weight: 173 lb (78.5 kg)    .Body mass index is 34.94 kg/m. GENERAL:alert, in no acute distress and comfortable SKIN: no acute rashes, no significant lesions EYES: conjunctiva are pink and non-injected, sclera anicteric OROPHARYNX: MMM, no exudates, no oropharyngeal erythema or ulceration NECK: supple, no JVD LYMPH:  no palpable lymphadenopathy in the cervical or supraclavicular regions LUNGS: clear to auscultation b/l with normal respiratory effort HEART: regular rate & rhythm Extremity: no pedal edema PSYCH: alert & oriented x 3 with  fluent speech NEURO: no focal motor/sensory deficits   LABORATORY DATA:  I have reviewed the data as listed .    Latest Ref Rng & Units 12/29/2023    8:43 AM 10/31/2023    9:24 AM 08/29/2023   11:11 AM  CBC  WBC 4.0 - 10.5 K/uL 7.7  7.2  7.9   Hemoglobin 12.0 - 15.0 g/dL 87.7  88.1  87.9   Hematocrit 36.0 - 46.0 % 36.5  34.9  35.5   Platelets 150 - 400 K/uL 231  226  227    .    Latest Ref Rng & Units 12/29/2023    8:43 AM 11/01/2023    9:33 AM 10/31/2023    9:24 AM  CMP  Glucose 70 - 99 mg/dL 885  863  884   BUN 8 - 23 mg/dL 21  23  22    Creatinine 0.44 - 1.00 mg/dL 9.14  9.32  9.25   Sodium 135 - 145 mmol/L 139  141  141   Potassium 3.5 - 5.1 mmol/L 3.8  4.1  3.8   Chloride 98 - 111 mmol/L 102  102  105   CO2 22 - 32 mmol/L 29  28  29    Calcium  8.9 - 10.3 mg/dL 9.5  9.5  9.5   Total Protein 6.5 - 8.1 g/dL 6.8   6.6   Total Bilirubin 0.0 - 1.2 mg/dL 0.3   0.4   Alkaline Phos 38 - 126 U/L 45   44   AST 15 - 41 U/L 16   21   ALT 0 - 44 U/L 11   17    . Lab Results  Component Value Date   LDH 176 10/31/2023      Needle core biopsy 07/27/2022:   Breast Ultrasound 07/20/2022:  Ultrasound guided biopsy:  Ultrasound guided biopsy:       RADIOGRAPHIC STUDIES: I have personally reviewed the radiological images as listed and agreed with the findings in the report. No results found.  ASSESSMENT & PLAN:  Heather Andrews is  a 83 y.o. female who presents for stage IV low grade follicular lymphoma.  Stage IV low grade follicular lymphoma incidentally noted LNadenopathy on routine mammogram. History of asymptomatic stroke  Hypertension  Pure hypercholesterolemia  Contusion of right shoulder region  6.  Hx of Acute DVT of left peroneal veins  PLAN:  -Reviewed labs from today that show WBC 7.8, Hgb 12.2, MCV 94.1, Plt 231K. Creatinine and LFTs are in range.  -No prohibitive toxicities identified. Proceed with Rituxan  therapy today.  -Continue maintenance Rituxan   every 8 weeks -Due for repeat CT CAP around Spring 2026.  FOLLOW-UP: RTC in 2 months with labs and follow up prior to Rituximab    All of the patient's questions were answered with apparent satisfaction. The patient knows to call the clinic with any problems, questions or concerns.   I have spent a total of 30 minutes minutes of face-to-face and non-face-to-face time, preparing to see the patient, performing a medically appropriate examination, counseling and educating the patient, ordering medications/tests/procedures, documenting clinical information in the electronic health record, independently interpreting results and communicating results to the patient, and care coordination.   Johnston Police PA-C Dept of Hematology and Oncology [facility] at [facility] Phone: [phone]

## 2023-12-29 ENCOUNTER — Other Ambulatory Visit: Payer: Self-pay | Admitting: Family Medicine

## 2023-12-29 ENCOUNTER — Inpatient Hospital Stay: Admitting: Physician Assistant

## 2023-12-29 ENCOUNTER — Inpatient Hospital Stay: Attending: Hematology

## 2023-12-29 ENCOUNTER — Inpatient Hospital Stay

## 2023-12-29 ENCOUNTER — Other Ambulatory Visit: Payer: Self-pay | Admitting: Physician Assistant

## 2023-12-29 VITALS — BP 130/53 | HR 72 | Temp 97.9°F | Resp 15 | Ht 59.0 in | Wt 173.0 lb

## 2023-12-29 VITALS — BP 135/48 | HR 77 | Temp 97.6°F | Resp 20

## 2023-12-29 DIAGNOSIS — C8294 Follicular lymphoma, unspecified, lymph nodes of axilla and upper limb: Secondary | ICD-10-CM | POA: Diagnosis not present

## 2023-12-29 DIAGNOSIS — Z5112 Encounter for antineoplastic immunotherapy: Secondary | ICD-10-CM | POA: Diagnosis not present

## 2023-12-29 DIAGNOSIS — Z7189 Other specified counseling: Secondary | ICD-10-CM

## 2023-12-29 DIAGNOSIS — Z79899 Other long term (current) drug therapy: Secondary | ICD-10-CM | POA: Diagnosis not present

## 2023-12-29 DIAGNOSIS — Z87891 Personal history of nicotine dependence: Secondary | ICD-10-CM | POA: Insufficient documentation

## 2023-12-29 LAB — CBC WITH DIFFERENTIAL (CANCER CENTER ONLY)
Abs Immature Granulocytes: 0.06 K/uL (ref 0.00–0.07)
Basophils Absolute: 0.1 K/uL (ref 0.0–0.1)
Basophils Relative: 1 %
Eosinophils Absolute: 0.2 K/uL (ref 0.0–0.5)
Eosinophils Relative: 3 %
HCT: 36.5 % (ref 36.0–46.0)
Hemoglobin: 12.2 g/dL (ref 12.0–15.0)
Immature Granulocytes: 1 %
Lymphocytes Relative: 7 %
Lymphs Abs: 0.6 K/uL — ABNORMAL LOW (ref 0.7–4.0)
MCH: 31.4 pg (ref 26.0–34.0)
MCHC: 33.4 g/dL (ref 30.0–36.0)
MCV: 94.1 fL (ref 80.0–100.0)
Monocytes Absolute: 1.1 K/uL — ABNORMAL HIGH (ref 0.1–1.0)
Monocytes Relative: 14 %
Neutro Abs: 5.7 K/uL (ref 1.7–7.7)
Neutrophils Relative %: 74 %
Platelet Count: 231 K/uL (ref 150–400)
RBC: 3.88 MIL/uL (ref 3.87–5.11)
RDW: 13.6 % (ref 11.5–15.5)
WBC Count: 7.7 K/uL (ref 4.0–10.5)
nRBC: 0 % (ref 0.0–0.2)

## 2023-12-29 LAB — CMP (CANCER CENTER ONLY)
ALT: 11 U/L (ref 0–44)
AST: 16 U/L (ref 15–41)
Albumin: 4.2 g/dL (ref 3.5–5.0)
Alkaline Phosphatase: 45 U/L (ref 38–126)
Anion gap: 8 (ref 5–15)
BUN: 21 mg/dL (ref 8–23)
CO2: 29 mmol/L (ref 22–32)
Calcium: 9.5 mg/dL (ref 8.9–10.3)
Chloride: 102 mmol/L (ref 98–111)
Creatinine: 0.85 mg/dL (ref 0.44–1.00)
GFR, Estimated: >60 mL/min (ref >60)
Glucose, Bld: 114 mg/dL — ABNORMAL HIGH (ref 70–99)
Potassium: 3.8 mmol/L (ref 3.5–5.1)
Sodium: 139 mmol/L (ref 135–145)
Total Bilirubin: 0.3 mg/dL (ref 0.0–1.2)
Total Protein: 6.8 g/dL (ref 6.5–8.1)

## 2023-12-29 LAB — LACTATE DEHYDROGENASE: LDH: 168 U/L (ref 98–192)

## 2023-12-29 MED ORDER — MONTELUKAST SODIUM 10 MG PO TABS
10.0000 mg | ORAL_TABLET | Freq: Once | ORAL | Status: AC
  Administered 2023-12-29: 10 mg via ORAL
  Filled 2023-12-29: qty 1

## 2023-12-29 MED ORDER — SODIUM CHLORIDE 0.9 % IV SOLN
375.0000 mg/m2 | Freq: Once | INTRAVENOUS | Status: AC
  Administered 2023-12-29: 700 mg via INTRAVENOUS
  Filled 2023-12-29: qty 50

## 2023-12-29 MED ORDER — FAMOTIDINE IN NACL 20-0.9 MG/50ML-% IV SOLN
20.0000 mg | Freq: Once | INTRAVENOUS | Status: AC
  Administered 2023-12-29: 20 mg via INTRAVENOUS
  Filled 2023-12-29: qty 50

## 2023-12-29 MED ORDER — SODIUM CHLORIDE 0.9 % IV SOLN: Freq: Once | INTRAVENOUS | Status: AC

## 2023-12-29 MED ORDER — CETIRIZINE HCL 10 MG/ML IV SOLN
10.0000 mg | Freq: Once | INTRAVENOUS | Status: AC
  Administered 2023-12-29: 10 mg via INTRAVENOUS
  Filled 2023-12-29: qty 1

## 2023-12-29 MED ORDER — ACETAMINOPHEN 500 MG PO TABS
1000.0000 mg | ORAL_TABLET | Freq: Once | ORAL | Status: DC
  Filled 2023-12-29: qty 2

## 2023-12-29 MED ORDER — METHYLPREDNISOLONE SODIUM SUCC 125 MG IJ SOLR
125.0000 mg | Freq: Once | INTRAMUSCULAR | Status: AC
  Administered 2023-12-29: 125 mg via INTRAVENOUS
  Filled 2023-12-29: qty 2

## 2023-12-29 NOTE — Progress Notes (Unsigned)
 Anglea Gordner [place] is {Pc accompanied by:5710} Sources of clinical information for visit is/are {Information source:60032}. Nursing assessment for this office visit was reviewed with the patient for accuracy and revision.   Previous Report(s) Reviewed: {Outside review:15817}     12/29/2023    9:40 AM  Depression screen PHQ 2/9  Decreased Interest 0  Down, Depressed, Hopeless 0  PHQ - 2 Score 0   Flowsheet Row Office Visit from 06/27/2023 in [facility] - A Dept Of [place]. [facility] Office Visit from 03/09/2023 in [facility] - A Dept Of [place]. [facility] Office Visit from 01/12/2023 in [facility] - A Dept Of [place] [facility]  Thoughts that you would be better off dead, or of hurting yourself in some way Not at all Not at all Not at all  PHQ-9 Total Score 0 3 0       10/19/2023    9:45 AM 03/09/2023    9:04 AM 01/12/2023    8:31 AM 12/30/2022    3:24 PM 09/08/2022    9:49 AM  Fall Risk   Falls in the past year? 0 1 0 0 0  Number falls in past yr: 0 0 0 0 0  Injury with Fall? 0 1 0 0 0  Risk for fall due to :    No Fall Risks   Follow up    Falls prevention discussed;Education provided;Falls evaluation completed        12/29/2023    9:40 AM 06/27/2023    1:58 PM 03/09/2023    9:04 AM  PHQ9 SCORE ONLY  PHQ-9 Total Score 0 0  3      Data saved with a previous flowsheet row definition    There are no preventive care reminders to display for this patient.  Health Maintenance Due  Topic Date Due   Zoster Vaccines- Shingrix (2 of 2) 11/12/2021   Influenza Vaccine  11/17/2023   COVID-19 Vaccine (5 - 2025-26 season) 12/18/2023   Medicare Annual Wellness (AWV)  12/30/2023      History/P.E. limitations: {exam; limitations ed:60112}  There are no preventive care reminders to display for this patient. There are no preventive care reminders to display for this patient.  Health Maintenance Due  Topic  Date Due   Zoster Vaccines- Shingrix (2 of 2) 11/12/2021   Influenza Vaccine  11/17/2023   COVID-19 Vaccine (5 - 2025-26 season) 12/18/2023   Medicare Annual Wellness (AWV)  12/30/2023     No chief complaint on file.    Discussed the use of AI scribe software for clinical note transcription with the patient, who gave verbal consent to proceed.  History of Present Illness      SDOH Screenings   Food Insecurity: No Food Insecurity (01/11/2023)  Housing: Low Risk  (01/11/2023)  Transportation Needs: No Transportation Needs (01/11/2023)  Utilities: Not At Risk (12/30/2022)  Alcohol Screen: Low Risk  (01/11/2023)  Depression (PHQ2-9): Low Risk  (12/29/2023)  Financial Resource Strain: Low Risk  (01/11/2023)  Physical Activity: Insufficiently Active (01/11/2023)  Social Connections: Moderately Integrated (01/11/2023)  Stress: No Stress Concern Present (01/11/2023)  Tobacco Use: Medium Risk (12/19/2023)  Health Literacy: Adequate Health Literacy (12/30/2022)   --------------------------------------------------------------------------------------------------------------------------------------------- Visit Problem List with Assessment and Plan   Assessment and Plan Assessment & Plan      No problem-specific Assessment & Plan notes found for this encounter.

## 2024-01-01 ENCOUNTER — Ambulatory Visit: Payer: Medicare PPO

## 2024-01-01 VITALS — Ht 59.0 in | Wt 170.0 lb

## 2024-01-01 DIAGNOSIS — Z0000 Encounter for general adult medical examination without abnormal findings: Secondary | ICD-10-CM | POA: Diagnosis not present

## 2024-01-01 NOTE — Progress Notes (Signed)
 Because this visit was a virtual/telehealth visit,  certain criteria was not obtained, such a blood pressure, CBG if applicable, and timed get up and go. Any medications not marked as taking were not mentioned during the medication reconciliation part of the visit. Any vitals not documented were not able to be obtained due to this being a telehealth visit or patient was unable to self-report a recent blood pressure reading due to a lack of equipment at home via telehealth. Vitals that have been documented are verbally provided by the patient.   Subjective:   Heather Andrews is a 83 y.o. who presents for a Medicare Wellness preventive visit.  As a reminder, Annual Wellness Visits don't include a physical exam, and some assessments may be limited, especially if this visit is performed virtually. We may recommend an in-person follow-up visit with your provider if needed.  Visit Complete: Virtual I connected with  Heather Andrews on 01/01/24 by a audio enabled telemedicine application and verified that I am speaking with the correct person using two identifiers.  Patient Location: Home  Provider Location: Home Office  I discussed the limitations of evaluation and management by telemedicine. The patient expressed understanding and agreed to proceed.  Vital Signs: Because this visit was a virtual/telehealth visit, some criteria may be missing or patient reported. Any vitals not documented were not able to be obtained and vitals that have been documented are patient reported.  VideoError- Librarian, academic were attempted between this provider and patient, however failed, due to patient having technical difficulties OR patient did not have access to video capability.  We continued and completed visit with audio only.   Persons Participating in Visit: Patient.  AWV Questionnaire: No: Patient Medicare AWV questionnaire was not completed prior to this visit.  Cardiac  Risk Factors include: advanced age (>70men, >83 women);hypertension;family history of premature cardiovascular disease;obesity (BMI >30kg/m2);sedentary lifestyle     Objective:    Today's Vitals   01/01/24 1504  Weight: 170 lb (77.1 kg)  Height: 4' 11 (1.499 m)  PainSc: 0-No pain   Body mass index is 34.34 kg/m.     01/01/2024    3:09 PM 11/15/2023    9:35 AM 10/19/2023    9:43 AM 04/24/2023    8:11 AM 03/09/2023    9:04 AM 01/12/2023    8:31 AM 12/30/2022    3:24 PM  Advanced Directives  Does Patient Have a Medical Advance Directive? Yes Yes No Yes Yes Yes Yes  Type of Estate agent of [place];Living will Healthcare Power of [place];Living will  Healthcare Power of [place];Living will Living will Living will Healthcare Power of [place];Living will  Does patient want to make changes to medical advance directive? No - Patient declined   No - Patient declined No - Guardian declined No - Guardian declined No - Patient declined  Copy of Healthcare Power of Attorney in Chart? Yes - validated most recent copy scanned in chart (See row information)    Yes - validated most recent copy scanned in chart (See row information) Yes - validated most recent copy scanned in chart (See row information) Yes - validated most recent copy scanned in chart (See row information)  Would patient like information on creating a medical advance directive?   No - Patient declined        Current Medications (verified) Outpatient Encounter Medications as of 01/01/2024  Medication Sig   albuterol  (VENTOLIN  HFA) 108 (90 Base) MCG/ACT inhaler Inhale  2 puffs into the lungs every 6 (six) hours as needed for wheezing or shortness of breath.   calcium  carbonate (OSCAL) 1500 (600 Ca) MG TABS tablet Take 600 mg by mouth 2 (two) times daily.   Cholecalciferol (VITAMIN D3) 125 MCG (5000 UT) CAPS Take 125 mcg by mouth daily.   Cyanocobalamin (VITAMIN B-12 PO) Take 1 tablet by mouth daily.   denosumab   (PROLIA ) 60 MG/ML SOSY injection Inject 60 mg into the skin every 6 (six) months.   diclofenac  Sodium (VOLTAREN ) 1 % GEL Apply 2 g topically 4 (four) times daily.   ELIQUIS  5 MG TABS tablet TAKE 1 TABLET BY MOUTH TWICE A DAY   GEMTESA 75 MG TABS Take 1 tablet by mouth daily.   hydrochlorothiazide  (HYDRODIURIL ) 25 MG tablet TAKE 1 TABLET (25 MG TOTAL) BY MOUTH DAILY.   rosuvastatin  (CRESTOR ) 20 MG tablet TAKE 1 TABLET BY MOUTH EVERY DAY   Tiotropium Bromide-Olodaterol (STIOLTO RESPIMAT ) 2.5-2.5 MCG/ACT AERS INHALE 2 PUFFS BY MOUTH INTO THE LUNGS DAILY   No facility-administered encounter medications on file as of 01/01/2024.    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 Fontaine, OBGYN).   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   Sensorineural hearing loss (SNHL) of both ears    Patient wears biaural hearing aids   Stress incontinence, female    USI   Past Surgical History:  Procedure Laterality Date   BASAL CELL CARCINOMA EXCISION     CATARACT EXTRACTION     Cyst removed from chest     EYE SURGERY     TO CORRECT LAZY EYE   Family History  Problem Relation Age of Onset   Hypertension Mother    Heart disease Paternal Grandmother    Heart disease Paternal Grandfather    Cancer Brother        Leukemia   Social History    Socioeconomic History   Marital status: Widowed    Spouse name: Not on file   Number of children: 3   Years of education: 10   Highest education level: Master's degree (e.g., MA, MS, MEng, MEd, MSW, MBA)  Occupational History   Occupation: Tourist information centre manager    Comment: Retired  Tobacco Use   Smoking status: Former    Current packs/day: 0.50    Average packs/day: 0.5 packs/day for 67.7 years (33.9 ttl pk-yrs)    Types: Cigarettes    Start date: 04/1956   Smokeless tobacco: Never  Vaping Use   Vaping status: Never Used  Substance and Sexual Activity   Alcohol use: Yes    Comment: occassionally   Drug use: No   Sexual activity: Never    Comment: 1st intercourse 83 yo-Fewer than 5 partners  Other Topics Concern   Not on file  Social History Narrative  Widowed, lives alone though a daughter lives next door.   3 children: [name]; [name], [name]   Activities: reading, interacting with her two cats, playing cards, corresponding with others (email, snail mail), needlework, cooking   Patient indicated on Geri Assessment questionnaire that she would want her daughter, [name] to make medical decisions on her behalf should she be unable. [phone]).  The patient gives permission to speak to Ms Romnet about issue around the patient's health.      Patient lives in a townhouse.  13 steps to get into home.  Home has two levels.    Assists Devices in home: Grab Bars   Regular exercise: Yoga once a week   Transportation: Patient drives   Social Drivers of Health   Financial Resource Strain: Low Risk  (01/01/2024)   Overall Financial Resource Strain (CARDIA)    Difficulty of Paying Living Expenses: Not hard at all  Food Insecurity: No Food Insecurity (01/01/2024)   Hunger Vital Sign    Worried About Running Out of Food in the Last Year: Never true    Ran Out of Food in the Last Year: Never true  Transportation Needs: No Transportation Needs (01/01/2024)    PRAPARE - Administrator, Civil Service (Medical): No    Lack of Transportation (Non-Medical): No  Physical Activity: Insufficiently Active (01/01/2024)   Exercise Vital Sign    Days of Exercise per Week: 4 days    Minutes of Exercise per Session: 30 min  Stress: No Stress Concern Present (01/01/2024)   [employer] of Occupational Health - Occupational Stress Questionnaire    Feeling of Stress: Not at all  Social Connections: Moderately Integrated (01/01/2024)   Social Connection and Isolation Panel    Frequency of Communication with Friends and Family: More than three times a week    Frequency of Social Gatherings with Friends and Family: Twice a week    Attends Religious Services: More than 4 times per year    Active Member of [employer] or Organizations: Yes    Attends Banker Meetings: More than 4 times per year    Marital Status: Widowed    Tobacco Counseling Counseling given: Not Answered    Clinical Intake:  Pre-visit preparation completed: Yes  Pain : No/denies pain Pain Score: 0-No pain     BMI - recorded: 34.34 Nutritional Status: BMI > 30  Obese Nutritional Risks: None Diabetes: No  No results found for: HGBA1C   How often do you need to have someone help you when you read instructions, pamphlets, or other written materials from your doctor or pharmacy?: 1 - Never  Interpreter Needed?: No  Information entered by :: Lowry Bala N. Desmin Daleo, LPN.   Activities of Daily Living     01/01/2024    3:09 PM  In your present state of health, do you have any difficulty performing the following activities:  Hearing? 0  Vision? 0  Difficulty concentrating or making decisions? 0  Walking or climbing stairs? 1  Dressing or bathing? 0  Doing errands, shopping? 0  Preparing Food and eating ? N  Using the Toilet? N  In the past six months, have you accidently leaked urine? Y  Comment bladder spasms  Do you have problems with loss of bowel  control? N  Managing your Medications? N  Managing your Finances? N  Housekeeping or managing your Housekeeping? N    Patient Care Team: McDiarmid, Krystal BIRCH, MD as PCP - General (  Family Medicine) [name], MD as Consulting Physician (Hematology) [name], MD as Consulting Physician (Ophthalmology)  I have updated your Care Teams any recent Medical Services you may have received from other providers in the past year.     Assessment:   This is a routine wellness examination for Latanja.  Hearing/Vision screen Hearing Screening - Comments:: Patient wears hearing aids but not in the house. Vision Screening - Comments:: Wears rx glasses - up to date with routine eye exams with Bruckner Octavia, MD.    Goals Addressed             This Visit's Progress    Patient Stated       Lose about 30 pounds. Maintain my health and stay independent. Staying active around my home.       Depression Screen     01/01/2024    3:11 PM 12/29/2023    9:40 AM 10/19/2023    9:45 AM 06/27/2023    1:58 PM 03/09/2023    9:04 AM 01/12/2023    8:31 AM 12/30/2022    3:23 PM  PHQ 2/9 Scores  PHQ - 2 Score 0 0  0 0 0 0  PHQ- 9 Score 0   0 3 0   Exception Documentation   Patient refusal        Fall Risk     01/01/2024    3:09 PM 10/19/2023    9:45 AM 03/09/2023    9:04 AM 01/12/2023    8:31 AM 12/30/2022    3:24 PM  Fall Risk   Falls in the past year? 0 0 1 0 0  Number falls in past yr: 0 0 0 0 0  Injury with Fall? 0 0 1 0 0  Risk for fall due to : No Fall Risks    No Fall Risks  Follow up Falls evaluation completed    Falls prevention discussed;Education provided;Falls evaluation completed    MEDICARE RISK AT HOME:  Medicare Risk at Home Any stairs in or around the home?: Yes If so, are there any without handrails?: No Home free of loose throw rugs in walkways, pet beds, electrical cords, etc?: Yes Adequate lighting in your home to reduce risk of falls?: Yes Life alert?:  No (DAUGHTER LIVES NEXT DOOR) Use of a cane, walker or w/c?: Yes (WALKER) Grab bars in the bathroom?: Yes Shower chair or bench in shower?: Yes (BUILT-IN-SEAT) Elevated toilet seat or a handicapped toilet?: No  TIMED UP AND GO:  Was the test performed?  No  Cognitive Function: Declined/Normal: No cognitive concerns noted by patient or family. Patient alert, oriented, able to answer questions appropriately and recall recent events. No signs of memory loss or confusion.    01/01/2024    3:11 PM  MMSE - Mini Mental State Exam  Not completed: Unable to complete      03/19/2021   12:54 PM  Montreal Cognitive Assessment   Visuospatial/ Executive (0/5) 5  Naming (0/3) 3  Attention: Read list of digits (0/2) 2  Attention: Read list of letters (0/1) 1  Attention: Serial 7 subtraction starting at 100 (0/3) 3  Language: Repeat phrase (0/2) 2  Language : Fluency (0/1) 0  Abstraction (0/2) 2  Delayed Recall (0/5) 5  Orientation (0/6) 6  Total 29  Adjusted Score (based on education) 29      01/01/2024    3:18 PM 12/30/2022    3:24 PM  6CIT Screen  What Year? 0 points 0  points  What month? 0 points 0 points  What time? 0 points 0 points  Count back from 20 0 points 0 points  Months in reverse 0 points 2 points  Repeat phrase 0 points 2 points  Total Score 0 points 4 points    Immunizations Immunization History  Administered Date(s) Administered    sv, Bivalent, Protein Subunit Rsvpref,pf (Abrysvo) 10/09/2023   Fluad Quad(high Dose 65+) 03/18/2021, 02/03/2022   Fluad Trivalent(High Dose 65+) 01/24/2023   Fluzone Influenza virus vaccine,trivalent (IIV3), split virus 01/17/2013   Influenza,inj,Quad PF,6+ Mos 02/26/2016, 04/03/2017, 04/25/2018, 01/25/2019   Influenza,inj,quad, With Preservative 01/09/2014, 02/19/2015, 01/16/2017   PFIZER(Purple Top)SARS-COV-2 Vaccination 06/16/2019, 07/10/2019, 02/01/2020   Pfizer Covid-19 Vaccine Bivalent Booster 57yrs & up 03/18/2021    Pneumococcal Conjugate-13 02/13/2014   Pneumococcal Polysaccharide-23 07/27/2006   Td 05/12/2016   Td (Adult) 05/12/2016   Zoster Recombinant(Shingrix) 07/01/2021, 09/17/2021    Screening Tests Health Maintenance  Topic Date Due   Influenza Vaccine  11/17/2023   COVID-19 Vaccine (5 - 2025-26 season) 12/18/2023   Medicare Annual Wellness (AWV)  12/31/2024   DTaP/Tdap/Td (3 - Tdap) 05/12/2026   Pneumococcal Vaccine: 50+ Years  Completed   DEXA SCAN  Completed   Zoster Vaccines- Shingrix  Completed   HPV VACCINES  Aged Out   Meningococcal B Vaccine  Aged Out   Lung Cancer Screening  Discontinued   Hepatitis C Screening  Discontinued    Health Maintenance Items Addressed: Yes Patient aware of current care gaps.  Immunization record was verified by [facility] and updated in patient's chart.  Additional Screening:  Vision Screening: Recommended annual ophthalmology exams for early detection of glaucoma and other disorders of the eye. Is the patient up to date with their annual eye exam?  Yes  Who is the provider or what is the name of the office in which the patient attends annual eye exams? Lonni Gaudy, MD.  Dental Screening: Recommended annual dental exams for proper oral hygiene  Community Resource Referral / Chronic Care Management: CRR required this visit?  No   CCM required this visit?  No   Plan:    I have personally reviewed and noted the following in the patient's chart:   Medical and social history Use of alcohol, tobacco or illicit drugs  Current medications and supplements including opioid prescriptions. Patient is not currently taking opioid prescriptions. Functional ability and status Nutritional status Physical activity Advanced directives List of other physicians Hospitalizations, surgeries, and ER visits in previous 12 months Vitals Screenings to include cognitive, depression, and falls Referrals and appointments  In addition, I have reviewed and  discussed with patient certain preventive protocols, quality metrics, and best practice recommendations. A written personalized care plan for preventive services as well as general preventive health recommendations were provided to patient.   Roz LOISE Fuller, LPN   [id number]   After Visit Summary: (MyChart) Due to this being a telephonic visit, the after visit summary with patients personalized plan was offered to patient via MyChart   Notes: Nothing significant to report at this time.

## 2024-01-01 NOTE — Patient Instructions (Signed)
 Heather Andrews,  Thank you for taking the time for your Medicare Wellness Visit. I appreciate your continued commitment to your health goals. Please review the care plan we discussed, and feel free to reach out if I can assist you further.  Medicare recommends these wellness visits once per year to help you and your care team stay ahead of potential health issues. These visits are designed to focus on prevention, allowing your provider to concentrate on managing your acute and chronic conditions during your regular appointments.  Please note that Annual Wellness Visits do not include a physical exam. Some assessments may be limited, especially if the visit was conducted virtually. If needed, we may recommend a separate in-person follow-up with your provider.  Ongoing Care Seeing your primary care provider every 3 to 6 months helps us  monitor your health and provide consistent, personalized care.   Referrals If a referral was made during today's visit and you haven't received any updates within two weeks, please contact the referred provider directly to check on the status.  Recommended Screenings:  Health Maintenance  Topic Date Due   Flu Shot  11/17/2023   COVID-19 Vaccine (5 - 2025-26 season) 12/18/2023   Medicare Annual Wellness Visit  12/31/2024   DTaP/Tdap/Td vaccine (3 - Tdap) 05/12/2026   Pneumococcal Vaccine for age over 26  Completed   DEXA scan (bone density measurement)  Completed   Zoster (Shingles) Vaccine  Completed   HPV Vaccine  Aged Out   Meningitis B Vaccine  Aged Out   Screening for Lung Cancer  Discontinued   Hepatitis C Screening  Discontinued       01/01/2024    3:09 PM  Advanced Directives  Does Patient Have a Medical Advance Directive? Yes  Type of Estate agent of [place];Living will  Does patient want to make changes to medical advance directive? No - Patient declined  Copy of Healthcare Power of Attorney in Chart? Yes - validated most  recent copy scanned in chart (See row information)   Advance Care Planning is important because it: Ensures you receive medical care that aligns with your values, goals, and preferences. Provides guidance to your family and loved ones, reducing the emotional burden of decision-making during critical moments.  Vision: Annual vision screenings are recommended for early detection of glaucoma, cataracts, and diabetic retinopathy. These exams can also reveal signs of chronic conditions such as diabetes and high blood pressure.  Dental: Annual dental screenings help detect early signs of oral cancer, gum disease, and other conditions linked to overall health, including heart disease and diabetes.  Please see the attached documents for additional preventive care recommendations.

## 2024-01-02 ENCOUNTER — Other Ambulatory Visit: Payer: Self-pay

## 2024-01-02 ENCOUNTER — Encounter

## 2024-01-04 ENCOUNTER — Encounter

## 2024-01-19 DIAGNOSIS — Z961 Presence of intraocular lens: Secondary | ICD-10-CM | POA: Diagnosis not present

## 2024-01-19 DIAGNOSIS — H43813 Vitreous degeneration, bilateral: Secondary | ICD-10-CM | POA: Diagnosis not present

## 2024-01-19 DIAGNOSIS — H35371 Puckering of macula, right eye: Secondary | ICD-10-CM | POA: Diagnosis not present

## 2024-01-19 DIAGNOSIS — H31091 Other chorioretinal scars, right eye: Secondary | ICD-10-CM | POA: Diagnosis not present

## 2024-01-19 DIAGNOSIS — H53032 Strabismic amblyopia, left eye: Secondary | ICD-10-CM | POA: Diagnosis not present

## 2024-01-19 DIAGNOSIS — H353211 Exudative age-related macular degeneration, right eye, with active choroidal neovascularization: Secondary | ICD-10-CM | POA: Diagnosis not present

## 2024-01-19 DIAGNOSIS — H353122 Nonexudative age-related macular degeneration, left eye, intermediate dry stage: Secondary | ICD-10-CM | POA: Diagnosis not present

## 2024-02-28 ENCOUNTER — Other Ambulatory Visit: Payer: Self-pay

## 2024-02-28 DIAGNOSIS — C8294 Follicular lymphoma, unspecified, lymph nodes of axilla and upper limb: Secondary | ICD-10-CM

## 2024-02-29 DIAGNOSIS — R351 Nocturia: Secondary | ICD-10-CM | POA: Diagnosis not present

## 2024-02-29 DIAGNOSIS — N3281 Overactive bladder: Secondary | ICD-10-CM | POA: Diagnosis not present

## 2024-02-29 DIAGNOSIS — N390 Urinary tract infection, site not specified: Secondary | ICD-10-CM | POA: Diagnosis not present

## 2024-02-29 DIAGNOSIS — R350 Frequency of micturition: Secondary | ICD-10-CM | POA: Diagnosis not present

## 2024-03-01 ENCOUNTER — Encounter: Payer: Self-pay | Admitting: Hematology

## 2024-03-01 ENCOUNTER — Inpatient Hospital Stay: Attending: Hematology

## 2024-03-01 ENCOUNTER — Inpatient Hospital Stay: Admitting: Hematology

## 2024-03-01 ENCOUNTER — Inpatient Hospital Stay

## 2024-03-01 VITALS — BP 126/44 | HR 82 | Temp 97.9°F | Resp 18

## 2024-03-01 VITALS — BP 128/53 | HR 78 | Temp 97.6°F | Resp 17 | Ht 59.0 in | Wt 169.0 lb

## 2024-03-01 DIAGNOSIS — C8294 Follicular lymphoma, unspecified, lymph nodes of axilla and upper limb: Secondary | ICD-10-CM

## 2024-03-01 DIAGNOSIS — Z7189 Other specified counseling: Secondary | ICD-10-CM

## 2024-03-01 DIAGNOSIS — Z79899 Other long term (current) drug therapy: Secondary | ICD-10-CM | POA: Insufficient documentation

## 2024-03-01 DIAGNOSIS — Z5112 Encounter for antineoplastic immunotherapy: Secondary | ICD-10-CM

## 2024-03-01 LAB — LACTATE DEHYDROGENASE: LDH: 184 U/L (ref 105–235)

## 2024-03-01 LAB — CBC WITH DIFFERENTIAL (CANCER CENTER ONLY)
Abs Immature Granulocytes: 0.03 K/uL (ref 0.00–0.07)
Basophils Absolute: 0.1 K/uL (ref 0.0–0.1)
Basophils Relative: 2 %
Eosinophils Absolute: 0.2 K/uL (ref 0.0–0.5)
Eosinophils Relative: 2 %
HCT: 35.6 % — ABNORMAL LOW (ref 36.0–46.0)
Hemoglobin: 11.7 g/dL — ABNORMAL LOW (ref 12.0–15.0)
Immature Granulocytes: 0 %
Lymphocytes Relative: 7 %
Lymphs Abs: 0.6 K/uL — ABNORMAL LOW (ref 0.7–4.0)
MCH: 31 pg (ref 26.0–34.0)
MCHC: 32.9 g/dL (ref 30.0–36.0)
MCV: 94.4 fL (ref 80.0–100.0)
Monocytes Absolute: 0.9 K/uL (ref 0.1–1.0)
Monocytes Relative: 12 %
Neutro Abs: 6.1 K/uL (ref 1.7–7.7)
Neutrophils Relative %: 77 %
Platelet Count: 213 K/uL (ref 150–400)
RBC: 3.77 MIL/uL — ABNORMAL LOW (ref 3.87–5.11)
RDW: 13.7 % (ref 11.5–15.5)
WBC Count: 7.9 K/uL (ref 4.0–10.5)
nRBC: 0 % (ref 0.0–0.2)

## 2024-03-01 LAB — CMP (CANCER CENTER ONLY)
ALT: 8 U/L (ref 0–44)
AST: 14 U/L — ABNORMAL LOW (ref 15–41)
Albumin: 4.2 g/dL (ref 3.5–5.0)
Alkaline Phosphatase: 42 U/L (ref 38–126)
Anion gap: 7 (ref 5–15)
BUN: 24 mg/dL — ABNORMAL HIGH (ref 8–23)
CO2: 30 mmol/L (ref 22–32)
Calcium: 9.8 mg/dL (ref 8.9–10.3)
Chloride: 102 mmol/L (ref 98–111)
Creatinine: 0.89 mg/dL (ref 0.44–1.00)
GFR, Estimated: >60 mL/min (ref >60)
Glucose, Bld: 118 mg/dL — ABNORMAL HIGH (ref 70–99)
Potassium: 3.9 mmol/L (ref 3.5–5.1)
Sodium: 139 mmol/L (ref 135–145)
Total Bilirubin: 0.3 mg/dL (ref 0.0–1.2)
Total Protein: 6.8 g/dL (ref 6.5–8.1)

## 2024-03-01 MED ORDER — FAMOTIDINE IN NACL 20-0.9 MG/50ML-% IV SOLN
20.0000 mg | Freq: Once | INTRAVENOUS | Status: AC
  Administered 2024-03-01: 20 mg via INTRAVENOUS
  Filled 2024-03-01: qty 50

## 2024-03-01 MED ORDER — SODIUM CHLORIDE 0.9 % IV SOLN
375.0000 mg/m2 | Freq: Once | INTRAVENOUS | Status: AC
  Administered 2024-03-01: 700 mg via INTRAVENOUS
  Filled 2024-03-01: qty 50

## 2024-03-01 MED ORDER — ACETAMINOPHEN 500 MG PO TABS
1000.0000 mg | ORAL_TABLET | Freq: Once | ORAL | Status: AC
  Administered 2024-03-01: 1000 mg via ORAL
  Filled 2024-03-01: qty 2

## 2024-03-01 MED ORDER — METHYLPREDNISOLONE SODIUM SUCC 125 MG IJ SOLR
125.0000 mg | Freq: Once | INTRAMUSCULAR | Status: AC
  Administered 2024-03-01: 125 mg via INTRAVENOUS
  Filled 2024-03-01: qty 2

## 2024-03-01 MED ORDER — CETIRIZINE HCL 10 MG/ML IV SOLN
10.0000 mg | Freq: Once | INTRAVENOUS | Status: AC
  Administered 2024-03-01: 10 mg via INTRAVENOUS
  Filled 2024-03-01: qty 1

## 2024-03-01 MED ORDER — SODIUM CHLORIDE 0.9 % IV SOLN: Freq: Once | INTRAVENOUS | Status: AC

## 2024-03-01 MED ORDER — MONTELUKAST SODIUM 10 MG PO TABS
10.0000 mg | ORAL_TABLET | Freq: Once | ORAL | Status: AC
  Administered 2024-03-01: 10 mg via ORAL
  Filled 2024-03-01: qty 1

## 2024-03-01 NOTE — Patient Instructions (Signed)
 [facility] - A DEPT OF [facility]  Discharge Instructions: Thank you for choosing [facility] to provide your oncology and hematology care.   If you have a lab appointment with the Cancer Center, please go directly to the Cancer Center and check in at the registration area.   Wear comfortable clothing and clothing appropriate for easy access to any Portacath or PICC line.   We strive to give you quality time with your provider. You may need to reschedule your appointment if you arrive late (15 or more minutes).  Arriving late affects you and other patients whose appointments are after yours.  Also, if you miss three or more appointments without notifying the office, you may be dismissed from the clinic at the provider's discretion.      For prescription refill requests, have your pharmacy contact our office and allow 72 hours for refills to be completed.    Today you received the following chemotherapy and/or immunotherapy agents: riTUXimab -pvvr (RUXIENCE )     To help prevent nausea and vomiting after your treatment, we encourage you to take your nausea medication as directed.  BELOW ARE SYMPTOMS THAT SHOULD BE REPORTED IMMEDIATELY: *FEVER GREATER THAN 100.4 F (38 C) OR HIGHER *CHILLS OR SWEATING *NAUSEA AND VOMITING THAT IS NOT CONTROLLED WITH YOUR NAUSEA MEDICATION *UNUSUAL SHORTNESS OF BREATH *UNUSUAL BRUISING OR BLEEDING *URINARY PROBLEMS (pain or burning when urinating, or frequent urination) *BOWEL PROBLEMS (unusual diarrhea, constipation, pain near the anus) TENDERNESS IN MOUTH AND THROAT WITH OR WITHOUT PRESENCE OF ULCERS (sore throat, sores in mouth, or a toothache) UNUSUAL RASH, SWELLING OR PAIN  UNUSUAL VAGINAL DISCHARGE OR ITCHING   Items with * indicate a potential emergency and should be followed up as soon as possible or go to the Emergency Department if any problems should occur.  Please show the CHEMOTHERAPY ALERT CARD or  IMMUNOTHERAPY ALERT CARD at check-in to the Emergency Department and triage nurse.  Should you have questions after your visit or need to cancel or reschedule your appointment, please contact [facility] - A [facility] OF [facility]  Dept: [phone]  and follow the prompts.  Office hours are 8:00 a.m. to 4:30 p.m. Monday - Friday. Please note that voicemails left after 4:00 p.m. may not be returned until the following business day.  We are closed weekends and major holidays. You have access to a nurse at all times for urgent questions. Please call the main number to the clinic Dept: [phone] and follow the prompts.   For any non-urgent questions, you may also contact your provider using MyChart. We now offer e-Visits for anyone 65 and older to request care online for non-urgent symptoms. For details visit [email or web address].   Also download the MyChart app! Go to the app store, search "MyChart", open the app, select [facility], and log in with your MyChart username and password.

## 2024-03-01 NOTE — Progress Notes (Signed)
 HEMATOLOGY ONCOLOGY PROGRESS NOTE  Date of service: 03/01/2024  Patient Care Team: McDiarmid, Krystal BIRCH, MD as PCP - General (Family Medicine) [name], MD as Consulting Physician (Hematology) [name], MD as Consulting Physician (Ophthalmology)  CHIEF COMPLAINT/PURPOSE OF CONSULTATION: Follow-up for continued evaluation and management of follicular lymphoma.  HISTORY OF PRESENTING ILLNESS:  Heather Andrews is a wonderful 83 y.o. female who has been referred to us  by [name], MD for evaluation and management of newly-diagnosed low-grade follicular lymphoma.    Today, she is accompanied by her daughter. She reports that she has had right LE edema for about 6 months. Upon examination, patient has bilateral LE edema, right more than left.    She also reports having a rash at one point. Patient denies any medication changes, long distance travel, or unusual activity. Patient denies any redness/pain in the area.  Patient denies back pain, recent infections, abdominal pain, change in bowel habits, fever, chills, or night sweats.   She does report that she tripped and suffered a fall in January 2024, causing her to hit the side of her head.   Patient denies any breast discomfort or swelling besides in her LE. She does however sometimes experience tingling in her hands.   Patient does confirm having a silent stroke at some point previously, but is unsure of when it occurred.    Patient does report that she is slowing down in general, which she attributes to her age. Patient denies any sudden new fatigue or other major limiting medical issues at this time. Patient does regualrly take vitamin D , but is unsure of how many units she takes. She also takes vitamin K. Patient denies any concern for varicose veins in the past. Patient reports that she will be traveling to [facility] for a week and will leave May 4th.    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 female who is here today for continued evaluation and management of follicular lymphoma. She is accompanied by her daughter and is ambulating with a wheel chair.  she was last seen by me on 10/31/2023; at the time she mentioned experiencing drenching night sweats once in while, lasting 2-3 nights at a time. She reports that she continues to have an enlarged lymph node in the groin which has decreased in size. Patient reports noticing what she believed to be a new left-sided cervical lump, however this was noted to be her muscle based on physical examination.     Today, she reports she is feeling fine and has no new concerns. She reports her energy levels have been steady. She has recovered from a 24 hour stomach virus this past week, 02/23/2024.  She is tolerating the Rituxan  infusions well. She denies any new infection issues, fevers/chills, drenching night sweats, abdominal pain, new lumps/bumps, leg swelling, diarrhea, or bowel/urinary changes.  Ms. Buch will be spending the holidays at home with her two daughters.  REVIEW OF SYSTEMS:   10 Point review of systems of done and is negative except as noted above.  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 Fontaine, OBGYN).   Recurrent major depression in full remission 03/18/2021   Sensorineural hearing loss (SNHL) of both ears    Patient wears biaural hearing aids   Stress incontinence, female    USI    SURGICAL HISTORY Past Surgical History:  Procedure Laterality Date   BASAL CELL CARCINOMA EXCISION     CATARACT EXTRACTION     Cyst removed from chest     EYE SURGERY     TO CORRECT LAZY EYE    SOCIAL HISTORY Social History   Tobacco Use   Smoking status: Former    Current packs/day: 0.50    Average packs/day: 0.5 packs/day for 67.9 years (33.9 ttl pk-yrs)    Types: Cigarettes    Start date: 04/1956   Smokeless tobacco: Never  Vaping Use   Vaping status: Never Used  Substance Use Topics   Alcohol use: Yes    Comment: occassionally   Drug use: No    Social History   Social History Narrative   Widowed, lives alone though a daughter lives next door.   3 children: [name]; [name], [name]   Activities: reading, interacting with her two cats, playing cards, corresponding with others (email, snail mail), needlework, cooking   Patient indicated on Geri Assessment questionnaire that she would want her daughter, [name] to make medical decisions on her behalf should she be unable. [phone]).  The patient gives permission to speak to Ms Romnet about issue around the patient's health.      Patient lives in a townhouse.  13 steps to get into home.  Home has two levels.    Assists Devices in home: Grab Bars   Regular exercise: Yoga once a week   Transportation: Patient drives    SOCIAL DRIVERS OF HEALTH SDOH Screenings   Food Insecurity: No Food Insecurity (01/01/2024)  Housing: Low Risk  (01/01/2024)  Transportation Needs: No Transportation Needs (01/01/2024)  Utilities: Not At Risk (01/01/2024)  Alcohol Screen: Low Risk   (01/01/2024)  Depression (PHQ2-9): Low Risk  (03/01/2024)  Financial Resource Strain: Low Risk  (01/01/2024)  Physical Activity: Insufficiently Active (01/01/2024)  Social Connections: Moderately Integrated (01/01/2024)  Stress: No Stress Concern Present (01/01/2024)  Tobacco Use: Medium Risk (01/02/2024)  Health Literacy: Adequate Health Literacy (01/01/2024)     FAMILY HISTORY Family History  Problem Relation Age of Onset   Hypertension Mother    Heart disease Paternal Grandmother    Heart disease Paternal Grandfather    Cancer Brother        Leukemia     ALLERGIES: is allergic to lisinopril, ephedrine-guaifenesin, and rituximab .  MEDICATIONS  Current Outpatient Medications  Medication Sig Dispense Refill   albuterol  (VENTOLIN  HFA) 108 (90 Base) MCG/ACT inhaler Inhale 2 puffs into the lungs every 6 (six) hours as needed for wheezing or shortness of breath. 18 g 5   calcium  carbonate (OSCAL) 1500 (600 Ca) MG TABS tablet Take 600  mg by mouth 2 (two) times daily.     Cholecalciferol (VITAMIN D3) 125 MCG (5000 UT) CAPS Take 125 mcg by mouth daily.     Cyanocobalamin (VITAMIN B-12 PO) Take 1 tablet by mouth daily.     denosumab  (PROLIA ) 60 MG/ML SOSY injection Inject 60 mg into the skin every 6 (six) months.     diclofenac  Sodium (VOLTAREN ) 1 % GEL Apply 2 g topically 4 (four) times daily. 50 g 2   ELIQUIS  5 MG TABS tablet TAKE 1 TABLET BY MOUTH TWICE A DAY 60 tablet 4   GEMTESA 75 MG TABS Take 1 tablet by mouth daily.     hydrochlorothiazide  (HYDRODIURIL ) 25 MG tablet TAKE 1 TABLET (25 MG TOTAL) BY MOUTH DAILY. 90 tablet 3   rosuvastatin  (CRESTOR ) 20 MG tablet TAKE 1 TABLET BY MOUTH EVERY DAY 90 tablet 3   Tiotropium Bromide-Olodaterol (STIOLTO RESPIMAT ) 2.5-2.5 MCG/ACT AERS INHALE 2 PUFFS BY MOUTH INTO THE LUNGS DAILY 4 g 11   No current facility-administered medications for this visit.    PHYSICAL EXAMINATION: ECOG PERFORMANCE STATUS: 1 - Symptomatic but completely  ambulatory VITALS: Vitals:   03/01/24 1123 03/01/24 1126  BP: (!) 142/51 (!) 128/53  Pulse: 78   Resp: 17   Temp: 97.6 F (36.4 C)   SpO2: 96%    Filed Weights   03/01/24 1123  Weight: 169 lb (76.7 kg)   Body mass index is 34.13 kg/m.  GENERAL: alert, in no acute distress and comfortable SKIN: no acute rashes, no significant lesions EYES: conjunctiva are pink and non-injected, sclera anicteric OROPHARYNX: MMM, no exudates, no oropharyngeal erythema or ulceration NECK: supple, no JVD LYMPH:  no palpable lymphadenopathy in the cervical, axillary or inguinal regions [+] small lymph node enlargement, but not notable LUNGS: clear to auscultation b/l with normal respiratory effort HEART: regular rate & rhythm ABDOMEN:  normoactive bowel sounds , non tender, not distended, no hepatosplenomegaly Extremity: no pedal edema PSYCH: alert & oriented x 3 with fluent speech NEURO: no focal motor/sensory deficits  LABORATORY DATA:   I have reviewed the data as listed     Latest Ref Rng & Units 03/01/2024   11:05 AM 12/29/2023    8:43 AM 10/31/2023    9:24 AM  CBC EXTENDED  WBC 4.0 - 10.5 K/uL 7.9  7.7  7.2   RBC 3.87 - 5.11 MIL/uL 3.77  3.88  3.72   Hemoglobin 12.0 - 15.0 g/dL 88.2  87.7  88.1   HCT 36.0 - 46.0 % 35.6  36.5  34.9   Platelets 150 - 400 K/uL 213  231  226   NEUT# 1.7 - 7.7 K/uL 6.1  5.7  5.4   Lymph# 0.7 - 4.0 K/uL 0.6  0.6  0.6          Latest Ref Rng & Units 03/01/2024   11:05 AM 12/29/2023    8:43 AM 11/01/2023    9:33 AM  CMP  Glucose 70 - 99 mg/dL 881  885  863   BUN 8 - 23 mg/dL 24  21  23    Creatinine 0.44 - 1.00 mg/dL 9.10  9.14  9.32   Sodium 135 - 145 mmol/L 139  139  141   Potassium 3.5 - 5.1 mmol/L 3.9  3.8  4.1   Chloride 98 - 111 mmol/L 102  102  102   CO2 22 - 32 mmol/L 30  29  28    Calcium  8.9 - 10.3 mg/dL 9.8  9.5  9.5   Total Protein 6.5 - 8.1 g/dL 6.8  6.8    Total Bilirubin 0.0 - 1.2 mg/dL 0.3  0.3    Alkaline Phos 38 - 126 U/L 42  45     AST 15 - 41 U/L 14  16    ALT 0 - 44 U/L 8  11      Needle core biopsy 07/27/2022:    Breast Ultrasound 07/20/2022:  Ultrasound guided biopsy:  Ultrasound guided biopsy:           RADIOGRAPHIC STUDIES: I have personally reviewed the radiological images as listed and agreed with the findings in the report. No results found.   ASSESSMENT & PLAN:  83 y.o. female with  Stage IV low grade follicular lymphoma incidentally noted LNadenopathy on routine mammogram. History of asymptomatic stroke  Hypertension  Pure hypercholesterolemia  Contusion of right shoulder region  6.  Hx of Acute DVT of left peroneal veins  PLAN: - Discussed lab results on 03/01/2024 in detail with patient: -Blood counts are back and stable -Blood chemistry is pending -Planning to get a PET/CT scan early next year, January or February 2026 -no notable toxicities from maintenance Rituximab  at this time.and we shall continue this. Treatment orders reviewed and signed. -RTC for next dose of maintenance Rituxan  in 60 days with labs and MD visit  PET/CT in feb 2026   FOLLOW-UP in 3 months for labs and follow-up with Dr. [name].  .The total time spent in the appointment was 30 minutes* .  All of the patient's questions were answered with apparent satisfaction. The patient knows to call the clinic with any problems, questions or concerns.   [name] [name] MD MS AAHIVMS [facility] Hematology/Oncology Physician [facility]  .*Total Encounter Time as defined by the Centers for Medicare and Medicaid Services includes, in addition to the face-to-face time of a patient visit (documented in the note above) non-face-to-face time: obtaining and reviewing outside history, ordering and reviewing medications, tests or procedures, care coordination (communications with other health care professionals or caregivers) and documentation in the medical record.  I, Alan Blowers, acting as a neurosurgeon for [name] [name], MD.,have documented all relevant documentation on the behalf of [name] [name], MD,as directed by  [name] [name], MD while in the presence of [name] [name], MD.  I have reviewed the above documentation for accuracy and completeness, and I agree with the above.  Hillery Bhalla, MD

## 2024-03-07 NOTE — Progress Notes (Incomplete)
 HEMATOLOGY ONCOLOGY PROGRESS NOTE  Date of service: 03/01/2024  Patient Care Team: McDiarmid, Krystal BIRCH, MD as PCP - General (Family Medicine) [name], MD as Consulting Physician (Hematology) [name], MD as Consulting Physician (Ophthalmology)  CHIEF COMPLAINT/PURPOSE OF CONSULTATION: Follow-up for continued evaluation and management of follicular lymphoma.  HISTORY OF PRESENTING ILLNESS:  Heather Andrews is a wonderful 83 y.o. female who has been referred to us  by [name], MD for evaluation and management of newly-diagnosed low-grade follicular lymphoma.    Today, she is accompanied by her daughter. She reports that she has had right LE edema for about 6 months. Upon examination, patient has bilateral LE edema, right more than left.    She also reports having a rash at one point. Patient denies any medication changes, long distance travel, or unusual activity. Patient denies any redness/pain in the area.  Patient denies back pain, recent infections, abdominal pain, change in bowel habits, fever, chills, or night sweats.   She does report that she tripped and suffered a fall in January 2024, causing her to hit the side of her head.   Patient denies any breast discomfort or swelling besides in her LE. She does however sometimes experience tingling in her hands.   Patient does confirm having a silent stroke at some point previously, but is unsure of when it occurred.    Patient does report that she is slowing down in general, which she attributes to her age. Patient denies any sudden new fatigue or other major limiting medical issues at this time. Patient does regualrly take vitamin D , but is unsure of how many units she takes. She also takes vitamin K. Patient denies any concern for varicose veins in the past. Patient reports that she will be traveling to [place] for a week and will leave May 4th.    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 female who is here today for continued evaluation and management of follicular lymphoma. She is accompanied by her daughter and is ambulating with a wheel chair.  she was last seen by me on 10/31/2023; at the time she mentioned experiencing drenching night sweats once in while, lasting 2-3 nights at a time. She reports that she continues to have an enlarged lymph node in the groin which has decreased in size. Patient reports noticing what she believed to be a new left-sided cervical lump, however this was noted to be her muscle based on physical examination.     Today, she reports she is feeling fine and has no new concerns. She reports her energy levels have been steady. She has recovered from a 24 hour stomach virus this past week, 02/23/2024.  She is tolerating the Rituxan  infusions well. She denies any new infection issues, fevers/chills, drenching night sweats, abdominal pain, new lumps/bumps, leg swelling, diarrhea, or bowel/urinary changes.  Ms. Wollenberg will be spending the holidays at home with her two daughters.  REVIEW OF SYSTEMS:   10 Point review of systems of done and is negative except as noted above.  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 Fontaine, OBGYN).   Recurrent major depression in full remission 03/18/2021   Sensorineural hearing loss (SNHL) of both ears    Patient wears biaural hearing aids   Stress incontinence, female    USI    SURGICAL HISTORY Past Surgical History:  Procedure Laterality Date   BASAL CELL CARCINOMA EXCISION     CATARACT EXTRACTION     Cyst removed from chest     EYE SURGERY     TO CORRECT LAZY EYE    SOCIAL HISTORY Social History   Tobacco Use   Smoking status: Former    Current packs/day: 0.50    Average packs/day: 0.5 packs/day for 67.9 years (33.9 ttl pk-yrs)    Types: Cigarettes    Start date: 04/1956   Smokeless tobacco: Never  Vaping Use   Vaping status: Never Used  Substance Use Topics   Alcohol use: Yes    Comment: occassionally   Drug use: No    Social History   Social History Narrative   Widowed, lives alone though a daughter lives next door.   3 children: [name]; [name], [name]   Activities: reading, interacting with her two cats, playing cards, corresponding with others (email, snail mail), needlework, cooking   Patient indicated on Geri Assessment questionnaire that she would want her daughter, [name] to make medical decisions on her behalf should she be unable. [phone]).  The patient gives permission to speak to Ms Romnet about issue around the patient's health.      Patient lives in a townhouse.  13 steps to get into home.  Home has two levels.    Assists Devices in home: Grab Bars   Regular exercise: Yoga once a week   Transportation: Patient drives    SOCIAL DRIVERS OF HEALTH SDOH Screenings   Food Insecurity: No Food Insecurity (01/01/2024)  Housing: Low Risk  (01/01/2024)  Transportation Needs: No Transportation Needs (01/01/2024)  Utilities: Not At Risk (01/01/2024)  Alcohol Screen: Low Risk   (01/01/2024)  Depression (PHQ2-9): Low Risk  (03/01/2024)  Financial Resource Strain: Low Risk  (01/01/2024)  Physical Activity: Insufficiently Active (01/01/2024)  Social Connections: Moderately Integrated (01/01/2024)  Stress: No Stress Concern Present (01/01/2024)  Tobacco Use: Medium Risk (01/02/2024)  Health Literacy: Adequate Health Literacy (01/01/2024)     FAMILY HISTORY Family History  Problem Relation Age of Onset   Hypertension Mother    Heart disease Paternal Grandmother    Heart disease Paternal Grandfather    Cancer Brother        Leukemia     ALLERGIES: is allergic to lisinopril, ephedrine-guaifenesin, and rituximab .  MEDICATIONS  Current Outpatient Medications  Medication Sig Dispense Refill   albuterol  (VENTOLIN  HFA) 108 (90 Base) MCG/ACT inhaler Inhale 2 puffs into the lungs every 6 (six) hours as needed for wheezing or shortness of breath. 18 g 5   calcium  carbonate (OSCAL) 1500 (600 Ca) MG TABS tablet Take 600 mg  by mouth 2 (two) times daily.     Cholecalciferol (VITAMIN D3) 125 MCG (5000 UT) CAPS Take 125 mcg by mouth daily.     Cyanocobalamin (VITAMIN B-12 PO) Take 1 tablet by mouth daily.     denosumab  (PROLIA ) 60 MG/ML SOSY injection Inject 60 mg into the skin every 6 (six) months.     diclofenac  Sodium (VOLTAREN ) 1 % GEL Apply 2 g topically 4 (four) times daily. 50 g 2   ELIQUIS  5 MG TABS tablet TAKE 1 TABLET BY MOUTH TWICE A DAY 60 tablet 4   GEMTESA 75 MG TABS Take 1 tablet by mouth daily.     hydrochlorothiazide  (HYDRODIURIL ) 25 MG tablet TAKE 1 TABLET (25 MG TOTAL) BY MOUTH DAILY. 90 tablet 3   rosuvastatin  (CRESTOR ) 20 MG tablet TAKE 1 TABLET BY MOUTH EVERY DAY 90 tablet 3   Tiotropium Bromide-Olodaterol (STIOLTO RESPIMAT ) 2.5-2.5 MCG/ACT AERS INHALE 2 PUFFS BY MOUTH INTO THE LUNGS DAILY 4 g 11   No current facility-administered medications for this visit.    PHYSICAL EXAMINATION: ECOG PERFORMANCE STATUS: 1 - Symptomatic but completely  ambulatory VITALS: Vitals:   03/01/24 1123 03/01/24 1126  BP: (!) 142/51 (!) 128/53  Pulse: 78   Resp: 17   Temp: 97.6 F (36.4 C)   SpO2: 96%    Filed Weights   03/01/24 1123  Weight: 169 lb (76.7 kg)   Body mass index is 34.13 kg/m.  GENERAL: alert, in no acute distress and comfortable SKIN: no acute rashes, no significant lesions EYES: conjunctiva are pink and non-injected, sclera anicteric OROPHARYNX: MMM, no exudates, no oropharyngeal erythema or ulceration NECK: supple, no JVD LYMPH:  no palpable lymphadenopathy in the cervical, axillary or inguinal regions [+] small lymph node enlargement, but not notable LUNGS: clear to auscultation b/l with normal respiratory effort HEART: regular rate & rhythm ABDOMEN:  normoactive bowel sounds , non tender, not distended, no hepatosplenomegaly Extremity: no pedal edema PSYCH: alert & oriented x 3 with fluent speech NEURO: no focal motor/sensory deficits  LABORATORY DATA:   I have reviewed the data as listed     Latest Ref Rng & Units 03/01/2024   11:05 AM 12/29/2023    8:43 AM 10/31/2023    9:24 AM  CBC EXTENDED  WBC 4.0 - 10.5 K/uL 7.9  7.7  7.2   RBC 3.87 - 5.11 MIL/uL 3.77  3.88  3.72   Hemoglobin 12.0 - 15.0 g/dL 88.2  87.7  88.1   HCT 36.0 - 46.0 % 35.6  36.5  34.9   Platelets 150 - 400 K/uL 213  231  226   NEUT# 1.7 - 7.7 K/uL 6.1  5.7  5.4   Lymph# 0.7 - 4.0 K/uL 0.6  0.6  0.6          Latest Ref Rng & Units 03/01/2024   11:05 AM 12/29/2023    8:43 AM 11/01/2023    9:33 AM  CMP  Glucose 70 - 99 mg/dL 881  885  863   BUN 8 - 23 mg/dL 24  21  23    Creatinine 0.44 - 1.00 mg/dL 9.10  9.14  9.32   Sodium 135 - 145 mmol/L 139  139  141   Potassium 3.5 - 5.1 mmol/L 3.9  3.8  4.1   Chloride 98 - 111 mmol/L 102  102  102   CO2 22 - 32 mmol/L 30  29  28    Calcium  8.9 - 10.3 mg/dL 9.8  9.5  9.5  Total Protein 6.5 - 8.1 g/dL 6.8  6.8    Total Bilirubin 0.0 - 1.2 mg/dL 0.3  0.3    Alkaline Phos 38 - 126 U/L 42  45     AST 15 - 41 U/L 14  16    ALT 0 - 44 U/L 8  11      Needle core biopsy 07/27/2022:    Breast Ultrasound 07/20/2022:  Ultrasound guided biopsy:  Ultrasound guided biopsy:           RADIOGRAPHIC STUDIES: I have personally reviewed the radiological images as listed and agreed with the findings in the report. No results found.   ASSESSMENT & PLAN:  83 y.o. female with  Stage IV low grade follicular lymphoma incidentally noted LNadenopathy on routine mammogram. History of asymptomatic stroke  Hypertension  Pure hypercholesterolemia  Contusion of right shoulder region  6.  Hx of Acute DVT of left peroneal veins  PLAN: - Discussed lab results on 03/01/2024 in detail with patient: -Blood counts are back and stable -Blood chemistry is pending -Planning to get a PET/CT scan early next year, January or February 2026 -Discussed ceasing Ritugzan infusions depending on the results of her next scan  -1 year of maintenance and will repeat PET/CT scan in one year -RTC for next dose of maintenance Rituxan  in 60 days with labs and MD visit  PET/CT in feb 2026   FOLLOW-UP in 3 months for labs and follow-up with Dr. [name].  The total time spent in the appointment was 15 minutes* .  All of the patient's questions were answered and the patient knows to call the clinic with any problems, questions, or concerns.  [name] [name] MD MS AAHIVMS [facility] Hematology/Oncology Physician [facility]  *Total Encounter Time as defined by the Centers for Medicare and Medicaid Services includes, in addition to the face-to-face time of a patient visit (documented in the note above) non-face-to-face time: obtaining and reviewing outside history, ordering and reviewing medications, tests or procedures, care coordination (communications with other health care professionals or caregivers) and documentation in the medical record.  I, Alan Blowers, acting as a neurosurgeon for [name] [name], MD.,have  documented all relevant documentation on the behalf of [name] [name], MD,as directed by  [name] [name], MD while in the presence of [name] [name], MD.  I have reviewed the above documentation for accuracy and completeness, and I agree with the above.  Gautam Kale, MD

## 2024-03-08 ENCOUNTER — Encounter: Payer: Self-pay | Admitting: Hematology

## 2024-03-09 ENCOUNTER — Other Ambulatory Visit: Payer: Self-pay | Admitting: Hematology

## 2024-03-11 ENCOUNTER — Encounter: Payer: Self-pay | Admitting: Hematology

## 2024-03-15 ENCOUNTER — Other Ambulatory Visit: Payer: Self-pay | Admitting: Family Medicine

## 2024-03-15 DIAGNOSIS — J4489 Other specified chronic obstructive pulmonary disease: Secondary | ICD-10-CM

## 2024-03-27 ENCOUNTER — Other Ambulatory Visit: Payer: Self-pay | Admitting: *Deleted

## 2024-03-27 DIAGNOSIS — M810 Age-related osteoporosis without current pathological fracture: Secondary | ICD-10-CM

## 2024-04-04 ENCOUNTER — Other Ambulatory Visit: Payer: Self-pay

## 2024-04-29 ENCOUNTER — Other Ambulatory Visit: Payer: Self-pay

## 2024-04-29 DIAGNOSIS — C8294 Follicular lymphoma, unspecified, lymph nodes of axilla and upper limb: Secondary | ICD-10-CM

## 2024-04-30 ENCOUNTER — Inpatient Hospital Stay

## 2024-04-30 ENCOUNTER — Other Ambulatory Visit: Payer: Self-pay | Admitting: Family Medicine

## 2024-04-30 ENCOUNTER — Inpatient Hospital Stay: Attending: Hematology | Admitting: Hematology

## 2024-04-30 VITALS — BP 128/52 | HR 89 | Temp 98.0°F | Resp 16

## 2024-04-30 VITALS — BP 127/54 | HR 81 | Temp 97.3°F | Resp 18 | Wt 168.2 lb

## 2024-04-30 DIAGNOSIS — Z7189 Other specified counseling: Secondary | ICD-10-CM

## 2024-04-30 DIAGNOSIS — I639 Cerebral infarction, unspecified: Secondary | ICD-10-CM

## 2024-04-30 DIAGNOSIS — C8294 Follicular lymphoma, unspecified, lymph nodes of axilla and upper limb: Secondary | ICD-10-CM

## 2024-04-30 DIAGNOSIS — I10 Essential (primary) hypertension: Secondary | ICD-10-CM

## 2024-04-30 DIAGNOSIS — Z79899 Other long term (current) drug therapy: Secondary | ICD-10-CM | POA: Diagnosis not present

## 2024-04-30 DIAGNOSIS — Z5112 Encounter for antineoplastic immunotherapy: Secondary | ICD-10-CM | POA: Diagnosis not present

## 2024-04-30 LAB — CMP (CANCER CENTER ONLY)
ALT: 11 U/L (ref 0–44)
AST: 21 U/L (ref 15–41)
Albumin: 4.3 g/dL (ref 3.5–5.0)
Alkaline Phosphatase: 59 U/L (ref 38–126)
Anion gap: 13 (ref 5–15)
BUN: 23 mg/dL (ref 8–23)
CO2: 28 mmol/L (ref 22–32)
Calcium: 10 mg/dL (ref 8.9–10.3)
Chloride: 100 mmol/L (ref 98–111)
Creatinine: 0.83 mg/dL (ref 0.44–1.00)
GFR, Estimated: >60 mL/min
Glucose, Bld: 113 mg/dL — ABNORMAL HIGH (ref 70–99)
Potassium: 4 mmol/L (ref 3.5–5.1)
Sodium: 140 mmol/L (ref 135–145)
Total Bilirubin: 0.4 mg/dL (ref 0.0–1.2)
Total Protein: 7.4 g/dL (ref 6.5–8.1)

## 2024-04-30 LAB — CBC WITH DIFFERENTIAL (CANCER CENTER ONLY)
Abs Immature Granulocytes: 0.07 K/uL (ref 0.00–0.07)
Basophils Absolute: 0.1 K/uL (ref 0.0–0.1)
Basophils Relative: 1 %
Eosinophils Absolute: 0.2 K/uL (ref 0.0–0.5)
Eosinophils Relative: 2 %
HCT: 37.6 % (ref 36.0–46.0)
Hemoglobin: 12.4 g/dL (ref 12.0–15.0)
Immature Granulocytes: 1 %
Lymphocytes Relative: 5 %
Lymphs Abs: 0.4 K/uL — ABNORMAL LOW (ref 0.7–4.0)
MCH: 30.5 pg (ref 26.0–34.0)
MCHC: 33 g/dL (ref 30.0–36.0)
MCV: 92.4 fL (ref 80.0–100.0)
Monocytes Absolute: 1.1 K/uL — ABNORMAL HIGH (ref 0.1–1.0)
Monocytes Relative: 12 %
Neutro Abs: 7.3 K/uL (ref 1.7–7.7)
Neutrophils Relative %: 79 %
Platelet Count: 263 K/uL (ref 150–400)
RBC: 4.07 MIL/uL (ref 3.87–5.11)
RDW: 13.8 % (ref 11.5–15.5)
WBC Count: 9.1 K/uL (ref 4.0–10.5)
nRBC: 0 % (ref 0.0–0.2)

## 2024-04-30 LAB — LACTATE DEHYDROGENASE: LDH: 216 U/L (ref 105–235)

## 2024-04-30 MED ORDER — SODIUM CHLORIDE 0.9 % IV SOLN
375.0000 mg/m2 | Freq: Once | INTRAVENOUS | Status: AC
  Administered 2024-04-30: 700 mg via INTRAVENOUS
  Filled 2024-04-30: qty 20

## 2024-04-30 MED ORDER — METHYLPREDNISOLONE SODIUM SUCC 125 MG IJ SOLR
125.0000 mg | Freq: Once | INTRAMUSCULAR | Status: AC
  Administered 2024-04-30: 125 mg via INTRAVENOUS
  Filled 2024-04-30: qty 2

## 2024-04-30 MED ORDER — SODIUM CHLORIDE 0.9 % IV SOLN: Freq: Once | INTRAVENOUS | Status: AC

## 2024-04-30 MED ORDER — FAMOTIDINE IN NACL 20-0.9 MG/50ML-% IV SOLN
20.0000 mg | Freq: Once | INTRAVENOUS | Status: AC
  Administered 2024-04-30: 20 mg via INTRAVENOUS
  Filled 2024-04-30: qty 50

## 2024-04-30 MED ORDER — CETIRIZINE HCL 10 MG/ML IV SOLN
10.0000 mg | Freq: Once | INTRAVENOUS | Status: AC
  Administered 2024-04-30: 10 mg via INTRAVENOUS
  Filled 2024-04-30: qty 1

## 2024-04-30 MED ORDER — MONTELUKAST SODIUM 10 MG PO TABS
10.0000 mg | ORAL_TABLET | Freq: Once | ORAL | Status: AC
  Administered 2024-04-30: 10 mg via ORAL
  Filled 2024-04-30: qty 1

## 2024-04-30 MED ORDER — ACETAMINOPHEN 500 MG PO TABS
1000.0000 mg | ORAL_TABLET | Freq: Once | ORAL | Status: AC
  Administered 2024-04-30: 1000 mg via ORAL
  Filled 2024-04-30: qty 2

## 2024-04-30 NOTE — Patient Instructions (Signed)
 [facility] - A DEPT OF [facility]  Discharge Instructions: Thank you for choosing [facility] to provide your oncology and hematology care.   If you have a lab appointment with the Cancer Center, please go directly to the Cancer Center and check in at the registration area.   Wear comfortable clothing and clothing appropriate for easy access to any Portacath or PICC line.   We strive to give you quality time with your provider. You may need to reschedule your appointment if you arrive late (15 or more minutes).  Arriving late affects you and other patients whose appointments are after yours.  Also, if you miss three or more appointments without notifying the office, you may be dismissed from the clinic at the provider's discretion.      For prescription refill requests, have your pharmacy contact our office and allow 72 hours for refills to be completed.    Today you received the following chemotherapy and/or immunotherapy agents: Rituximab-pvvr (Ruxience)    To help prevent nausea and vomiting after your treatment, we encourage you to take your nausea medication as directed.  BELOW ARE SYMPTOMS THAT SHOULD BE REPORTED IMMEDIATELY: *FEVER GREATER THAN 100.4 F (38 C) OR HIGHER *CHILLS OR SWEATING *NAUSEA AND VOMITING THAT IS NOT CONTROLLED WITH YOUR NAUSEA MEDICATION *UNUSUAL SHORTNESS OF BREATH *UNUSUAL BRUISING OR BLEEDING *URINARY PROBLEMS (pain or burning when urinating, or frequent urination) *BOWEL PROBLEMS (unusual diarrhea, constipation, pain near the anus) TENDERNESS IN MOUTH AND THROAT WITH OR WITHOUT PRESENCE OF ULCERS (sore throat, sores in mouth, or a toothache) UNUSUAL RASH, SWELLING OR PAIN  UNUSUAL VAGINAL DISCHARGE OR ITCHING   Items with * indicate a potential emergency and should be followed up as soon as possible or go to the Emergency Department if any problems should occur.  Please show the CHEMOTHERAPY ALERT CARD or  IMMUNOTHERAPY ALERT CARD at check-in to the Emergency Department and triage nurse.  Should you have questions after your visit or need to cancel or reschedule your appointment, please contact [facility] - A DEPT OF [facility]  Dept: [phone]  and follow the prompts.  Office hours are 8:00 a.m. to 4:30 p.m. Monday - Friday. Please note that voicemails left after 4:00 p.m. may not be returned until the following business day.  We are closed weekends and major holidays. You have access to a nurse at all times for urgent questions. Please call the main number to the clinic Dept: [phone] and follow the prompts.   For any non-urgent questions, you may also contact your provider using MyChart. We now offer e-Visits for anyone 80 and older to request care online for non-urgent symptoms. For details visit [email or web address].   Also download the MyChart app! Go to the app store, search "MyChart", open the app, select [place], and log in with your MyChart username and password.

## 2024-04-30 NOTE — Progress Notes (Signed)
 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  HISTORY OF PRESENTING ILLNESS: Heather Andrews is a wonderful 84 y.o. female who has been referred to us  by [name], MD for evaluation and management of newly-diagnosed low-grade follicular lymphoma.    Today, she is accompanied by her daughter. She reports that she has had right LE edema for about 6 months. Upon examination, patient has bilateral LE edema, right more than left.    She also reports having a rash at one point. Patient denies any medication changes, long distance travel, or unusual activity. Patient denies any redness/pain in the area.  Patient denies back pain, recent infections, abdominal pain, change in bowel habits, fever, chills, or night sweats.   She does report that she tripped and suffered a fall in January 2024, causing her to hit the side of her head.   Patient denies any breast discomfort or swelling besides in her LE. She does however sometimes experience tingling in her hands.   Patient does confirm having a silent stroke at some point previously, but is unsure of when it occurred.    Patient does report that she is slowing down in general, which she attributes to her age. Patient denies any sudden new fatigue or other major limiting medical issues at this time. Patient does regualrly take vitamin D , but is unsure of how many units she takes. She also takes vitamin K. Patient denies any concern for varicose veins in the past. Patient reports that she will be traveling to [facility] for a week and will leave May 4th.   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 female who is here today for continued evaluation and management of her follicular lymphoma. Today she is accompanied by her daughter and is ambulating with a wheel chair.   she was last seen by me on 03/01/2024; at the time she did not have any concerns and was doing well.   Today, she reports an increase in night sweats. She expresses needing to change her clothing in the middle of the night. She endorses some fatigue as well.   She denies appetite changes, and reports to be well hydrated. She denies enlarged lymph nodes.   She denies toxicities to last Rituximab  treatment.  She denies any new infection issues, unexpected weight change, back pain, abdominal pain, new lumps/bumps, and leg swelling.    REVIEW OF SYSTEMS:   10 Point review of systems of done and is negative except as noted above.  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 Fontaine, OBGYN).   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   3 children: [name]; [name], [name]   Activities: reading, interacting with her two cats, playing cards, corresponding with others (email, snail mail), needlework, cooking   Patient indicated on Geri Assessment questionnaire that she would want her daughter, [name] to make medical decisions on her behalf should she be unable. [phone]).  The patient gives permission to speak to Ms Romnet about issue around the patient's health.      Patient lives in a townhouse.  13 steps to get into home.  Home has two levels.    Assists Devices in home: Grab Bars   Regular exercise: Yoga once a week   Transportation: Patient drives    SOCIAL DRIVERS OF HEALTH SDOH Screenings   Food Insecurity: No Food Insecurity (01/01/2024)  Housing: Low Risk (01/01/2024)  Transportation Needs: No Transportation Needs (01/01/2024)  Utilities: Not At Risk (01/01/2024)  Alcohol Screen: Low Risk (01/01/2024)  Depression (PHQ2-9): Low Risk (03/01/2024)  Financial Resource Strain: Low Risk (01/01/2024)  Physical Activity: Insufficiently Active (01/01/2024)  Social Connections: Moderately Integrated (01/01/2024)  Stress: No Stress Concern Present (01/01/2024)  Tobacco Use: Medium Risk (01/02/2024)  Health Literacy: Adequate Health Literacy (01/01/2024)     FAMILY HISTORY Family History  Problem Relation Age of Onset   Hypertension Mother    Heart disease Paternal Grandmother    Heart disease Paternal Grandfather    Cancer Brother        Leukemia     ALLERGIES: is allergic to lisinopril, ephedrine-guaifenesin, and rituximab .  MEDICATIONS   Current Outpatient Medications  Medication Sig Dispense Refill   albuterol  (VENTOLIN  HFA) 108 (90 Base) MCG/ACT inhaler Inhale 2 puffs into the lungs every 6 (six) hours as needed for wheezing or shortness of breath. 18 g 5   calcium  carbonate (OSCAL) 1500 (600 Ca) MG TABS tablet Take 600 mg by mouth 2 (two) times daily.     Cholecalciferol (VITAMIN D3) 125 MCG (5000 UT) CAPS Take 125 mcg by mouth daily.     Cyanocobalamin (VITAMIN B-12 PO) Take 1 tablet by mouth daily.     denosumab  (PROLIA ) 60 MG/ML SOSY injection Inject 60 mg into the skin every 6 (six) months.     diclofenac  Sodium (VOLTAREN ) 1 % GEL Apply 2 g topically 4 (four) times daily. 50 g 2   ELIQUIS  5 MG TABS tablet TAKE 1 TABLET BY MOUTH TWICE A DAY 60 tablet 4   GEMTESA 75 MG TABS Take 1 tablet by mouth daily.     hydrochlorothiazide  (HYDRODIURIL ) 25 MG tablet TAKE 1 TABLET (25 MG TOTAL) BY MOUTH DAILY. 90 tablet  3   rosuvastatin  (CRESTOR ) 20 MG tablet TAKE 1 TABLET BY MOUTH EVERY DAY 90 tablet 3   Tiotropium Bromide-Olodaterol (STIOLTO RESPIMAT ) 2.5-2.5 MCG/ACT AERS Inhale 1 Inhalation into the lungs daily. 4 g 11   No current facility-administered medications for this visit.    PHYSICAL EXAMINATION: ECOG PERFORMANCE STATUS: 1 - Symptomatic but completely ambulatory VITALS: Vitals:   04/30/24 0938  BP: (!) 127/54  Pulse: 81  Resp: 18  Temp: (!) 97.3 F (36.3 C)  SpO2: 94%   Filed Weights   04/30/24 0938  Weight: 168 lb 3.2 oz (76.3 kg)   Body mass index is 33.97 kg/m.  GENERAL: alert, in no acute distress and comfortable SKIN: no acute rashes, no significant lesions EYES: conjunctiva are pink and non-injected, sclera anicteric OROPHARYNX: MMM, no exudates, no oropharyngeal erythema or ulceration NECK: supple, no JVD LYMPH:  no palpable lymphadenopathy in the cervical, axillary or inguinal regions LUNGS: clear to auscultation b/l with normal respiratory effort HEART: regular rate & rhythm ABDOMEN:   normoactive bowel sounds , non tender, not distended, no hepatosplenomegaly Extremity: no pedal edema PSYCH: alert & oriented x 3 with fluent speech NEURO: no focal motor/sensory deficits  LABORATORY DATA:   I have reviewed the data as listed     Latest Ref Rng & Units 04/30/2024    9:19 AM 03/01/2024   11:05 AM 12/29/2023    8:43 AM  CBC EXTENDED  WBC 4.0 - 10.5 K/uL 9.1  7.9  7.7   RBC 3.87 - 5.11 MIL/uL 4.07  3.77  3.88   Hemoglobin 12.0 - 15.0 g/dL 87.5  88.2  87.7   HCT 36.0 - 46.0 % 37.6  35.6  36.5   Platelets 150 - 400 K/uL 263  213  231   NEUT# 1.7 - 7.7 K/uL 7.3  6.1  5.7   Lymph# 0.7 - 4.0 K/uL 0.4  0.6  0.6        Latest Ref Rng & Units 04/30/2024    9:19 AM 03/01/2024   11:05 AM 12/29/2023    8:43 AM  CMP  Glucose 70 - 99 mg/dL 886  881  885   BUN 8 - 23 mg/dL 23  24  21    Creatinine 0.44 - 1.00 mg/dL 9.16  9.10  9.14   Sodium 135 - 145 mmol/L 140  139  139   Potassium 3.5 - 5.1 mmol/L 4.0  3.9  3.8   Chloride 98 - 111 mmol/L 100  102  102   CO2 22 - 32 mmol/L 28  30  29    Calcium  8.9 - 10.3 mg/dL 89.9  9.8  9.5   Total Protein 6.5 - 8.1 g/dL 7.4  6.8  6.8   Total Bilirubin 0.0 - 1.2 mg/dL 0.4  0.3  0.3   Alkaline Phos 38 - 126 U/L 59  42  45   AST 15 - 41 U/L 21  14  16    ALT 0 - 44 U/L 11  8  11      Needle core biopsy 07/27/2022:    Breast Ultrasound 07/20/2022:  Ultrasound guided biopsy:  Ultrasound guided biopsy:         RADIOGRAPHIC STUDIES: I have personally reviewed the radiological images as listed and agreed with the findings in the report. No results found.  ASSESSMENT & PLAN:  84 y.o. female with  Stage IV low grade follicular lymphoma incidentally noted LNadenopathy on routine mammogram. History of asymptomatic stroke  Hypertension  Pure hypercholesterolemia  Contusion of right shoulder region  6.  Hx of Acute DVT of left peroneal veins   PLAN: - Discussed lab results on 04/30/2024 in detail with patient: -CBC is  normal -Hemoglobin at 12 -Lymphocytes running low, but this is expected with Rituximab   Counseled to take influenza vaccine  No notable toxicity from previous dose of maintenance Rituximab  Appropriate to proceed with Rituximab  today PET scan before March 2026 for continued Rituximab  maintenance Pet/ct in 6 weeks RTC with Dr [name] with labs and next dose of maintenance Rituximab  in 60 days  FOLLOW-UP Pet/ct in 6 weeks RTC with Dr [name] with labs and next dose of maintenance Rituximab  in 60 days   The total time spent in the appointment was 30 minutes* .  All of the patient's questions were answered and the patient knows to call the clinic with any problems, questions, or concerns.  [name] [name] MD MS AAHIVMS [facility] Hematology/Oncology Physician [facility]  *Total Encounter Time as defined by the Centers for Medicare and Medicaid Services includes, in addition to the face-to-face time of a patient visit (documented in the note above) non-face-to-face time: obtaining and reviewing outside history, ordering and reviewing medications, tests or procedures, care coordination (communications with other health care professionals or caregivers) and documentation in the medical record.  I, Alan Blowers, acting as a neurosurgeon for [name] [name], MD.,have documented all relevant documentation on the behalf of [name] [name], MD,as directed by  [name] [name], MD while in the presence of [name] [name], MD.  I have reviewed the above documentation for accuracy and completeness, and I agree with the above.  [name] [name], MD     [1]  Social History Tobacco Use   Smoking status: Former    Current packs/day: 0.50    Average packs/day: 0.5 packs/day for 68.0 years (34.0 ttl pk-yrs)    Types: Cigarettes    Start date: 04/1956   Smokeless tobacco: Never  Vaping Use   Vaping status: Never Used  Substance Use Topics   Alcohol use: Yes    Comment: occassionally   Drug use: No   "

## 2024-05-06 ENCOUNTER — Other Ambulatory Visit: Payer: Self-pay | Admitting: Family Medicine

## 2024-05-06 ENCOUNTER — Other Ambulatory Visit: Payer: Self-pay

## 2024-05-06 ENCOUNTER — Encounter: Payer: Self-pay | Admitting: Hematology

## 2024-05-06 ENCOUNTER — Other Ambulatory Visit (HOSPITAL_COMMUNITY): Payer: Self-pay

## 2024-05-07 ENCOUNTER — Other Ambulatory Visit: Payer: Self-pay | Admitting: Family Medicine

## 2024-05-07 ENCOUNTER — Other Ambulatory Visit: Payer: Self-pay

## 2024-05-07 MED ORDER — PROLIA 60 MG/ML ~~LOC~~ SOSY
60.0000 mg | PREFILLED_SYRINGE | Freq: Once | SUBCUTANEOUS | 0 refills | Status: AC
  Filled 2024-05-07: qty 1, 1d supply, fill #0
  Filled 2024-05-08: qty 1, 180d supply, fill #0

## 2024-05-08 ENCOUNTER — Other Ambulatory Visit: Payer: Self-pay

## 2024-05-08 ENCOUNTER — Other Ambulatory Visit: Payer: Self-pay | Admitting: *Deleted

## 2024-05-08 DIAGNOSIS — M810 Age-related osteoporosis without current pathological fracture: Secondary | ICD-10-CM

## 2024-05-08 NOTE — Progress Notes (Signed)
 Specialty Pharmacy Refill Coordination Note  Heather Andrews is a 84 y.o. female contacted today regarding refills of specialty medication(s) Denosumab  (Prolia )   Patient requested Courier to Provider Office   Delivery date: 05/09/24   [facility]   Medication will be filled on: 05/08/24  Patient provided cc and approved $50 copayment

## 2024-05-16 ENCOUNTER — Ambulatory Visit: Admitting: Family Medicine

## 2024-05-17 ENCOUNTER — Other Ambulatory Visit: Payer: Self-pay | Admitting: Family Medicine

## 2024-05-18 LAB — VITAMIN D 25 HYDROXY (VIT D DEFICIENCY, FRACTURES): Vit D, 25-Hydroxy: 53 ng/mL (ref 30–100)

## 2024-05-23 ENCOUNTER — Ambulatory Visit: Admitting: Family Medicine

## 2024-05-24 ENCOUNTER — Ambulatory Visit: Admitting: Family Medicine

## 2024-05-24 VITALS — Ht 59.0 in

## 2024-05-24 DIAGNOSIS — M810 Age-related osteoporosis without current pathological fracture: Secondary | ICD-10-CM

## 2024-05-24 MED ORDER — DENOSUMAB 60 MG/ML ~~LOC~~ SOSY
60.0000 mg | PREFILLED_SYRINGE | Freq: Once | SUBCUTANEOUS | Status: AC
  Administered 2024-05-24: 60 mg via SUBCUTANEOUS

## 2024-05-24 NOTE — Progress Notes (Unsigned)
 Patient given [place] prolia  injection 60mg /ml in the right arm. Patient provided medicine via the [facility]. Patient tolerated injection well without reaction at the injection site. Patient will schedule next injection, which is 6 months from today.

## 2024-06-10 ENCOUNTER — Encounter (HOSPITAL_COMMUNITY)

## 2024-06-27 ENCOUNTER — Encounter: Admitting: Family Medicine

## 2024-06-28 ENCOUNTER — Inpatient Hospital Stay: Attending: Hematology | Admitting: Hematology

## 2024-06-28 ENCOUNTER — Inpatient Hospital Stay

## 2024-11-22 ENCOUNTER — Ambulatory Visit: Admitting: Family Medicine

## 2025-01-02 ENCOUNTER — Encounter
